# Patient Record
Sex: Female | Born: 1980
Health system: Southern US, Community
[De-identification: ages and names within clinical notes are randomized; demographics above are authoritative.]

## PROBLEM LIST (undated history)

## (undated) DIAGNOSIS — I10 Essential (primary) hypertension: Secondary | ICD-10-CM

## (undated) DIAGNOSIS — R7303 Prediabetes: Secondary | ICD-10-CM

## (undated) DIAGNOSIS — E559 Vitamin D deficiency, unspecified: Secondary | ICD-10-CM

## (undated) DIAGNOSIS — G43909 Migraine, unspecified, not intractable, without status migrainosus: Secondary | ICD-10-CM

## (undated) HISTORY — DX: Vitamin D deficiency, unspecified: E55.9

## (undated) HISTORY — DX: Prediabetes: R73.03

## (undated) HISTORY — DX: Migraine, unspecified, not intractable, without status migrainosus: G43.909

---

## 2002-05-21 ENCOUNTER — Inpatient Hospital Stay (HOSPITAL_COMMUNITY): Admission: AD | Admit: 2002-05-21 | Discharge: 2002-05-21 | Payer: Self-pay | Admitting: Obstetrics and Gynecology

## 2002-10-06 ENCOUNTER — Encounter: Payer: Self-pay | Admitting: Obstetrics and Gynecology

## 2002-10-06 ENCOUNTER — Ambulatory Visit (HOSPITAL_COMMUNITY): Admission: RE | Admit: 2002-10-06 | Discharge: 2002-10-06 | Payer: Self-pay | Admitting: Obstetrics and Gynecology

## 2002-11-21 ENCOUNTER — Inpatient Hospital Stay (HOSPITAL_COMMUNITY): Admission: AD | Admit: 2002-11-21 | Discharge: 2002-11-24 | Payer: Self-pay | Admitting: Obstetrics and Gynecology

## 2002-11-21 ENCOUNTER — Encounter (INDEPENDENT_AMBULATORY_CARE_PROVIDER_SITE_OTHER): Payer: Self-pay

## 2003-02-21 ENCOUNTER — Other Ambulatory Visit: Admission: RE | Admit: 2003-02-21 | Discharge: 2003-02-21 | Payer: Self-pay | Admitting: Obstetrics and Gynecology

## 2004-03-16 ENCOUNTER — Emergency Department (HOSPITAL_COMMUNITY): Admission: EM | Admit: 2004-03-16 | Discharge: 2004-03-16 | Payer: Self-pay | Admitting: Emergency Medicine

## 2004-06-12 ENCOUNTER — Other Ambulatory Visit: Admission: RE | Admit: 2004-06-12 | Discharge: 2004-06-12 | Payer: Self-pay | Admitting: Obstetrics and Gynecology

## 2004-10-30 ENCOUNTER — Other Ambulatory Visit: Admission: RE | Admit: 2004-10-30 | Discharge: 2004-10-30 | Payer: Self-pay | Admitting: Family Medicine

## 2005-07-02 ENCOUNTER — Other Ambulatory Visit: Admission: RE | Admit: 2005-07-02 | Discharge: 2005-07-02 | Payer: Self-pay | Admitting: Obstetrics and Gynecology

## 2007-04-20 ENCOUNTER — Other Ambulatory Visit: Admission: RE | Admit: 2007-04-20 | Discharge: 2007-04-20 | Payer: Self-pay | Admitting: Family Medicine

## 2008-08-31 ENCOUNTER — Emergency Department (HOSPITAL_COMMUNITY): Admission: EM | Admit: 2008-08-31 | Discharge: 2008-08-31 | Payer: Self-pay | Admitting: Emergency Medicine

## 2009-05-09 ENCOUNTER — Other Ambulatory Visit: Admission: RE | Admit: 2009-05-09 | Discharge: 2009-05-09 | Payer: Self-pay | Admitting: Family Medicine

## 2010-02-02 ENCOUNTER — Encounter: Payer: Self-pay | Admitting: Otolaryngology

## 2010-03-31 ENCOUNTER — Other Ambulatory Visit (HOSPITAL_COMMUNITY)
Admission: RE | Admit: 2010-03-31 | Discharge: 2010-03-31 | Disposition: A | Discharge status: Home / Self Care | Payer: Managed Care, Other (non HMO) | Source: Ambulatory Visit | Attending: Internal Medicine | Admitting: Internal Medicine

## 2010-03-31 ENCOUNTER — Other Ambulatory Visit: Payer: Self-pay | Admitting: Physician Assistant

## 2010-03-31 DIAGNOSIS — Z01419 Encounter for gynecological examination (general) (routine) without abnormal findings: Secondary | ICD-10-CM | POA: Insufficient documentation

## 2010-04-19 LAB — URINE MICROSCOPIC-ADD ON

## 2010-04-19 LAB — URINALYSIS, ROUTINE W REFLEX MICROSCOPIC
Bilirubin Urine: NEGATIVE
Glucose, UA: NEGATIVE mg/dL
Leukocytes, UA: NEGATIVE
Protein, ur: NEGATIVE mg/dL
pH: 7.5 (ref 5.0–8.0)

## 2010-04-19 LAB — POCT PREGNANCY, URINE: Preg Test, Ur: NEGATIVE

## 2010-05-30 NOTE — Op Note (Signed)
Christine Fields, Christine Fields                         ACCOUNT NO.:  [id number]   MEDICAL RECORD NO.:  [id number]                   PATIENT TYPE:  INP   LOCATION:  9106                                 [facility]   PHYSICIAN:  Osborn Coho, M.D.                DATE OF BIRTH:  1980/10/25   DATE OF PROCEDURE:  11/21/2002  DATE OF DISCHARGE:                                 OPERATIVE REPORT   PREOPERATIVE DIAGNOSES:  1. Term intrauterine pregnancy.  2. Borderline gestational diabetes.  3. Unexplained fetal tachycardia with decreased variability.   POSTOPERATIVE DIAGNOSES:  1. Term intrauterine pregnancy.  2. Borderline gestational diabetes.  3. Unexplained fetal tachycardia with decreased variability.   1. Presentation left occiput transverse.   PROCEDURE:  Primary low transverse cesarean section via Pfannenstiel skin  incision.   ANESTHESIA:  Epidural.   SURGEON:  Osborn Coho, M.D.   ASSISTANT:  Renaldo Reel. Emilee Hero, C.N.M.   FLUIDS:  2000 mL.   URINE OUTPUT:  300 mL.   ESTIMATED BLOOD LOSS:  800 mL.   COMPLICATIONS:  None.   FINDINGS:  Live female infant with Apgars of 8 at one minute and 9 at five  minutes, [place].  Cord gases 7.28 venous and 7.23 arterial.  Placenta to pathology.   DESCRIPTION OF PROCEDURE:  The patient was taken to the operating room after  risks, benefits, and alternatives were discussed with the patient.  The  patient verbalized understanding and consent signed and witnessed.  The  patient was given a surgical level via the epidural.  The patient was  prepped and draped in the normal sterile fashion.  A Pfannenstiel skin  incision was made and carried down to the underlying layer of fascia with  the Bovie.  The fascia was excised bilaterally in the midline with the Bovie  and the fascial incision extended bilaterally with the Mayo scissors.  Straight Kocher clamps were placed on the superior aspect of the fascial  incision and the rectus  muscle excised from the fascia.  Same was done on  the inferior aspect of the fascial incision.  The muscle was separated in  the midline and the peritoneum entered bluntly.  A bladder flap was created  with the Metzenbaum scissors and bladder retractor placed.  Uterine incision  was made with the scalpel and extended bilaterally with bandage scissors.  The infant was found to be in the left occiput transverse position.  There  was no nuchal cord.  There was still no fluid returning.  The infant's head  was delivered and the oropharynx and nasopharynx were  bulb suctioned.  The  remainder of the infant was then delivered.  The cord was clamped and cut  and the infant was handed to the awaiting pediatricians.  Cord gases were  sent.  Cord bloods were collected.  The placenta was removed via fundal  massage and cefoxitin administered after delivery  of the infant.  The uterus  was exteriorized and cleared of all clots and debris.  The uterine incision  was repaired with 0 Vicryl in a running locked fashion.  A second  imbricating layer was performed.  The uterus was returned to the intra-  abdominal cavity after noting normal bilateral ovaries and fallopian tubes.  The intra-abdominal cavity was irrigated and gutters cleared of any clots  and debris.  The peritoneum was closed with 3-0 chromic after the uterine  incision was noted to be hemostatic.  The fascia was repaired with 0 Vicryl  in a running fashion.  The subcutaneous tissue was irrigated and made  hemostatic with the Bovie.  The skin was closed with staples.  Sponge, lap  and needle counts were correct.  The patient tolerated the procedure well  and was returned to the recovery room in stable condition.                                               Osborn Coho, M.D.    AR/MEDQ  D:  11/21/2002  T:  11/21/2002  Job:  [id number]

## 2010-05-30 NOTE — Discharge Summary (Signed)
NAMEELERI, RUBEN                         ACCOUNT NO.:  [id number]   MEDICAL RECORD NO.:  [id number]                   PATIENT TYPE:  INP   LOCATION:  9106                                 [facility]   PHYSICIAN:  Naima A. Dillard, M.D.              DATE OF BIRTH:  [birth year]   DATE OF ADMISSION:  11/21/2002  DATE OF DISCHARGE:  11/24/2002                                 DISCHARGE SUMMARY   ADMITTING DIAGNOSES:  1. Intrauterine pregnancy at 40-5/7 weeks.  2. Glucose intolerance.  3. Favorable cervix.   DISCHARGE DIAGNOSES:  1. Term intrauterine pregnancy.  2. Borderline gestational diabetes.  3. Unexplained fetal tachycardia with decreased variability.  4. Left occiput transverse.   PROCEDURE:  Primary low transverse cesarean section.   HOSPITAL COURSE:  Ms. Wilfrid Lund is a 30 year old gravida 2, para 0-0-1-0 at 54-  5/7 weeks who presented for induction secondary to borderline gestational  diabetes and favorable cervix on November 21, 2002.  She had an abnormal  value on 1-hour GGT and an abnormal value on a 3-hour GGT with intermittent  elevations of 2-hour postprandial.  She had not monitored diet or CBGs as  instructed during her pregnancy.  Pregnancy was remarkable for (1) glucose  intolerance, (2) questionable last menstrual period.  The patient was  admitted on the morning of November 21, 2002 per Dr. Su Hilt.  Pitocin was  begun per low dose protocol.  Her CBG that morning was 141 after a breakfast  of Frosted Flakes and orange juice.  Artificial rupture of membranes was  accomplished at 11:40 a.m. with no fluid noted.  Cervix was 4, 90%, vertex,  at a -2.  CBG was 104.  Fetal heart rate was showing a baseline of 150s-160s  with decreased long-term variability, some mild to moderate decels after the  epidural which resolved.  Pitocin was held at that time secondary to decel.  Once the decel resolved and observation occurred then the Pitocin was  restarted.  She did  have an epidural.  She did have continued episodes of  decreased beat-to-beat variability with slight fetal tachycardia.  By 1 p.m.  the infant was having some episodes of persistent fetal tachycardia and then  some deceleration, no cervical change was noted, and the plan was made to  proceed with C-section.  Patient was taken to the operating room where a  primary low transverse cesarean section was performed by Dr. Su Hilt under  epidural anesthesia.  Findings were a viable female by the name of Cristal Generous.  Infant weighed 8 pounds 5 ounces.  Placenta was sent to pathology.  Cord pH venous was 7.28, arterial was 7.23.  Apgars were 8 and 9.  CBG prior  to C-section was 118.  By postop day #1 patient was doing well, her  hemoglobin was 10.4 down from 12.4, her incision was clean, dry, and intact,  and she was doing  well.  She was breast-feeding.  She was considering an IUD  for contraception.  The rest of her hospital course was uncomplicated.  By  postop day #3, November 24, 2002, she was deemed to have received full  benefit of her hospital stay and was discharged home.   DISCHARGE INSTRUCTIONS:  Per [facility] handout.   DISCHARGE MEDICATIONS:  1. Motrin 600 mg p.o. q.6h. p.r.n. pain.  2. Tylox one to two p.o. q.3-4h. p.r.n. pain.   DISCHARGE FOLLOWUP:  Discharge followup will occur in 6 weeks [facility].  Patient be interested in IUD at that point.     Renaldo Reel Emilee Hero, C.N.M.                   Naima A. Normand Sloop, M.D.    Leeanne Mannan  D:  11/24/2002  T:  11/24/2002  Job:  [id number]

## 2010-05-30 NOTE — H&P (Signed)
NAMEVERDEAN, MURIN NO.:  [id number]   MEDICAL RECORD NO.:  [id number]                   PATIENT TYPE:  INP   LOCATION:  9168                                 [facility]   PHYSICIAN:  Osborn Coho, M.D.                DATE OF BIRTH:  25-Feb-1980   DATE OF ADMISSION:  11/21/2002  DATE OF DISCHARGE:                                HISTORY & PHYSICAL   Ms. Welge is a 30 year old gravida 2, para 0-0-1-0, at 40-5/7 weeks, who  presents for induction secondary to borderline gestational diabetes and  favorable cervix.  She had an abnormal value on her one-hour GTT and an  abnormal value on her three-hour GTT with intermittent elevations of two-  hour postprandials.  She has not monitored her diet or CBGs as instructed.  Her pregnancy has been remarkable for:   1. Glucose intolerance.  2. Questionable LMP.    PRENATAL LABORATORY DATA:  Blood type is O positive, Rh antibody negative.  VDRL nonreactive.  Rubella titer positive.  Hepatitis B surface antigen  negative.  HIV nonreactive.  Sickle cell test was negative.  GC and  Chlamydia cultures were negative.  Pap was normal.  Glucose challenge was  elevated.  Three-hour GTT had one abnormal value.  AFP was normal.  Hemoglobin upon entering the practice was 12.2.  It was within normal limits  at 27 weeks.  EDC of November 16, 2002, was established by ultrasound at nine  weeks secondary to questionable LMP.  Group B strep culture was negative at  36 weeks as well as cultures were negative.   HISTORY OF PRESENT PREGNANCY:  The patient entered care at approximately 10  weeks.  She was treated for BV at that visit.  She had nausea and vomiting  in early pregnancy, for which she was given Phenergan.  She was placed on  Reglan when Phenergan became ineffective.  She had some spotting at 14  weeks.  She had an ultrasound that showed cervical length adequate.  She had  an ultrasound at 18 weeks that showed  lower uterine segment with an amniotic  band, anatomy normal except an isolated echogenic intracardiac focus.  The  amniotic band was shown not to be interfering with the baby's growth, and no  follow-up was necessary.  She had an elevated one-hour GTT and then had a  three-hour GTT that had an abnormal value.  She did not monitor her sugars  or monitor her diet.  She had one elevated two-hour postprandial and then  had normal postprandials when checked in the office.  Her cervix on exam  yesterday in the office is 3, 90%, vertex at a -2.  The decision was made to  admit her for induction.   PAST OBSTETRICAL HISTORY:  In 1998 she had a therapeutic termination of  pregnancy without problems.   PAST MEDICAL HISTORY:  She was on  Ortho Tri-Cyclen until November 2003.  She  had one UTI when she was in high school.  She has no surgical history.   She has no known medication allergy.   FAMILY HISTORY:  Her paternal grandmother had a heart attack.  She does have  hypertension on both sides of her family and diabetes on both sides of her  family.  Maternal grandfather had oral cancer as a smoker.  Her father had a  stroke.   GENETIC HISTORY:  Unremarkable.  Mother and sister are also smokers.   SOCIAL HISTORY:  The patient is engaged to the father of the baby.  His name  is [employer].  The patient is Hispanic.  Her partner is in [place] in  [employer].  She has two years of college.  She is employed as a  Training and development officer.  Her partner is in [employer].  She denies any alcohol, drug,  or tobacco use during this pregnancy.   PHYSICAL EXAMINATION:  VITAL SIGNS:  Stable.  Initial blood pressure was  130/90, follow-up blood pressure was within normal limits.  The patient is  afebrile.  HEENT:  Within normal limits.  CHEST:  Bilateral breath sounds are clear.  CARDIAC:  Regular rate and rhythm without murmur.  BREASTS:  Soft and nontender.  ABDOMEN:  Fundal height is approximately 38  cm.  Estimated fetal weight is 7-  8 pounds.  Uterine contractions are every six to eight minutes, mild  quality.  Fetal heart rate shows the baseline in the 155-160 with overall  decreased variability.  It is nonreactive, but there are no decelerations.  PELVIC:  Cervix per Dr. Su Hilt on November 20, 2002, was 3, 90%, vertex at a  -2 station.  EXTREMITIES:  Deep tendon reflexes are 2+ without clonus.  There is trace  edema noted.   IMPRESSION:  1. Intrauterine pregnancy at 40-2/7 weeks.  2. Glucose intolerance.  3. Favorable cervix.  4. Currently nonreactive fetal heart rate tracing.   PLAN:  1. Admit to birthing suite per consult with Dr. Su Hilt as attending     physician.  2. Routine physician orders.  3. Plan CBG evaluation with monitoring as needed based upon that.  4. Pitocin per low-dose protocol.  5. M.D. will follow.     Renaldo Reel Emilee Hero, C.N.M.                   Osborn Coho, M.D.    VLL/MEDQ  D:  11/21/2002  T:  11/21/2002  Job:  [id number]

## 2011-05-16 ENCOUNTER — Emergency Department (HOSPITAL_COMMUNITY)
Admission: EM | Admit: 2011-05-16 | Discharge: 2011-05-17 | Disposition: A | Discharge status: Home / Self Care | Payer: PRIVATE HEALTH INSURANCE | Attending: Emergency Medicine | Admitting: Emergency Medicine

## 2011-05-16 ENCOUNTER — Emergency Department (HOSPITAL_COMMUNITY): Payer: PRIVATE HEALTH INSURANCE

## 2011-05-16 ENCOUNTER — Encounter (HOSPITAL_COMMUNITY): Payer: Self-pay | Admitting: Family Medicine

## 2011-05-16 ENCOUNTER — Other Ambulatory Visit: Payer: Self-pay

## 2011-05-16 DIAGNOSIS — R0602 Shortness of breath: Secondary | ICD-10-CM | POA: Insufficient documentation

## 2011-05-16 DIAGNOSIS — I10 Essential (primary) hypertension: Secondary | ICD-10-CM | POA: Insufficient documentation

## 2011-05-16 DIAGNOSIS — R079 Chest pain, unspecified: Secondary | ICD-10-CM | POA: Insufficient documentation

## 2011-05-16 HISTORY — DX: Essential (primary) hypertension: I10

## 2011-05-16 LAB — CBC
HCT: 40.4 % (ref 36.0–46.0)
Hemoglobin: 13.8 g/dL (ref 12.0–15.0)
MCHC: 34.2 g/dL (ref 30.0–36.0)
MCV: 85.1 fL (ref 78.0–100.0)
RBC: 4.75 MIL/uL (ref 3.87–5.11)

## 2011-05-16 LAB — POCT I-STAT TROPONIN I: Troponin i, poc: 0 ng/mL (ref 0.00–0.08)

## 2011-05-16 LAB — D-DIMER, QUANTITATIVE: D-Dimer, Quant: 0.24 ug/mL-FEU (ref 0.00–0.48)

## 2011-05-16 LAB — BASIC METABOLIC PANEL
CO2: 25 mEq/L (ref 19–32)
GFR calc Af Amer: >90 mL/min (ref >90)
GFR calc non Af Amer: >90 mL/min (ref >90)
Glucose, Bld: 131 mg/dL — ABNORMAL HIGH (ref 70–99)

## 2011-05-16 MED ORDER — ASPIRIN 325 MG PO TABS
324.0000 mg | ORAL_TABLET | ORAL | Status: AC
  Administered 2011-05-16: 324 mg via ORAL
  Filled 2011-05-16: qty 1

## 2011-05-16 MED ORDER — ALBUTEROL SULFATE (5 MG/ML) 0.5% IN NEBU
5.0000 mg | INHALATION_SOLUTION | Freq: Once | RESPIRATORY_TRACT | Status: AC
  Administered 2011-05-16: 5 mg via RESPIRATORY_TRACT
  Filled 2011-05-16: qty 1

## 2011-05-16 MED ORDER — KETOROLAC TROMETHAMINE 30 MG/ML IJ SOLN
30.0000 mg | Freq: Once | INTRAMUSCULAR | Status: DC
  Filled 2011-05-16: qty 1

## 2011-05-16 NOTE — ED Notes (Signed)
Patient states she started having chest pain since last night. States pain started while studying. Describes pain as intermittent.

## 2011-05-16 NOTE — ED Notes (Signed)
Pt presented to the Er with c/o CP that started last night, pain in the "center of my chest", feels like a tightness, intermittent pain, 8/10 at this time

## 2011-05-16 NOTE — ED Notes (Signed)
Pt. Able toambulate to the bathroom without assistance and reported no SOB, or distress upon ambulation.

## 2011-05-16 NOTE — ED Provider Notes (Signed)
History     CSN: [id number]  Arrival date & time 05/16/11  2025   First MD Initiated Contact with Patient 05/16/11 2138      10:31 PM HPI Patient reports substernal chest pain that began yesterday at [facility] eating. Describes pain as a squeezing, tightness. States pain is waxing and waning but constant. Reports been under significant amount of stress. Denies shortness of breath, diaphoresis, nausea, vomiting, abdominal pain, back pain, fever, cough. Patient reports significant history of hypertension.  Patient is a 31 y.o. female presenting with chest pain. The history is provided by the patient.  Chest Pain The chest pain began yesterday. Chest pain occurs constantly. The chest pain is unchanged. The severity of the pain is mild. The quality of the pain is described as pressure-like. The pain does not radiate. Chest pain is worsened by certain positions (laying flat). Primary symptoms include shortness of breath. Pertinent negatives for primary symptoms include no fever, no fatigue, no syncope, no cough, no wheezing, no palpitations, no abdominal pain, no nausea, no vomiting, no dizziness and no altered mental status.  Pertinent negatives for associated symptoms include no diaphoresis, no numbness and no weakness. She tried nothing for the symptoms.  Her past medical history is significant for hypertension.  Pertinent negatives for past medical history include no CAD, no CHF, no diabetes, no DVT, no hyperlipidemia, no MI, no PE and no TIA.     Past Medical History  Diagnosis Date  . Hypertension     History reviewed. No pertinent past surgical history.  No family history on file.  History  Substance Use Topics  . Smoking status: Never Smoker   . Smokeless tobacco: Not on file  . Alcohol Use: No    OB History    Grav Para Term Preterm Abortions TAB SAB Ect Mult Living                  Review of Systems  Constitutional: Negative for fever, diaphoresis and fatigue.  HENT:  Negative for neck pain.   Respiratory: Positive for shortness of breath. Negative for cough and wheezing.   Cardiovascular: Positive for chest pain. Negative for palpitations, leg swelling and syncope.  Gastrointestinal: Negative for nausea, vomiting and abdominal pain.  Neurological: Negative for dizziness, weakness, numbness and headaches.  Psychiatric/Behavioral: Negative for altered mental status.  All other systems reviewed and are negative.    Allergies  Review of patient's allergies indicates no known allergies.  Home Medications   Current Outpatient Rx  Name Route Sig Dispense Refill  . ASPIRIN EFFERVESCENT 325 MG PO TBEF Oral Take 325 mg by mouth every 6 (six) hours as needed.      BP 157/99  Pulse 87  Temp(Src) 98.1 F (36.7 C) (Oral)  Resp 18  SpO2 100%  Physical Exam  Vitals reviewed. Constitutional: She is oriented to person, place, and time. Vital signs are normal. She appears well-developed and well-nourished. No distress.  HENT:  Head: Normocephalic and atraumatic.  Eyes: Conjunctivae are normal. Pupils are equal, round, and reactive to light.  Neck: Normal range of motion. Neck supple.  Cardiovascular: Normal rate, regular rhythm and normal heart sounds.  Exam reveals no gallop and no friction rub.   No murmur heard. Pulmonary/Chest: Effort normal and breath sounds normal. She has no wheezes. She has no rhonchi. She has no rales. She exhibits no tenderness.  Abdominal: Soft. Bowel sounds are normal. She exhibits no distension and no mass. There is no tenderness. There is  no rebound and no guarding.  Musculoskeletal: Normal range of motion.  Neurological: She is alert and oriented to person, place, and time.  Skin: Skin is warm and dry. No rash noted. No erythema. No pallor.  Psychiatric: She has a normal mood and affect. Her behavior is normal.    ED Course  Procedures  Results for orders placed during the hospital encounter of 05/16/11  CBC       Component Value Range   WBC 13.6 (*) 4.0 - 10.5 (K/uL)   RBC 4.75  3.87 - 5.11 (MIL/uL)   Hemoglobin 13.8  12.0 - 15.0 (g/dL)   HCT 16.1  09.6 - 04.5 (%)   MCV 85.1  78.0 - 100.0 (fL)   MCH 29.1  26.0 - 34.0 (pg)   MCHC 34.2  30.0 - 36.0 (g/dL)   RDW 40.9  81.1 - 91.4 (%)   Platelets 241  150 - 400 (K/uL)  BASIC METABOLIC PANEL      Component Value Range   Sodium 136  135 - 145 (mEq/L)   Potassium 3.6  3.5 - 5.1 (mEq/L)   Chloride 101  96 - 112 (mEq/L)   CO2 25  19 - 32 (mEq/L)   Glucose, Bld 131 (*) 70 - 99 (mg/dL)   BUN 9  6 - 23 (mg/dL)   Creatinine, Ser 7.82  0.50 - 1.10 (mg/dL)   Calcium 9.3  8.4 - 95.6 (mg/dL)   GFR calc non Af Amer >90  >90 (mL/min)   GFR calc Af Amer >90  >90 (mL/min)  TROPONIN I      Component Value Range   Troponin I <0.30  <0.30 (ng/mL)  POCT I-STAT TROPONIN I      Component Value Range   Troponin i, poc 0.00  0.00 - 0.08 (ng/mL)   Comment 3           D-DIMER, QUANTITATIVE      Component Value Range   D-Dimer, Quant 0.24  0.00 - 0.48 (ug/mL-FEU)  POCT I-STAT TROPONIN I      Component Value Range   Troponin i, poc 0.00  0.00 - 0.08 (ng/mL)   Comment 3            Chest Portable 1 View  05/16/2011  *RADIOLOGY REPORT*  Clinical Data: Chest pain.  PORTABLE CHEST - 1 VIEW  Comparison: Chest radiograph performed 03/16/2004  Findings: The lungs are well-aerated and clear.  There is no evidence of focal opacification, pleural effusion or pneumothorax. Pulmonary vascularity is at the upper limits of normal.  The cardiomediastinal silhouette is borderline normal in size.  No acute osseous abnormalities are seen.  IMPRESSION: No acute cardiopulmonary process seen.  Original Report Authenticated By: Tonia Ghent, M.D.     MDM   Patient had 2 negative cardiac markers. Will discharge with diagnosis of chest pain. Low suspicion for ACS. D-dimer is negative for PE. Otherwise patient followup with primary care physician if pain continues. Patient and mother. Plan  and are ready for discharge.       Thomasene Lot, PA-C 05/17/11 0214

## 2011-05-17 ENCOUNTER — Other Ambulatory Visit: Payer: Self-pay

## 2011-05-17 LAB — POCT I-STAT TROPONIN I: Troponin i, poc: 0 ng/mL (ref 0.00–0.08)

## 2011-05-17 NOTE — Discharge Instructions (Signed)
Chest Pain (Nonspecific) It is often hard to give a specific diagnosis for the cause of chest pain. There is always a chance that your pain could be related to something serious, such as a heart attack or a blood clot in the lungs. You need to follow up with your caregiver for further evaluation. CAUSES   Heartburn.   Pneumonia or bronchitis.   Anxiety or stress.   Inflammation around your heart (pericarditis) or lung (pleuritis or pleurisy).   A blood clot in the lung.   A collapsed lung (pneumothorax). It can develop suddenly on its own (spontaneous pneumothorax) or from injury (trauma) to the chest.   Shingles infection (herpes zoster virus).  The chest wall is composed of bones, muscles, and cartilage. Any of these can be the source of the pain.  The bones can be bruised by injury.   The muscles or cartilage can be strained by coughing or overwork.   The cartilage can be affected by inflammation and become sore (costochondritis).  DIAGNOSIS  Lab tests or other studies, such as X-rays, electrocardiography, stress testing, or cardiac imaging, may be needed to find the cause of your pain.  TREATMENT   Treatment depends on what may be causing your chest pain. Treatment may include:   Acid blockers for heartburn.   Anti-inflammatory medicine.   Pain medicine for inflammatory conditions.   Antibiotics if an infection is present.   You may be advised to change lifestyle habits. This includes stopping smoking and avoiding alcohol, caffeine, and chocolate.   You may be advised to keep your head raised (elevated) when sleeping. This reduces the chance of acid going backward from your stomach into your esophagus.   Most of the time, nonspecific chest pain will improve within 2 to 3 days with rest and mild pain medicine.  HOME CARE INSTRUCTIONS   If antibiotics were prescribed, take your antibiotics as directed. Finish them even if you start to feel better.   For the next few  days, avoid physical activities that bring on chest pain. Continue physical activities as directed.   Do not smoke.   Avoid drinking alcohol.   Only take over-the-counter or prescription medicine for pain, discomfort, or fever as directed by your caregiver.   Follow your caregiver's suggestions for further testing if your chest pain does not go away.   Keep any follow-up appointments you made. If you do not go to an appointment, you could develop lasting (chronic) problems with pain. If there is any problem keeping an appointment, you must call to reschedule.  SEEK MEDICAL CARE IF:   You think you are having problems from the medicine you are taking. Read your medicine instructions carefully.   Your chest pain does not go away, even after treatment.   You develop a rash with blisters on your chest.  SEEK IMMEDIATE MEDICAL CARE IF:   You have increased chest pain or pain that spreads to your arm, neck, jaw, back, or abdomen.   You develop shortness of breath, an increasing cough, or you are coughing up blood.   You have severe back or abdominal pain, feel nauseous, or vomit.   You develop severe weakness, fainting, or chills.   You have a fever.  THIS IS AN EMERGENCY. Do not wait to see if the pain will go away. Get medical help at once. Call your local emergency services (911 in [place] Do not drive yourself to the hospital. MAKE SURE YOU:   Understand these instructions.     Will watch your condition.   Will get help right away if you are not doing well or get worse.  Document Released: 10/08/2004 Document Revised: 12/18/2010 Document Reviewed: 08/04/2007 [employer] Patient Information 2012 [employer]. 

## 2011-05-17 NOTE — ED Provider Notes (Signed)
Medical screening examination/treatment/procedure(s) were performed by non-physician practitioner and as supervising physician I was immediately available for consultation/collaboration.  Toy Baker, MD 05/17/11 1048

## 2012-11-03 ENCOUNTER — Other Ambulatory Visit (HOSPITAL_COMMUNITY)
Admission: RE | Admit: 2012-11-03 | Discharge: 2012-11-03 | Disposition: A | Discharge status: Home / Self Care | Payer: Medicaid Other | Source: Ambulatory Visit | Attending: Nurse Practitioner | Admitting: Nurse Practitioner

## 2012-11-03 ENCOUNTER — Other Ambulatory Visit: Payer: Self-pay | Admitting: Nurse Practitioner

## 2012-11-03 DIAGNOSIS — Z01419 Encounter for gynecological examination (general) (routine) without abnormal findings: Secondary | ICD-10-CM | POA: Insufficient documentation

## 2012-11-03 DIAGNOSIS — Z1151 Encounter for screening for human papillomavirus (HPV): Secondary | ICD-10-CM | POA: Insufficient documentation

## 2014-02-12 ENCOUNTER — Encounter: Payer: Self-pay | Admitting: Skilled Nursing Facility1

## 2014-02-12 ENCOUNTER — Encounter: Payer: Medicaid Other | Attending: Family Medicine | Admitting: Skilled Nursing Facility1

## 2014-02-12 VITALS — Ht 67.0 in | Wt 253.0 lb

## 2014-02-12 DIAGNOSIS — R7309 Other abnormal glucose: Secondary | ICD-10-CM | POA: Insufficient documentation

## 2014-02-12 DIAGNOSIS — Z713 Dietary counseling and surveillance: Secondary | ICD-10-CM | POA: Diagnosis not present

## 2014-02-12 DIAGNOSIS — R7303 Prediabetes: Secondary | ICD-10-CM

## 2014-02-12 NOTE — Progress Notes (Signed)
  Medical Nutrition Therapy:  Appt start time: 9:15 end time:  10:15.   Assessment:  Primary concerns today: Referred for Pre-diabetes. Patient's A1C is at 6.1. Patient believes she has lost weight since November/december when she was 257 pounds. Patient states she was told she should see a dietitian due to her blood sugar being in the prediabetic range and her having a  family hx of diabetes. 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 Patient states when she eats badly it is a lot but she trys to pick good food most of the time. Patient states she wants help to eat well, read labels, and lose wight. Her motivation is her high BP and migraines and she believes these symptoms are from her excess weight. Patient states she has tried weight watchers 4 years ago but it did not work because she did not want to count points and did not understand the point system; she fouind it to be complicated. Patient states her current weight is usual for her adult age. Patient states she has a 36 year old which also makes it difficult to find time to workout and eat right. Patient states she Graduates this may. Pateint states she  Feels rushed through meals and usually eats on the go in the car. She claims Tuesdays and Thursdays are her busiest days. Sleep: varies, about 5 hours of sleep, not restful. She is inquisitive and seems motivated.    Preferred Learning Style:   No preference indicated   Learning Readiness:   Ready   MEDICATIONS: See List   DIETARY INTAKE:  Usual eating pattern includes 3 meals and 2-3 snacks per day.  Everyday foods include oranges.  Avoided foods include cheese.    24-hr recall:  B ( AM): cold cereal------panera bread----sometimes bacon Snk ( AM): chips, snickers, M and M's---fruit L ( PM): chic fil a salad-----turkey wrap from [employer]-------leftovers-----pizza   Snk ( PM): Maxy B's cakes (most days a week)-----yogurt D ( PM):  meatloaf----fried chicken------fried fish, pasta salad, string beans with bacon Snk ( PM): something sweet Beverages: fruit juice, water, cool aid with sugar  Usual physical activity: ADL's  Estimated energy needs: 1800 calories 200 g carbohydrates 135 g protein 50 g fat  Progress Towards Goal(s):  In progress.   Nutritional Diagnosis:  [place]-3.3 Overweight/obesity As related to overconsumption of cake/sweets.  As evidenced by pateint report and a BMI of 39.63.    Intervention:  Nutrition Counseling for pre-diabetes. Dietitian educated the patient on carbohydrate containing foods, proper portion sizes, and the importance of physical activity as well as sleep.  Goals:  -Try to put the TV on sleep every night and keep your dog in their own bed -Be aware of what your eating and why you are eating -Replace the nightly sweet with a piece of fruit -Beans, fruit and milk have carbohydrates -Try to cut back on frying your foods -Try your broccoli with olive oil -Do not buy cake make a cake from scratch -Try to fit in movement every day  Teaching Method Utilized:  Visual Auditory  Handouts given during visit include:  Low sodium seasoning options  Barriers to learning/adherence to lifestyle change: school.  Demonstrated degree of understanding via:  Teach Back   Monitoring/Evaluation:  Dietary intake, A1C, exercise, and body weight prn.

## 2014-02-12 NOTE — Patient Instructions (Addendum)
-  Try to put the TV on sleep every night and keep your dog in their own bed -Be aware of what your eating and why you are eating -Replace the nightly sweet with a piece of fruit -Beans, fruit and milk have carbohydrates -Try to cut back on frying your foods -Try your broccoli with olive oil -Do not buy cake make a cake from scratch -Try to fit in movement every day

## 2014-05-11 ENCOUNTER — Ambulatory Visit: Payer: Managed Care, Other (non HMO) | Admitting: Skilled Nursing Facility1

## 2016-08-17 ENCOUNTER — Ambulatory Visit (HOSPITAL_COMMUNITY)
Admission: EM | Admit: 2016-08-17 | Discharge: 2016-08-17 | Disposition: A | Discharge status: Home / Self Care | Payer: BLUE CROSS/BLUE SHIELD | Attending: Internal Medicine | Admitting: Internal Medicine

## 2016-08-17 ENCOUNTER — Encounter (HOSPITAL_COMMUNITY): Payer: Self-pay | Admitting: Emergency Medicine

## 2016-08-17 DIAGNOSIS — M62830 Muscle spasm of back: Secondary | ICD-10-CM

## 2016-08-17 DIAGNOSIS — T148XXA Other injury of unspecified body region, initial encounter: Secondary | ICD-10-CM | POA: Diagnosis not present

## 2016-08-17 MED ORDER — NAPROXEN 500 MG PO TABS: 500.0000 mg | ORAL_TABLET | Freq: Two times a day (BID) | ORAL | 0 refills | Status: AC

## 2016-08-17 MED ORDER — CYCLOBENZAPRINE HCL 10 MG PO TABS
5.0000 mg | ORAL_TABLET | Freq: Two times a day (BID) | ORAL | 0 refills | Status: DC | PRN
Start: 2016-08-17 — End: 2017-03-02

## 2016-08-17 NOTE — ED Provider Notes (Signed)
CSN: [id number]     Arrival date & time 08/17/16  [id number] History   None    Chief Complaint  Patient presents with  . Back Pain   (Consider location/radiation/quality/duration/timing/severity/associated sxs/prior Treatment) 36 year old female with past medical history of hypertension comes in for a day history of back pain. No injury that she is now off. Denies numbness, tingling, loss of bladder or bowel control. She has lost a lot of weight the past year from going to the gym, and now works out daily. States pain is mostly on the left, around the mid back. She experiences muscle spasms that is worse than the pain. Pain is not constant, and only occurs with movement. She's been taking ibuprofen 800 mg on and off for the pain, which allows her to exercise as needed, but pain returns afterwards. Denies history of back pain, urinary symptoms such as frequency, dysuria, hematuria. Normal bowel movements, denies history of constipation, diarrhea.      Past Medical History:  Diagnosis Date  . Hypertension    Past Surgical History:  Procedure Laterality Date  . CESAREAN SECTION     Family History  Problem Relation Age of Onset  . Diabetes Other   . Hypertension Other   . Stroke Other    Social History  Substance Use Topics  . Smoking status: Never Smoker  . Smokeless tobacco: Not on file  . Alcohol use No   OB History    No data available     Review of Systems  Reason unable to perform ROS: See HPI as above.    Allergies  Patient has no known allergies.  Home Medications   Prior to Admission medications   Medication Sig Start Date End Date Taking? Authorizing Provider  ibuprofen (ADVIL,MOTRIN) 200 MG tablet Take 200 mg by mouth every 6 (six) hours as needed.   Yes [provider]  aspirin-sod bicarb-citric acid (ALKA-SELTZER) 325 MG TBEF Take 325 mg by mouth every 6 (six) hours as needed.    [provider]  cyclobenzaprine (FLEXERIL) 10 MG tablet Take 0.5-1  tablets (5-10 mg total) by mouth 2 (two) times daily as needed for muscle spasms. 08/17/16   Cathie HoopsYu, Amy V, PA-C  hydrochlorothiazide (HYDRODIURIL) 25 MG tablet Take 25 mg by mouth daily.    [provider]  naproxen (NAPROSYN) 500 MG tablet Take 1 tablet (500 mg total) by mouth 2 (two) times daily. 08/17/16 08/27/16  Cathie HoopsYu, Amy V, PA-C  rizatriptan (MAXALT) 10 MG tablet Take 10 mg by mouth as needed for migraine. May repeat in 2 hours if needed    [provider]   Meds Ordered and Administered this Visit  Medications - No data to display  Pulse 89   Temp 98.5 F (36.9 C) (Oral)   Resp 18   SpO2 97%  No data found.   Physical Exam  Constitutional: She is oriented to person, place, and time. She appears well-developed and well-nourished. No distress.  HENT:  Head: Normocephalic and atraumatic.  Eyes: Pupils are equal, round, and reactive to light. Conjunctivae are normal.  Cardiovascular: Normal rate and regular rhythm.  Exam reveals no gallop and no friction rub.   No murmur heard. Pulmonary/Chest: Effort normal and breath sounds normal. She has no wheezes. She has no rales.  Musculoskeletal:  No tenderness on palpation of the midline with bilateral packs. Full range of motion of back, but pain and spasm was elicited during these movement. No tenderness to palpation of the hips.  Full range of motion. She is normal and equal bilaterally. Sensation intact and equal bilaterally.  Neurological: She is alert and oriented to person, place, and time.  Skin: Skin is warm and dry.    Urgent Care Course     Procedures (including critical care time)  Labs Review Labs Reviewed - No data to display  Imaging Review No results found.      MDM   1. Muscle spasm of back   2. Muscle strain    Discussed with patient history and exam most consistent with muscle strain with spasms. Start NSAID as directed for pain and inflammation. Muscle relaxant as needed. Ice/heat compresses.  Discussed with patient strain can take up to 2-3 weeks to resolve, but should be getting better each day/week. Given patient is reactive at the gym, discussed lowering load of activity, and stretches after workout to prevent further injury of the muscles. Patient to monitor for worsening of symptoms, numbness/tingling, loss of bladder or bowel control, to go to the ED for further evaluation.    Belinda Fisher, PA-C 08/17/16 1045

## 2016-08-17 NOTE — Discharge Instructions (Signed)
Start Naproxen as directed for pain and inflammation. Muscle relaxant as needed. Ice/heat compresses. This can take up to 2-3 weeks to resolve, but should be getting better each day/week. Activity as tolerated, stretches after the gym. Monitor for worsening or changes in symptoms, numbness/tingling, loss of bladder or bowel control, to go to the ED for further evaluation.

## 2016-08-17 NOTE — ED Triage Notes (Signed)
Back pain for 8 days.  No known injury.  Reports she does work out often.  Left mid-back pain to the midline, then spasms with movement.  Patient is taking ibuprofen

## 2016-08-18 ENCOUNTER — Other Ambulatory Visit (HOSPITAL_COMMUNITY)
Admission: RE | Admit: 2016-08-18 | Discharge: 2016-08-18 | Disposition: A | Discharge status: Home / Self Care | Payer: BLUE CROSS/BLUE SHIELD | Source: Ambulatory Visit | Attending: Obstetrics and Gynecology | Admitting: Obstetrics and Gynecology

## 2016-08-18 ENCOUNTER — Other Ambulatory Visit: Payer: Self-pay | Admitting: Obstetrics and Gynecology

## 2016-08-18 DIAGNOSIS — Z124 Encounter for screening for malignant neoplasm of cervix: Secondary | ICD-10-CM | POA: Diagnosis not present

## 2016-08-20 LAB — HM PAP SMEAR: HM Pap smear: NORMAL

## 2016-08-20 LAB — CYTOLOGY - PAP
DIAGNOSIS: NEGATIVE
HPV (WINDOPATH): NOT DETECTED

## 2017-03-02 ENCOUNTER — Encounter: Payer: Self-pay | Admitting: Medical

## 2017-03-02 ENCOUNTER — Ambulatory Visit: Payer: BLUE CROSS/BLUE SHIELD | Admitting: Medical

## 2017-03-02 VITALS — BP 150/86 | HR 102 | Ht 66.0 in | Wt 234.0 lb

## 2017-03-02 DIAGNOSIS — I10 Essential (primary) hypertension: Secondary | ICD-10-CM | POA: Diagnosis not present

## 2017-03-02 DIAGNOSIS — G43909 Migraine, unspecified, not intractable, without status migrainosus: Secondary | ICD-10-CM | POA: Diagnosis not present

## 2017-03-02 DIAGNOSIS — I1A0 Resistant hypertension: Secondary | ICD-10-CM

## 2017-03-02 HISTORY — DX: Resistant hypertension: I1A.0

## 2017-03-02 MED ORDER — HYDROCHLOROTHIAZIDE 12.5 MG PO TABS: 12.5000 mg | ORAL_TABLET | Freq: Every day | ORAL | 2 refills | Status: DC

## 2017-03-02 MED ORDER — AMLODIPINE BESYLATE 10 MG PO TABS: 10.0000 mg | ORAL_TABLET | Freq: Every day | ORAL | 2 refills | Status: DC

## 2017-03-02 MED FILL — AMLODIPINE BESYLATE 10 MG T: 10 | 30 days supply | Qty: 30 | Fill #0

## 2017-03-02 MED FILL — HYDROCHLOROTHIAZIDE 12.5 MG: 12.5 | 30 days supply | Qty: 30 | Fill #0

## 2017-03-02 NOTE — Patient Instructions (Signed)
Recommendations  Continue Amlodipine 5mg  tablet daily in the morning (1/2 tablet of the 10mg )  Begin Hydrochlorothiazide 12.5mg  tablet daily in the morning  morning your blood pressures 1-2 times weekly  The goal is 120/70.  High blood pressure is >140/90  Continue exercise, healthy diet  Follow up in 1 month    Hypertension Hypertension is another name for high blood pressure. High blood pressure forces your heart to work harder to pump blood. This can cause problems over time. There are two numbers in a blood pressure reading. There is a top number (systolic) over a bottom number (diastolic). It is best to have a blood pressure below 120/80. Healthy choices can help lower your blood pressure. You may need medicine to help lower your blood pressure if:  Your blood pressure cannot be lowered with healthy choices.  Your blood pressure is higher than 130/80.  Follow these instructions at home: Eating and drinking  If directed, follow the DASH eating plan. This diet includes: ? Filling half of your plate at each meal with fruits and vegetables. ? Filling one quarter of your plate at each meal with whole grains. Whole grains include whole wheat pasta, brown rice, and whole grain bread. ? Eating or drinking low-fat dairy products, such as skim milk or low-fat yogurt. ? Filling one quarter of your plate at each meal with low-fat (lean) proteins. Low-fat proteins include fish, skinless chicken, eggs, beans, and tofu. ? Avoiding fatty meat, cured and processed meat, or chicken with skin. ? Avoiding premade or processed food.  Eat less than 1,500 mg of salt (sodium) a day.  Limit alcohol use to no more than 1 drink a day for nonpregnant women and 2 drinks a day for men. One drink equals 12 oz of beer, 5 oz of wine, or 1 oz of hard liquor. Lifestyle  Work with your doctor to stay at a healthy weight or to lose weight. Ask your doctor what the best weight is for you.  Get at least 30  minutes of exercise that causes your heart to beat faster (aerobic exercise) most days of the week. This may include walking, swimming, or biking.  Get at least 30 minutes of exercise that strengthens your muscles (resistance exercise) at least 3 days a week. This may include lifting weights or pilates.  Do not use any products that contain nicotine or tobacco. This includes cigarettes and e-cigarettes. If you need help quitting, ask your doctor.  Check your blood pressure at home as told by your doctor.  Keep all follow-up visits as told by your doctor. This is important. Medicines  Take over-the-counter and prescription medicines only as told by your doctor. Follow directions carefully.  Do not skip doses of blood pressure medicine. The medicine does not work as well if you skip doses. Skipping doses also puts you at risk for problems.  Ask your doctor about side effects or reactions to medicines that you should watch for. Contact a doctor if:  You think you are having a reaction to the medicine you are taking.  You have headaches that keep coming back (recurring).  You feel dizzy.  You have swelling in your ankles.  You have trouble with your vision. Get help right away if:  You get a very bad headache.  You start to feel confused.  You feel weak or numb.  You feel faint.  You get very bad pain in your: ? Chest. ? Belly (abdomen).  You throw up (vomit) more  than once.  You have trouble breathing. Summary  Hypertension is another name for high blood pressure.  Making healthy choices can help lower blood pressure. If your blood pressure cannot be controlled with healthy choices, you may need to take medicine. This information is not intended to replace advice given to you by your health care provider. Make sure you discuss any questions you have with your health care provider. Document Released: 06/17/2007 Document Revised: 11/27/2015 Document Reviewed:  11/27/2015 Elsevier Interactive Patient Education  [employer]2018 [employer] Inc.

## 2017-03-02 NOTE — Progress Notes (Signed)
Subjective: Chief Complaint  Patient presents with  . New Patient (Initial Visit)    new pt, get est, starting new job , b/p running high    Here for new patient to establish care and for high BP.   Starting new job at [employer].  Failed BP check twice.  Was advised to have the blood pressure evaluated.  She notes hx/o elevated BP readings for 2 years.   Has been on medication for about a year for BP.  She was initially prescribed Amlodipine and HCTZ, but since the fluid pill made her urinate all the time, she stopped it.   Currently just taking Amlodipine.    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    No chest pain, no DOE, no SOB, no edema.   exercise - lost 57lb from 11/2015 - 06/2016.   Exercises regularly.  Trying to eat healthy.  Does circuit training, swimming, HIT class.    Past Medical History:  Diagnosis Date  . Hypertension    Current Outpatient Medications on File Prior to Visit  Medication Sig Dispense Refill  . amLODipine (NORVASC) 5 MG tablet Take 5 mg by mouth daily.    Marland Kitchen. levonorgestrel (MIRENA, 52 MG,) 20 MCG/24HR IUD Mirena 20 mcg/24 hr (5 years) intrauterine device  Take 1 insert by intrauterine route as directed.    . rizatriptan (MAXALT) 10 MG tablet Take 10 mg by mouth as needed for migraine. May repeat in 2 hours if needed     No current facility-administered medications on file prior to visit.    Family History  Problem Relation Age of Onset  . Diabetes Father   . Hypertension Father   . Stroke Father   . Hypertension Maternal Grandmother   . Diabetes Maternal Grandfather     ROS as in subjective   Objective: BP (!) 150/86   Pulse (!) 102   Ht 5\' 6"  (1.676 m)   Wt 234 lb (106.1 kg)   LMP 02/11/2017   SpO2 99%   BMI 37.77 kg/m   Wt Readings from Last 3 Encounters:  03/02/17 234 lb (106.1 kg)  02/12/14 253 lb (114.8 kg)   BP Readings from Last 3 Encounters:  03/02/17 (!) 150/86  08/17/16 (!) 152/90  05/17/11 145/90   General appearance: alert, no  distress, WD/WN, AA female Neck: supple, no lymphadenopathy, no thyromegaly, no masses, no bruits Heart: RRR, normal S1, S2, no murmurs Lungs: CTA bilaterally, no wheezes, rhonchi, or rales Ext: no edema Pulses: 2+ symmetric, upper and lower extremities, normal cap refill Neuro: cn2-12 intact, nonfocal exam     Adult ECG Report  Indication: HTN  Rate: 76 bpm  Rhythm: normal sinus rhythm  QRS Axis: 61 degrees  PR Interval: 160ms  QRS Duration: 88ms  QTc: 429ms  Conduction Disturbances: none  Other Abnormalities: possible atrial enlargement  Patient's cardiac risk factors are: hypertension.  EKG comparison: 2013  Narrative Interpretation: possible atrial enlargement   Assessment: Encounter Diagnoses  Name Primary?  . Essential hypertension, benign   . Migraine without status migrainosus, not intractable, unspecified migraine type Yes    Plan: Hypertension: Evidence of target organ damage: none Medication: c/t Amlodipine 5mg  (1/2 tablet of the 10mg  tablet daily) and begin HCTZ.  Discussed risks/benefits of medications . F/u 63mo.  Letter give for work. Continue dietary measures. Dietary sodium restriction. Regular aerobic exercise. Check blood pressures 2 times weekly and record. F/u 63mo  Migraines - intermittent, not frequent.    Can c/t Maxalt prn,  avoid triggers.  Jordon was seen today for new patient (initial visit).  Diagnoses and all orders for this visit:  Migraine without status migrainosus, not intractable, unspecified migraine type  Essential hypertension, benign

## 2017-03-03 ENCOUNTER — Ambulatory Visit: Payer: Self-pay | Admitting: Medical

## 2017-04-12 ENCOUNTER — Encounter: Payer: Self-pay | Admitting: Medical

## 2017-04-12 ENCOUNTER — Ambulatory Visit (INDEPENDENT_AMBULATORY_CARE_PROVIDER_SITE_OTHER): Payer: 59 | Admitting: Medical

## 2017-04-12 VITALS — BP 138/88 | HR 87 | Temp 97.9°F | Ht 66.0 in | Wt 242.8 lb

## 2017-04-12 DIAGNOSIS — G43909 Migraine, unspecified, not intractable, without status migrainosus: Secondary | ICD-10-CM

## 2017-04-12 DIAGNOSIS — I10 Essential (primary) hypertension: Secondary | ICD-10-CM | POA: Diagnosis not present

## 2017-04-12 MED ORDER — HYDROCHLOROTHIAZIDE 12.5 MG PO TABS: 12.5000 mg | ORAL_TABLET | Freq: Every day | ORAL | 3 refills | Status: DC

## 2017-04-12 MED ORDER — AMLODIPINE BESYLATE 5 MG PO TABS: 5.0000 mg | ORAL_TABLET | Freq: Every day | ORAL | 3 refills | Status: DC

## 2017-04-12 MED ORDER — RIZATRIPTAN BENZOATE 10 MG PO TABS
10.0000 mg | ORAL_TABLET | ORAL | 2 refills | Status: DC | PRN
Start: 2017-04-12 — End: 2017-10-12

## 2017-04-12 MED FILL — AMLODIPINE BESYLATE 5 MG TA: 5 | 90 days supply | Qty: 90 | Fill #0

## 2017-04-12 MED FILL — HYDROCHLOROTHIAZIDE 12.5 MG: 12.5 | 90 days supply | Qty: 90 | Fill #0

## 2017-04-12 MED FILL — RIZATRIPTAN BENZOATE 10 MG: 10 | 30 days supply | Qty: 10 | Fill #0

## 2017-04-12 NOTE — Progress Notes (Signed)
Subjective: Chief Complaint  Patient presents with  . Follow-up    blood pressure    Here for BP f/u.  I saw her as a new patient 03/02/17.  Last visit we continued Amlodipine 5mg  daily and added HCTZ at 12.5mg  daily for better efficacy.   She has been checking BPs.   Getting 120-130 SBP, 80s DBP.  No chest pain, no DOE, no SOB, no edema.   exercise - lost 57lb from 11/2015 - 06/2016.   Exercises regularly.  Trying to eat healthy.  Does circuit training, swimming, HIT class.    Past Medical History:  Diagnosis Date  . Hypertension    Current Outpatient Medications on File Prior to Visit  Medication Sig Dispense Refill  . amLODipine (NORVASC) 10 MG tablet Take 1 tablet (10 mg total) by mouth daily. 30 tablet 2  . levonorgestrel (MIRENA, 52 MG,) 20 MCG/24HR IUD Mirena 20 mcg/24 hr (5 years) intrauterine device  Take 1 insert by intrauterine route as directed.     No current facility-administered medications on file prior to visit.    Family History  Problem Relation Age of Onset  . Diabetes Father   . Hypertension Father   . Stroke Father   . Hypertension Maternal Grandmother   . Diabetes Maternal Grandfather     ROS as in subjective   Objective: BP 138/88 (BP Location: Right Arm, Patient Position: Sitting, Cuff Size: Large)   Pulse 87   Temp 97.9 F (36.6 C) (Oral)   Ht 5\' 6"  (1.676 m)   Wt 242 lb 12.8 oz (110.1 kg)   SpO2 98%   BMI 39.19 kg/m   Wt Readings from Last 3 Encounters:  04/12/17 242 lb 12.8 oz (110.1 kg)  03/02/17 234 lb (106.1 kg)  02/12/14 253 lb (114.8 kg)   BP Readings from Last 3 Encounters:  04/12/17 138/88  03/02/17 (!) 150/86  08/17/16 (!) 152/90   General appearance: alert, no distress, WD/WN, AA female Heart: RRR, normal S1, S2, no murmurs Lungs: CTA bilaterally, no wheezes, rhonchi, or rales Ext: no edema Pulses: 2+ symmetric, upper and lower extremities, normal cap refill     Assessment: Encounter Diagnoses  Name Primary?  .  Essential hypertension, benign Yes  . Migraine without status migrainosus, not intractable, unspecified migraine type       Plan: BP improved.   C/t same medications below, c/t regular exercise, healthy diet.    Plan physical fasting visit in the next 3-4 months.  Sue Lushndrea was seen today for follow-up.  Diagnoses and all orders for this visit:  Essential hypertension, benign  Migraine without status migrainosus, not intractable, unspecified migraine type  Other orders -     hydrochlorothiazide (HYDRODIURIL) 12.5 MG tablet; Take 1 tablet (12.5 mg total) by mouth daily. -     rizatriptan (MAXALT) 10 MG tablet; Take 1 tablet (10 mg total) by mouth as needed for migraine. May repeat in 2 hours if needed -     amLODipine (NORVASC) 5 MG tablet; Take 1 tablet (5 mg total) by mouth daily.

## 2017-05-24 ENCOUNTER — Telehealth: Payer: Self-pay | Admitting: Medical

## 2017-05-24 ENCOUNTER — Ambulatory Visit (INDEPENDENT_AMBULATORY_CARE_PROVIDER_SITE_OTHER): Payer: 59 | Admitting: Medical

## 2017-05-24 ENCOUNTER — Encounter: Payer: Self-pay | Admitting: Medical

## 2017-05-24 VITALS — BP 136/98 | HR 89 | Temp 98.0°F | Ht 67.0 in | Wt 248.0 lb

## 2017-05-24 DIAGNOSIS — Z975 Presence of (intrauterine) contraceptive device: Secondary | ICD-10-CM | POA: Diagnosis not present

## 2017-05-24 DIAGNOSIS — I10 Essential (primary) hypertension: Secondary | ICD-10-CM | POA: Diagnosis not present

## 2017-05-24 DIAGNOSIS — Z0000 Encounter for general adult medical examination without abnormal findings: Secondary | ICD-10-CM | POA: Diagnosis not present

## 2017-05-24 DIAGNOSIS — E669 Obesity, unspecified: Secondary | ICD-10-CM | POA: Diagnosis not present

## 2017-05-24 LAB — POCT URINALYSIS DIP (PROADVANTAGE DEVICE)
BILIRUBIN UA: NEGATIVE
BILIRUBIN UA: NEGATIVE mg/dL
GLUCOSE UA: NEGATIVE mg/dL
Leukocytes, UA: NEGATIVE
Nitrite, UA: NEGATIVE
PH UA: 6 (ref 5.0–8.0)
Protein Ur, POC: NEGATIVE mg/dL

## 2017-05-24 NOTE — Telephone Encounter (Signed)
Received requested immunization records from [facility]. Sending back for review.

## 2017-05-24 NOTE — Progress Notes (Signed)
Subjective:   HPI  Christine Fields is a 37 y.o. female who presents for Chief Complaint  Patient presents with  . Annual Exam   Medical care team includes: Tysinger, Kermit Balo, PA-C here for primary care Dentist Eye doctor Dr. Delfino Lovett, gynecology  Concerns: HTN - 120/76 recently at [facility], 130/80 the other day at the [facility].  Has gained a few pounds recently.  Diet is "trash."  Is exercising though, just started training for 5k.  Tetanus updated recently through employment screening with [employer], thinks it was 03/03/17.   Reviewed their medical, surgical, family, social, medication, and allergy history and updated chart as appropriate.  Past Medical History:  Diagnosis Date  . Hypertension     Past Surgical History:  Procedure Laterality Date  . CESAREAN SECTION      Social History   Socioeconomic History  . Marital status: Divorced    Spouse name: Not on file  . Number of children: Not on file  . Years of education: Not on file  . Highest education level: Not on file  Occupational History  . Not on file  Social Needs  . Financial resource strain: Not on file  . Food insecurity:    Worry: Not on file    Inability: Not on file  . Transportation needs:    Medical: Not on file    Non-medical: Not on file  Tobacco Use  . Smoking status: Never Smoker  . Smokeless tobacco: Never Used  Substance and Sexual Activity  . Alcohol use: Yes    Comment: occasionally  . Drug use: No  . Sexual activity: Not Currently  Lifestyle  . Physical activity:    Days per week: Not on file    Minutes per session: Not on file  . Stress: Not on file  Relationships  . Social connections:    Talks on phone: Not on file    Gets together: Not on file    Attends religious service: Not on file    Active member of club or organization: Not on file    Attends meetings of clubs or organizations: Not on file    Relationship status: Not on file  . Intimate partner violence:     Fear of current or ex partner: Not on file    Emotionally abused: Not on file    Physically abused: Not on file    Forced sexual activity: Not on file  Other Topics Concern  . 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  05/2017.    Family History  Problem Relation Age of Onset  . Diabetes Father   . Hypertension Father   . Stroke Father   . Hypertension Maternal Grandmother   . Diabetes Maternal Grandfather   . Cancer Maternal Grandfather      Current Outpatient Medications:  .  amLODipine (NORVASC) 5 MG tablet, Take 1 tablet (5 mg total) by mouth daily., Disp: 90 tablet, Rfl: 3 .  hydrochlorothiazide (HYDRODIURIL) 12.5 MG tablet, Take 1 tablet (12.5 mg total) by mouth daily., Disp: 90 tablet, Rfl: 3 .  levonorgestrel (MIRENA, 52 MG,) 20 MCG/24HR IUD, Mirena 20 mcg/24 hr (5 years) intrauterine device  Take 1 insert by intrauterine route as directed., Disp: , Rfl:  .  rizatriptan (MAXALT) 10 MG tablet, Take 1 tablet (10 mg total) by mouth as needed for migraine. May  repeat in 2 hours if needed, Disp: 10 tablet, Rfl: 2  Allergies  Allergen Reactions  . Kiwi Extract     Tongue and mouth burning/irritation     Review of Systems Constitutional: -fever, -chills, -sweats, -unexpected weight change, -decreased appetite, -fatigue Allergy: -sneezing, -itching, -congestion Dermatology: -changing moles, --rash, -lumps ENT: -runny nose, -ear pain, -sore throat, -hoarseness, -sinus pain, -teeth pain, - ringing in ears, -hearing loss, -nosebleeds Cardiology: -chest pain, -palpitations, -swelling, -difficulty breathing when lying flat, -waking up short of breath Respiratory: -cough, -shortness of breath, -difficulty breathing with exercise or exertion, -wheezing, -coughing up blood Gastroenterology: -abdominal pain, -nausea, -vomiting, -diarrhea, -constipation, -blood in stool,  -changes in bowel movement, -difficulty swallowing or eating Hematology: -bleeding, -bruising  Musculoskeletal: -joint aches, -muscle aches, -joint swelling, -back pain, -neck pain, -cramping, -changes in gait Ophthalmology: denies vision changes, eye redness, itching, discharge Urology: -burning with urination, -difficulty urinating, -blood in urine, -urinary frequency, -urgency, -incontinence Neurology: -headache, -weakness, -tingling, -numbness, -memory loss, -falls, -dizziness Psychology: -depressed mood, -agitation, -sleep problems Breast/gyn: -breast tenderness, -discharge, -lumps, -vaginal discharge,- irregular periods, -heavy periods     Objective:  BP (!) 136/98   Pulse 89   Temp 98 F (36.7 C) (Oral)   Ht  (1.702 m)   Wt 248 lb (112.5 kg)   SpO2 97%   BMI 38.84 kg/m   General appearance: alert, no distress, WD/WN, African American female Skin: no worrisome lesions HEENT: normocephalic, conjunctiva/corneas normal, sclerae anicteric, PERRLA, EOMi, nares patent, no discharge or erythema, pharynx normal Oral cavity: MMM, tongue normal, teeth normal Neck: supple, no lymphadenopathy, no thyromegaly, no masses, normal ROM, no bruits Chest: non tender, normal shape and expansion Heart: RRR, normal S1, S2, no murmurs Lungs: CTA bilaterally, no wheezes, rhonchi, or rales Abdomen: +bs, soft, non tender, non distended, no masses, no hepatomegaly, no splenomegaly, no bruits Back: non tender, normal ROM, no scoliosis Musculoskeletal: upper extremities non tender, no obvious deformity, normal ROM throughout, lower extremities non tender, no obvious deformity, normal ROM throughout Extremities: no edema, no cyanosis, no clubbing Pulses: 2+ symmetric, upper and lower extremities, normal cap refill Neurological: alert, oriented x 3, CN2-12 intact, strength normal upper extremities and lower extremities, sensation normal throughout, DTRs 2+ throughout, no cerebellar signs, gait  normal Psychiatric: normal affect, behavior normal, pleasant  Breast/gyn/rectal - deferred to gynecology     Assessment and Plan :   Encounter Diagnoses  Name Primary?  . Encounter for health maintenance examination in adult Yes  . Essential hypertension, benign   . Obesity with serious comorbidity, unspecified classification, unspecified obesity type   . IUD (intrauterine device) in place     Physical exam - discussed and counseled on healthy lifestyle, diet, exercise, preventative care, vaccinations, sick and well care, proper use of emergency dept and after hours care, and addressed their concerns.    Health screening: See your eye doctor yearly for routine vision care. See your dentist yearly for routine dental care including hygiene visits twice yearly.  Cancer screening Discussed and advised monthly self breast exams Discussed mammogram, advised mammogram age 51yo Discussed pap smear recommendations.   Pap smear per gyn.  Reviewed 2018 pap  Colonoscopy:  Age 1yo.  Vaccinations: Advised yearly influenza vaccine  Will get copy of other recent vaccines including Td through employee health 02/2017.  Acute issues discussed: none  Separate significant chronic issues discussed: Obesity and HTN - counseled on lifestyle changes, diet and exercise.  C/t current medication, BP monitoring .  Christine Fields  was seen today for annual exam.  Diagnoses and all orders for this visit:  Encounter for health maintenance examination in adult -     POCT Urinalysis DIP (Proadvantage Device) -     Comprehensive metabolic panel -     CBC with Differential/Platelet -     Lipid panel -     TSH -     Hemoglobin A1c -     VITAMIN D 25 Hydroxy (Vit-D Deficiency, Fractures)  Essential hypertension, benign  Obesity with serious comorbidity, unspecified classification, unspecified obesity type  IUD (intrauterine device) in place   Follow-up pending labs, yearly for physical

## 2017-05-24 NOTE — Patient Instructions (Signed)
Thanks for trusting [place] with your health care and for coming in for a physical today.  Below are some general recommendations I have for you:  Yearly screenings See your eye doctor yearly for routine vision care. See your dentist yearly for routine dental care including hygiene visits twice yearly. See me here yearly for a routine physical and preventative care visit   Cancer screening Colon cancer screening:   We start doing colonoscopy between age 39 and 57yo  Breast cancer screening -  Have a baseline mammogram at 40yo  Cervical Cancer screening - continue routine follow up with your gynecologist  Exercise at least 150 minutes per week, eat a healthy low fat diet such as Mediterranean diet and work on losing weight   Mediterranean Diet A Mediterranean diet refers to food and lifestyle choices that are based on the traditions of countries located on the [employer]. This way of eating has been shown to help prevent certain conditions and improve outcomes for people who have chronic diseases, like kidney disease and heart disease. What are tips for following this plan? Lifestyle  Cook and eat meals together with your family, when possible.  Drink enough fluid to keep your urine clear or pale yellow.  Be physically active every day. This includes: ? Aerobic exercise like running or swimming. ? Leisure activities like gardening, walking, or housework.  Get 7-8 hours of sleep each night.  If recommended by your health care provider, drink red wine in moderation. This means 1 glass a day for nonpregnant women and 2 glasses a day for men. A glass of wine equals 5 oz (150 mL). Reading food labels  Check the serving size of packaged foods. For foods such as rice and pasta, the serving size refers to the amount of cooked product, not dry.  Check the total fat in packaged foods. Avoid foods that have saturated fat or trans fats.  Check the ingredients list for added sugars,  such as corn syrup. Shopping  At the grocery store, buy most of your food from the areas near the walls of the store. This includes: ? Fresh fruits and vegetables (produce). ? Grains, beans, nuts, and seeds. Some of these may be available in unpackaged forms or large amounts (in bulk). ? Fresh seafood. ? Poultry and eggs. ? Low-fat dairy products.  Buy whole ingredients instead of prepackaged foods.  Buy fresh fruits and vegetables in-season from local farmers markets.  Buy frozen fruits and vegetables in resealable bags.  If you do not have access to quality fresh seafood, buy precooked frozen shrimp or canned fish, such as tuna, salmon, or sardines.  Buy small amounts of raw or cooked vegetables, salads, or olives from the deli or salad bar at your store.  Stock your pantry so you always have certain foods on hand, such as olive oil, canned tuna, canned tomatoes, rice, pasta, and beans. Cooking  Cook foods with extra-virgin olive oil instead of using butter or other vegetable oils.  Have meat as a side dish, and have vegetables or grains as your main dish. This means having meat in small portions or adding small amounts of meat to foods like pasta or stew.  Use beans or vegetables instead of meat in common dishes like chili or lasagna.  Experiment with different cooking methods. Try roasting or broiling vegetables instead of steaming or sauteing them.  Add frozen vegetables to soups, stews, pasta, or rice.  Add nuts or seeds for added healthy fat at  each meal. You can add these to yogurt, salads, or vegetable dishes.  Marinate fish or vegetables using olive oil, lemon juice, garlic, and fresh herbs. Meal planning  Plan to eat 1 vegetarian meal one day each week. Try to work up to 2 vegetarian meals, if possible.  Eat seafood 2 or more times a week.  Have healthy snacks readily available, such as: ? Vegetable sticks with hummus. ? [place] yogurt. ? Fruit and nut trail  mix.  Eat balanced meals throughout the week. This includes: ? Fruit: 2-3 servings a day ? Vegetables: 4-5 servings a day ? Low-fat dairy: 2 servings a day ? Fish, poultry, or lean meat: 1 serving a day ? Beans and legumes: 2 or more servings a week ? Nuts and seeds: 1-2 servings a day ? Whole grains: 6-8 servings a day ? Extra-virgin olive oil: 3-4 servings a day  Limit red meat and sweets to only a few servings a month What are my food choices?  Mediterranean diet ? Recommended ? Grains: Whole-grain pasta. Brown rice. Bulgar wheat. Polenta. Couscous. Whole-wheat bread. Orpah Cobb. ? Vegetables: Artichokes. Beets. Broccoli. Cabbage. Carrots. Eggplant. Green beans. Chard. Kale. Spinach. Onions. Leeks. Peas. Squash. Tomatoes. Peppers. Radishes. ? Fruits: Apples. Apricots. Avocado. Berries. Bananas. Cherries. Dates. Figs. Grapes. Lemons. Melon. Oranges. Peaches. Plums. Pomegranate. ? Meats and other protein foods: Beans. Almonds. Sunflower seeds. Pine nuts. Peanuts. Cod. Salmon. Scallops. Shrimp. Tuna. Tilapia. Clams. Oysters. Eggs. ? Dairy: Low-fat milk. Cheese. Greek yogurt. ? Beverages: Water. Red wine. Herbal tea. ? Fats and oils: Extra virgin olive oil. Avocado oil. Grape seed oil. ? Sweets and desserts: [place] yogurt with honey. Baked apples. Poached pears. Trail mix. ? Seasoning and other foods: Basil. Cilantro. Coriander. Cumin. Mint. Parsley. Sage. Rosemary. Tarragon. Garlic. Oregano. Thyme. Pepper. Balsalmic vinegar. Tahini. Hummus. Tomato sauce. Olives. Mushrooms. ? Limit these ? Grains: Prepackaged pasta or rice dishes. Prepackaged cereal with added sugar. ? Vegetables: Deep fried potatoes (french fries). ? Fruits: Fruit canned in syrup. ? Meats and other protein foods: Beef. Pork. Lamb. Poultry with skin. Hot dogs. Tomasa Blase. ? Dairy: Ice cream. Sour cream. Whole milk. ? Beverages: Juice. Sugar-sweetened soft drinks. Beer. Liquor and spirits. ? Fats and oils: Butter.  [place]ola oil. Vegetable oil. Beef fat (tallow). Lard. ? Sweets and desserts: Cookies. Cakes. Pies. Candy. ? Seasoning and other foods: Mayonnaise. Premade sauces and marinades. ? The items listed may not be a complete list. Talk with your dietitian about what dietary choices are right for you. Summary  The Mediterranean diet includes both food and lifestyle choices.  Eat a variety of fresh fruits and vegetables, beans, nuts, seeds, and whole grains.  Limit the amount of red meat and sweets that you eat.  Talk with your health care provider about whether it is safe for you to drink red wine in moderation. This means 1 glass a day for nonpregnant women and 2 glasses a day for men. A glass of wine equals 5 oz (150 mL). This information is not intended to replace advice given to you by your health care provider. Make sure you discuss any questions you have with your health care provider. Document Released: 08/22/2015 Document Revised: 09/24/2015 Document Reviewed: 08/22/2015 Elsevier Interactive Patient Education  [employer].   Please follow up yearly for a physical.    I have included other useful information below for your review.  Preventative Care for Adults - Female      MAINTAIN REGULAR HEALTH EXAMS:  A routine  yearly physical is a good way to check in with your primary care provider about your health and preventive screening. It is also an opportunity to share updates about your health and any concerns you have, and receive a thorough all-over exam.   Most health insurance companies pay for at least some preventative services.  Check with your health plan for specific coverages.  WHAT PREVENTATIVE SERVICES DO WOMEN NEED?  Adult women should have their weight and blood pressure checked regularly.   Women age 60 and older should have their cholesterol levels checked regularly.  Women should be screened for cervical cancer with a Pap smear and pelvic exam beginning at  either age 61, or 3 years after they become sexually activity.    Breast cancer screening generally begins at age 66 with a mammogram and breast exam by your primary care provider.    Beginning at age 71 and continuing to age 46, women should be screened for colorectal cancer.  Certain people may need continued testing until age 79.  Updating vaccinations is part of preventative care.  Vaccinations help protect against diseases such as the flu.  Osteoporosis is a disease in which the bones lose minerals and strength as we age. Women ages 62 and over should discuss this with their caregivers, as should women after menopause who have other risk factors.  Lab tests are generally done as part of preventative care to screen for anemia and blood disorders, to screen for problems with the kidneys and liver, to screen for bladder problems, to check blood sugar, and to check your cholesterol level.  Preventative services generally include counseling about diet, exercise, avoiding tobacco, drugs, excessive alcohol consumption, and sexually transmitted infections.    GENERAL RECOMMENDATIONS FOR GOOD HEALTH:  Healthy diet:  Eat a variety of foods, including fruit, vegetables, animal or vegetable protein, such as meat, fish, chicken, and eggs, or beans, lentils, tofu, and grains, such as rice.  Drink plenty of water daily.  Decrease saturated fat in the diet, avoid lots of red meat, processed foods, sweets, fast foods, and fried foods.  Exercise:  Aerobic exercise helps maintain good heart health. At least 30-40 minutes of moderate-intensity exercise is recommended. For example, a brisk walk that increases your heart rate and breathing. This should be done on most days of the week.   Find a type of exercise or a variety of exercises that you enjoy so that it becomes a part of your daily life.  Examples are running, walking, swimming, water aerobics, and biking.  For motivation and support, explore  group exercise such as aerobic class, spin class, Zumba, Yoga,or  martial arts, etc.    Set exercise goals for yourself, such as a certain weight goal, walk or run in a race such as a 5k walk/run.  Speak to your primary care provider about exercise goals.  Disease prevention:  If you smoke or chew tobacco, find out from your caregiver how to quit. It can literally save your life, no matter how long you have been a tobacco user. If you do not use tobacco, never begin.   Maintain a healthy diet and normal weight. Increased weight leads to problems with blood pressure and diabetes.   The Body Mass Index or BMI is a way of measuring how much of your body is fat. Having a BMI above 27 increases the risk of heart disease, diabetes, hypertension, stroke and other problems related to obesity. Your caregiver can help determine your BMI and  based on it develop an exercise and dietary program to help you achieve or maintain this important measurement at a healthful level.  High blood pressure causes heart and blood vessel problems.  Persistent high blood pressure should be treated with medicine if weight loss and exercise do not work.   Fat and cholesterol leaves deposits in your arteries that can block them. This causes heart disease and vessel disease elsewhere in your body.  If your cholesterol is found to be high, or if you have heart disease or certain other medical conditions, then you may need to have your cholesterol monitored frequently and be treated with medication.   Ask if you should have a cardiac stress test if your history suggests this. A stress test is a test done on a treadmill that looks for heart disease. This test can find disease prior to there being a problem.  Menopause can be associated with physical symptoms and risks. Hormone replacement therapy is available to decrease these. You should talk to your caregiver about whether starting or continuing to take hormones is right for you.     Osteoporosis is a disease in which the bones lose minerals and strength as we age. This can result in serious bone fractures. Risk of osteoporosis can be identified using a bone density scan. Women ages 24 and over should discuss this with their caregivers, as should women after menopause who have other risk factors. Ask your caregiver whether you should be taking a calcium supplement and Vitamin D, to reduce the rate of osteoporosis.   Avoid drinking alcohol in excess (more than two drinks per day).  Avoid use of street drugs. Do not share needles with anyone. Ask for professional help if you need assistance or instructions on stopping the use of alcohol, cigarettes, and/or drugs.  Brush your teeth twice a day with fluoride toothpaste, and floss once a day. Good oral hygiene prevents tooth decay and gum disease. The problems can be painful, unattractive, and can cause other health problems. Visit your dentist for a routine oral and dental check up and preventive care every 6-12 months.   Look at your skin regularly.  Use a mirror to look at your back. Notify your caregivers of changes in moles, especially if there are changes in shapes, colors, a size larger than a pencil eraser, an irregular border, or development of new moles.  Safety:  Use seatbelts 100% of the time, whether driving or as a passenger.  Use safety devices such as hearing protection if you work in environments with loud noise or significant background noise.  Use safety glasses when doing any work that could send debris in to the eyes.  Use a helmet if you ride a bike or motorcycle.  Use appropriate safety gear for contact sports.  Talk to your caregiver about gun safety.  Use sunscreen with a SPF (or skin protection factor) of 15 or greater.  Lighter skinned people are at a greater risk of skin cancer. Don't forget to also wear sunglasses in order to protect your eyes from too much damaging sunlight. Damaging sunlight can  accelerate cataract formation.   Practice safe sex. Use condoms. Condoms are used for birth control and to help reduce the spread of sexually transmitted infections (or STIs).  Some of the STIs are gonorrhea (the clap), chlamydia, syphilis, trichomonas, herpes, HPV (human papilloma virus) and HIV (human immunodeficiency virus) which causes AIDS. The herpes, HIV and HPV are viral illnesses that have no cure. These  can result in disability, cancer and death.   Keep carbon monoxide and smoke detectors in your home functioning at all times. Change the batteries every 6 months or use a model that plugs into the wall.   Vaccinations:  Stay up to date with your tetanus shots and other required immunizations. You should have a booster for tetanus every 10 years. Be sure to get your flu shot every year, since 5%-20% of the [place] population comes down with the flu. The flu vaccine changes each year, so being vaccinated once is not enough. Get your shot in the fall, before the flu season peaks.   Other vaccines to consider:  Human Papilloma Virus or HPV causes cancer of the cervix, and other infections that can be transmitted from person to person. There is a vaccine for HPV, and females should get immunized between the ages of 60 and 82. It requires a series of 3 shots.   Pneumococcal vaccine to protect against certain types of pneumonia.  This is normally recommended for adults age 84 or older.  However, adults younger than 39 years old with certain underlying conditions such as diabetes, heart or lung disease should also receive the vaccine.  Shingles vaccine to protect against Varicella Zoster if you are older than age 2, or younger than 39 years old with certain underlying illness.  If you have not had the Shingrix vaccine, please call your insurer to inquire about coverage for the Shingrix vaccine given in 2 doses.   Some insurers cover this vaccine after age 73, some cover this after age 43.  If your  insurer covers this, then call to schedule appointment to have this vaccine here  Hepatitis A vaccine to protect against a form of infection of the liver by a virus acquired from food.  Hepatitis B vaccine to protect against a form of infection of the liver by a virus acquired from blood or body fluids, particularly if you work in health care.  If you plan to travel internationally, check with your local health department for specific vaccination recommendations.  Cancer Screening:  Breast cancer screening is essential to preventive care for women. All women age 53 and older should perform a breast self-exam every month. At age 55 and older, women should have their caregiver complete a breast exam each year. Women at ages 62 and older should have a mammogram (x-ray film) of the breasts. Your caregiver can discuss how often you need mammograms.    Cervical cancer screening includes taking a Pap smear (sample of cells examined under a microscope) from the cervix (end of the uterus). It also includes testing for HPV (Human Papilloma Virus, which can cause cervical cancer). Screening and a pelvic exam should begin at age 63, or 3 years after a woman becomes sexually active. Screening should occur every year, with a Pap smear but no HPV testing, up to age 56. After age 78, you should have a Pap smear every 3 years with HPV testing, if no HPV was found previously.   Most routine colon cancer screening begins at the age of 31. On a yearly basis, doctors may provide special easy to use take-home tests to check for hidden blood in the stool. Sigmoidoscopy or colonoscopy can detect the earliest forms of colon cancer and is life saving. These tests use a small camera at the end of a tube to directly examine the colon. Speak to your caregiver about this at age 92, when routine screening begins (and is  repeated every 5 years unless early forms of pre-cancerous polyps or small growths are found).

## 2017-05-25 ENCOUNTER — Other Ambulatory Visit: Payer: Self-pay | Admitting: Medical

## 2017-05-25 LAB — CBC WITH DIFFERENTIAL/PLATELET
Basophils Absolute: 0 10*3/uL (ref 0.0–0.2)
Basos: 0 %
EOS (ABSOLUTE): 0.1 10*3/uL (ref 0.0–0.4)
Eos: 1 %
Hematocrit: 40.2 % (ref 34.0–46.6)
Hemoglobin: 13.4 g/dL (ref 11.1–15.9)
IMMATURE GRANULOCYTES: 0 %
Immature Grans (Abs): 0 10*3/uL (ref 0.0–0.1)
Lymphocytes Absolute: 2.4 10*3/uL (ref 0.7–3.1)
Lymphs: 32 %
MCH: 28.7 pg (ref 26.6–33.0)
MCHC: 33.3 g/dL (ref 31.5–35.7)
MCV: 86 fL (ref 79–97)
MONOS ABS: 0.4 10*3/uL (ref 0.1–0.9)
Monocytes: 6 %
NEUTROS PCT: 61 %
Neutrophils Absolute: 4.6 10*3/uL (ref 1.4–7.0)
PLATELETS: 218 10*3/uL (ref 150–379)
RBC: 4.67 x10E6/uL (ref 3.77–5.28)
RDW: 13.8 % (ref 12.3–15.4)
WBC: 7.6 10*3/uL (ref 3.4–10.8)

## 2017-05-25 LAB — COMPREHENSIVE METABOLIC PANEL
ALBUMIN: 4.5 g/dL (ref 3.5–5.5)
ALK PHOS: 69 IU/L (ref 39–117)
ALT: 11 IU/L (ref 0–32)
AST: 13 IU/L (ref 0–40)
Albumin/Globulin Ratio: 1.7 (ref 1.2–2.2)
BUN/Creatinine Ratio: 18 (ref 9–23)
BUN: 12 mg/dL (ref 6–20)
Bilirubin Total: 0.7 mg/dL (ref 0.0–1.2)
CALCIUM: 9.2 mg/dL (ref 8.7–10.2)
CHLORIDE: 104 mmol/L (ref 96–106)
CO2: 21 mmol/L (ref 20–29)
CREATININE: 0.67 mg/dL (ref 0.57–1.00)
GFR calc Af Amer: 128 mL/min/{1.73_m2} (ref >59)
GFR calc non Af Amer: 111 mL/min/{1.73_m2} (ref >59)
GLOBULIN, TOTAL: 2.7 g/dL (ref 1.5–4.5)
Glucose: 103 mg/dL — ABNORMAL HIGH (ref 65–99)
POTASSIUM: 4.2 mmol/L (ref 3.5–5.2)
SODIUM: 139 mmol/L (ref 134–144)
Total Protein: 7.2 g/dL (ref 6.0–8.5)

## 2017-05-25 LAB — LIPID PANEL
Chol/HDL Ratio: 4.1 ratio (ref 0.0–4.4)
Cholesterol, Total: 164 mg/dL (ref 100–199)
HDL: 40 mg/dL (ref >39)
LDL Calculated: 116 mg/dL — ABNORMAL HIGH (ref 0–99)
TRIGLYCERIDES: 41 mg/dL (ref 0–149)
VLDL Cholesterol Cal: 8 mg/dL (ref 5–40)

## 2017-05-25 LAB — HEMOGLOBIN A1C
Est. average glucose Bld gHb Est-mCnc: 117 mg/dL
HEMOGLOBIN A1C: 5.7 % — AB (ref 4.8–5.6)

## 2017-05-25 LAB — VITAMIN D 25 HYDROXY (VIT D DEFICIENCY, FRACTURES): Vit D, 25-Hydroxy: 15.1 ng/mL — ABNORMAL LOW (ref 30.0–100.0)

## 2017-05-25 LAB — TSH: TSH: 2.24 u[IU]/mL (ref 0.450–4.500)

## 2017-05-25 MED ORDER — VITAMIN D 1000 UNITS PO TABS: 1000.0000 [IU] | ORAL_TABLET | Freq: Every day | ORAL | 3 refills | Status: DC

## 2017-09-16 MED FILL — RIZATRIPTAN BENZOATE 10 MG: 10 | 30 days supply | Qty: 10 | Fill #1

## 2017-10-01 DIAGNOSIS — H52223 Regular astigmatism, bilateral: Secondary | ICD-10-CM | POA: Diagnosis not present

## 2017-10-01 DIAGNOSIS — H5213 Myopia, bilateral: Secondary | ICD-10-CM | POA: Diagnosis not present

## 2017-10-12 ENCOUNTER — Ambulatory Visit (INDEPENDENT_AMBULATORY_CARE_PROVIDER_SITE_OTHER): Payer: 59 | Admitting: Medical

## 2017-10-12 ENCOUNTER — Encounter: Payer: Self-pay | Admitting: Medical

## 2017-10-12 VITALS — BP 150/90 | HR 79 | Temp 98.3°F | Resp 16 | Ht 66.0 in | Wt 249.8 lb

## 2017-10-12 DIAGNOSIS — R002 Palpitations: Secondary | ICD-10-CM | POA: Diagnosis not present

## 2017-10-12 DIAGNOSIS — F419 Anxiety disorder, unspecified: Secondary | ICD-10-CM | POA: Diagnosis not present

## 2017-10-12 DIAGNOSIS — I10 Essential (primary) hypertension: Secondary | ICD-10-CM

## 2017-10-12 DIAGNOSIS — F410 Panic disorder [episodic paroxysmal anxiety] without agoraphobia: Secondary | ICD-10-CM | POA: Insufficient documentation

## 2017-10-12 DIAGNOSIS — F418 Other specified anxiety disorders: Secondary | ICD-10-CM | POA: Diagnosis not present

## 2017-10-12 MED ORDER — RIZATRIPTAN BENZOATE 10 MG PO TABS
10.0000 mg | ORAL_TABLET | ORAL | 2 refills | Status: DC | PRN
Start: 2017-10-12 — End: 2018-07-06

## 2017-10-12 MED ORDER — METOPROLOL TARTRATE 25 MG PO TABS: 25.0000 mg | ORAL_TABLET | Freq: Two times a day (BID) | ORAL | 2 refills | Status: DC

## 2017-10-12 MED FILL — RIZATRIPTAN BENZOATE 10 MG: 10 | 30 days supply | Qty: 10 | Fill #0

## 2017-10-12 MED FILL — METOPROLOL TARTRATE 25 MG T: 25 | 30 days supply | Qty: 60 | Fill #0

## 2017-10-12 NOTE — Progress Notes (Signed)
Subjective: Chief Complaint  Patient presents with  . panic attacks    rapid heart beat, shakey, hearing cats outside, crying X Saturday  stress and worring   Here for panic attacks.    When she gets these episodes, she will feel heart pounding, palpitations, can hear heart beating, crying spells, hands get shaky, legs are tapping.  This past Saturday driving back from [place] to [place], pulled over at rest stop with these symptoms, was breathing fast, had to work to calm down her breathing.   This seemed to last 5 hours Saturday with her symptoms.  At one point she felt she heard cats outside crying.  Denies hearing voices.   Denies hallucinations.   Feels somewhat down and depressed at times.     She notes feeling these symptoms since 07/05/17.  She notes having a board exam that day and it was 2 days before a friends birthday.   Since then has had periodic times where she has felt these symptoms.  This past Saturday was bad.    Feels like she is under a lot of stress.     She knows her BP has been elevated as well.    Lives with daughter, Alma Friendly.   Working full time.   Stressors include board exam she can't seem to pass, relationship with her mother.    Has to take board exam again in December.    Saw her counselor yesterday, has seen her periodically.  Was advised to come in and see her PCP.  Has tried mindfulness breathing, has app on phone for this, has worked to decrease stress.  Denies chest pain, denies swelling in her legs, no syncope, no paresthesias.  Past Medical History:  Diagnosis Date  . Hypertension    Current Outpatient Medications on File Prior to Visit  Medication Sig Dispense Refill  . amLODipine (NORVASC) 5 MG tablet Take 1 tablet (5 mg total) by mouth daily. 90 tablet 3  . hydrochlorothiazide (HYDRODIURIL) 12.5 MG tablet Take 1 tablet (12.5 mg total) by mouth daily. 90 tablet 3  . levonorgestrel (MIRENA, 52 MG,) 20 MCG/24HR IUD     . cholecalciferol (VITAMIN  D) 1000 units tablet Take 1 tablet (1,000 Units total) by mouth daily. (Patient not taking: Reported on 10/12/2017) 90 tablet 3   No current facility-administered medications on file prior to visit.    ROS as in subjective    Objective: BP (!) 150/90   Pulse 79   Temp 98.3 F (36.8 C) (Oral)   Resp 16   Ht 5\' 6"  (1.676 m)   Wt 249 lb 12.8 oz (113.3 kg)   SpO2 97%   BMI 40.32 kg/m   Wt Readings from Last 3 Encounters:  10/12/17 249 lb 12.8 oz (113.3 kg)  05/24/17 248 lb (112.5 kg)  04/12/17 242 lb 12.8 oz (110.1 kg)   BP Readings from Last 3 Encounters:  10/12/17 (!) 150/90  05/24/17 (!) 136/98  04/12/17 138/88     General appearance: alert, no distress, WD/WN, AA female Neck: supple, no lymphadenopathy, no thyromegaly, no masses Heart: RRR, normal S1, S2, no murmurs Lungs: CTA bilaterally, no wheezes, rhonchi, or rales Extremities: no edema, no cyanosis, no clubbing Pulses: 2+ symmetric, upper and lower extremities, normal cap refill Neurological: alert, oriented x 3, CN2-12 intact, strength normal upper extremities and lower extremities, sensation normal throughout, DTRs 2+ throughout, no cerebellar signs, gait normal Psychiatric: Somewhat negative affect today, answers questions appropriately but seems irritated  Assessment: Encounter Diagnoses  Name Primary?  . Panic attack Yes  . Anxiety   . Essential hypertension, benign   . Palpitation   . Test anxiety       Plan: I reviewed her labs from 05/2017 physical visit.   We discussed ways to deal with stress and anxiety.  I recommend regular exercise such as 30 minutes or more most days of the week such as walking running and bicycling  I recommend taking some time to meditate or pray daily to help slow racing thoughts.  I recommend working on relaxation techniques such as deep breathing exercises in a comfortable position relaxing your body.  There are free Apps on the smart phone for this for  example  Consider getting a massage  Journal or use diary to express your ideas on paper to cope with anxiety and stress  Work on time management, use a calendar or plan out things to avoid stressing about things. Find ways to utilize your time to include exercise and personal "me" time.  Some people use aromatherapy such as lavender to relax  Some people use herbal teas to help calm their mood  Spend some time with animals or your pet if you have one Continue seeing counselor to help deal with anxiety and work on specific techniques  Other Recommendations  Begin metoprolol 25 mg twice daily for both blood pressure and anxiety  This medication can help calm the palpitations and anxious feelings  Stop the hydrochlorothiazide temporarily  Continue amlodipine 5 mg daily  Monitor your blood pressure.  Goal should be 120/70  Work with your counselor on strategies to help with panic attack and test taking anxiety  Let us plan to see you back in 2 to 3 weeks    Wilson was seen today for panic attacks.  Diagnoses and all orders for this visit:  Panic attack  Anxiety  Essential hypertension, benign  Palpitation  Test anxiety  Other orders -     rizatriptan (MAXALT) 10 MG tablet; Take 1 tablet (10 mg total) by mouth as needed for migraine. May repeat in 2 hours if needed -     metoprolol tartrate (LOPRESSOR) 25 MG tablet; Take 1 tablet (25 mg total) by mouth 2 (two) times daily. 1 tablet po BID for blood pressure

## 2017-10-12 NOTE — Patient Instructions (Signed)
Recommendations  Begin metoprolol 25 mg twice daily for both blood pressure and anxiety  This medication can help calm the palpitations and anxious feelings  Stop the hydrochlorothiazide temporarily  Continue amlodipine 5 mg daily  Monitor your blood pressure.  Goal should be 120/70  Work with your counselor on strategies to help with panic attack and test taking anxiety  Let us plan to see you back in 2 to 3 weeks    Panic Attacks Panic attacks are sudden, short feelings of great fear or discomfort. You may have them for no reason when you are relaxed, when you are uneasy (anxious), or when you are sleeping. Follow these instructions at home:  Take all your medicines as told.  Check with your doctor before starting new medicines.  Keep all doctor visits. Contact a doctor if:  You are not able to take your medicines as told.  Your symptoms do not get better.  Your symptoms get worse. Get help right away if:  Your attacks seem different than your normal attacks.  You have thoughts about hurting yourself or others.  You take panic attack medicine and you have a side effect. This information is not intended to replace advice given to you by your health care provider. Make sure you discuss any questions you have with your health care provider. Document Released: 01/31/2010 Document Revised: 06/06/2015 Document Reviewed: 08/12/2012 Elsevier Interactive Patient Education  2017 [employer].

## 2017-11-12 ENCOUNTER — Ambulatory Visit: Payer: 59 | Admitting: Medical

## 2017-11-17 MED FILL — HYDROCHLOROTHIAZIDE 12.5 MG: 12.5 | 90 days supply | Qty: 90 | Fill #1

## 2017-11-17 MED FILL — AMLODIPINE BESYLATE 5 MG TA: 5 | 90 days supply | Qty: 90 | Fill #1

## 2017-12-03 ENCOUNTER — Institutional Professional Consult (permissible substitution): Payer: BLUE CROSS/BLUE SHIELD | Admitting: Physician Assistant

## 2017-12-31 MED FILL — RIZATRIPTAN BENZOATE 10 MG: 10 | 30 days supply | Qty: 10 | Fill #1

## 2018-01-28 ENCOUNTER — Institutional Professional Consult (permissible substitution): Payer: Managed Care, Other (non HMO) | Admitting: Physician Assistant

## 2018-03-01 MED FILL — HYDROCHLOROTHIAZIDE 12.5 MG: 12.5 | 90 days supply | Qty: 90 | Fill #2

## 2018-03-01 MED FILL — AMLODIPINE BESYLATE 5 MG TA: 5 | 90 days supply | Qty: 90 | Fill #2

## 2018-04-04 MED FILL — RIZATRIPTAN BENZOATE 10 MG: 10 | 30 days supply | Qty: 10 | Fill #0

## 2018-05-26 ENCOUNTER — Encounter: Payer: 59 | Admitting: Medical

## 2018-07-06 ENCOUNTER — Encounter: Payer: Self-pay | Admitting: Medical

## 2018-07-06 ENCOUNTER — Ambulatory Visit (INDEPENDENT_AMBULATORY_CARE_PROVIDER_SITE_OTHER): Payer: 59 | Admitting: Medical

## 2018-07-06 ENCOUNTER — Other Ambulatory Visit: Payer: Self-pay

## 2018-07-06 VITALS — BP 140/96 | HR 74 | Temp 98.3°F | Resp 16 | Ht 66.5 in | Wt 256.8 lb

## 2018-07-06 DIAGNOSIS — Z975 Presence of (intrauterine) contraceptive device: Secondary | ICD-10-CM | POA: Diagnosis not present

## 2018-07-06 DIAGNOSIS — G43909 Migraine, unspecified, not intractable, without status migrainosus: Secondary | ICD-10-CM

## 2018-07-06 DIAGNOSIS — Z0000 Encounter for general adult medical examination without abnormal findings: Secondary | ICD-10-CM

## 2018-07-06 DIAGNOSIS — R7301 Impaired fasting glucose: Secondary | ICD-10-CM | POA: Insufficient documentation

## 2018-07-06 DIAGNOSIS — I10 Essential (primary) hypertension: Secondary | ICD-10-CM

## 2018-07-06 DIAGNOSIS — E559 Vitamin D deficiency, unspecified: Secondary | ICD-10-CM | POA: Insufficient documentation

## 2018-07-06 DIAGNOSIS — E669 Obesity, unspecified: Secondary | ICD-10-CM

## 2018-07-06 LAB — CBC

## 2018-07-06 LAB — LIPID PANEL

## 2018-07-06 MED ORDER — METOPROLOL TARTRATE 25 MG PO TABS: 25.0000 mg | ORAL_TABLET | Freq: Two times a day (BID) | ORAL | 3 refills | Status: DC

## 2018-07-06 MED ORDER — RIZATRIPTAN BENZOATE 10 MG PO TABS: 10.0000 mg | ORAL_TABLET | ORAL | 2 refills | Status: DC | PRN

## 2018-07-06 MED ORDER — HYDROCHLOROTHIAZIDE 12.5 MG PO TABS: 12.5000 mg | ORAL_TABLET | Freq: Every day | ORAL | 3 refills | Status: DC

## 2018-07-06 MED FILL — RIZATRIPTAN BENZOATE 10 MG: 10 | 30 days supply | Qty: 10 | Fill #0

## 2018-07-06 NOTE — Progress Notes (Signed)
Subjective:   HPI  MARJORIA MANCILLAS is a 38 y.o. female who presents for Chief Complaint  Patient presents with  . CPE    fasting CPE eye exam in July, has gyn  amodipine gives headache   Medical care team includes: Kristee Angus, Camelia Eng, PA-C here for primary care Dentist, Dr. Alain Marion, [facility] doctor Dr. Nash Shearer, gynecology, Sadie Haber  Concerns: No major concerns, doing well, knows diet to be better, exercising.  Still working for [employer] as a Education officer, museum.   Reviewed their medical, surgical, family, social, medication, and allergy history and updated chart as appropriate.  Past Medical History:  Diagnosis Date  . Hypertension   . Migraine     Past Surgical History:  Procedure Laterality Date  . CESAREAN SECTION      Social History   Socioeconomic History  . Marital status: Divorced    Spouse name: Not on file  . Number of children: Not on file  . Years of education: Not on file  . Highest education level: Not on file  Occupational History  . Not on file  Social Needs  . Financial resource strain: Not on file  . Food insecurity    Worry: Not on file    Inability: Not on file  . Transportation needs    Medical: Not on file    Non-medical: Not on file  Tobacco Use  . Smoking status: Never Smoker  . Smokeless tobacco: Never Used  Substance and Sexual Activity  . Alcohol use: Yes    Alcohol/week: 1.0 standard drinks    Types: 1 Glasses of wine per week    Comment: occasionally  . Drug use: No  . Sexual activity: Not Currently  Lifestyle  . Physical activity    Days per week: Not on file    Minutes per session: Not on file  . Stress: Not on file  Relationships  . Social Herbalist on phone: Not on file    Gets together: Not on file    Attends religious service: Not on file    Active member of club or organization: Not on file    Attends meetings of clubs or organizations: Not on file    Relationship status: Not on file   . Intimate partner violence    Fear of current or ex partner: Not on file    Emotionally abused: Not on file    Physically abused: Not on file    Forced sexual activity: Not on file  Other Topics Concern  . Not on file  Social History Narrative   Lives with daughter, works as Education officer, museum for [employer], exercise - running 2-3 days per week, training for [employer].  06/2018    Family History  Problem Relation Age of Onset  . Diabetes Father   . Hypertension Father   . Stroke Father   . Hypertension Maternal Grandmother   . Diabetes Maternal Grandfather   . Cancer Maternal Grandfather      Current Outpatient Medications:  .  hydrochlorothiazide (HYDRODIURIL) 12.5 MG tablet, Take 1 tablet (12.5 mg total) by mouth daily., Disp: 90 tablet, Rfl: 3 .  levonorgestrel (MIRENA, 52 MG,) 20 MCG/24HR IUD, , Disp: , Rfl:  .  rizatriptan (MAXALT) 10 MG tablet, Take 1 tablet (10 mg total) by mouth as needed for migraine. May repeat in 2 hours if needed, Disp: 10 tablet, Rfl: 2 .  amLODipine (NORVASC) 5 MG tablet, Take 1 tablet (5 mg  total) by mouth daily. (Patient not taking: Reported on 07/06/2018), Disp: 90 tablet, Rfl: 3 .  cholecalciferol (VITAMIN D) 1000 units tablet, Take 1 tablet (1,000 Units total) by mouth daily. (Patient not taking: Reported on 10/12/2017), Disp: 90 tablet, Rfl: 3 .  metoprolol tartrate (LOPRESSOR) 25 MG tablet, Take 1 tablet (25 mg total) by mouth 2 (two) times daily. 1 tablet po BID for blood pressure, Disp: 180 tablet, Rfl: 3  Allergies  Allergen Reactions  . Amlodipine     Headache   . Kiwi Extract     Tongue and mouth burning/irritation     Review of Systems Constitutional: -fever, -chills, -sweats, -unexpected weight change, -decreased appetite, -fatigue Allergy: -sneezing, -itching, -congestion Dermatology: -changing moles, --rash, -lumps ENT: -runny nose, -ear pain, -sore throat, -hoarseness, -sinus pain, -teeth pain, - ringing in ears, -hearing loss,  -nosebleeds Cardiology: -chest pain, -palpitations, -swelling, -difficulty breathing when lying flat, -waking up short of breath Respiratory: -cough, -shortness of breath, -difficulty breathing with exercise or exertion, -wheezing, -coughing up blood Gastroenterology: -abdominal pain, -nausea, -vomiting, -diarrhea, -constipation, -blood in stool, -changes in bowel movement, -difficulty swallowing or eating Hematology: -bleeding, -bruising  Musculoskeletal: -joint aches, -muscle aches, -joint swelling, -back pain, -neck pain, -cramping, -changes in gait Ophthalmology: denies vision changes, eye redness, itching, discharge Urology: -burning with urination, -difficulty urinating, -blood in urine, -urinary frequency, -urgency, -incontinence Neurology: -headache, -weakness, -tingling, -numbness, -memory loss, -falls, -dizziness Psychology: -depressed mood, -agitation, -sleep problems Breast/gyn: -breast tenderness, -discharge, -lumps, -vaginal discharge,- irregular periods, -heavy periods     Objective:  BP (!) 140/96   Pulse 74   Temp 98.3 F (36.8 C) (Oral)   Resp 16   Ht 5' 6.5" (1.689 m)   Wt 256 lb 12.8 oz (116.5 kg)   SpO2 98%   BMI 40.83 kg/m   General appearance: alert, no distress, WD/WN, African American female Skin: no worrisome lesions HEENT: normocephalic, conjunctiva/corneas normal, sclerae anicteric, PERRLA, EOMi, nares patent, no discharge or erythema, pharynx normal Oral cavity: MMM, tongue normal, teeth normal Neck: supple, no lymphadenopathy, no thyromegaly, no masses, normal ROM, no bruits Chest: non tender, normal shape and expansion Heart: RRR, normal S1, S2, no murmurs Lungs: CTA bilaterally, no wheezes, rhonchi, or rales Abdomen: +bs, soft, non tender, non distended, no masses, no hepatomegaly, no splenomegaly, no bruits Back: non tender, normal ROM, no scoliosis Musculoskeletal: upper extremities non tender, no obvious deformity, normal ROM throughout, lower  extremities non tender, no obvious deformity, normal ROM throughout Extremities: no edema, no cyanosis, no clubbing Pulses: 2+ symmetric, upper and lower extremities, normal cap refill Neurological: alert, oriented x 3, CN2-12 intact, strength normal upper extremities and lower extremities, sensation normal throughout, DTRs 2+ throughout, no cerebellar signs, gait normal Psychiatric: normal affect, behavior normal, pleasant  Breast/gyn/rectal - deferred to gynecology     Assessment and Plan :   Encounter Diagnoses  Name Primary?  . Encounter for health maintenance examination in adult Yes  . Migraine without status migrainosus, not intractable, unspecified migraine type   . Essential hypertension, benign   . Obesity with serious comorbidity, unspecified classification, unspecified obesity type   . IUD (intrauterine device) in place   . Impaired fasting blood sugar   . Vitamin D deficiency     Physical exam - discussed and counseled on healthy lifestyle, diet, exercise, preventative care, vaccinations, sick and well care, proper use of emergency dept and after hours care, and addressed their concerns.    Health screening: See your eye doctor  yearly for routine vision care. See your dentist yearly for routine dental care including hygiene visits twice yearly.  Cancer screening Discussed and advised monthly self breast exams Discussed mammogram, advised mammogram age 89yo Discussed pap smear recommendations.   Pap smear per gyn.   Colonoscopy:  Age 38yo.  Vaccinations: Advised yearly influenza vaccine  Acute issues discussed: none  Separate significant chronic issues discussed: Obesity, HTN, impaired glucose -I put a little pressure on her today to increase exercise frequency as 60 to 70% of maximal heart rate for fat burning, to work on more of a low-carb diet such as [place] diet, to track her progress, to use Fitbit to track steps.  Labs today.  Last year her labs show  impaired fasting glucose.  She did not tolerate amlodipine given headaches.  We will continue HCTZ., continue Metoprolol as she was on prior.  She apparently stopped Metoprolol at some point on her own.  Montior BPs.   F/u in 6 weeks.   Vit D deficiency - lab today, likely will need to restart supplement, counseled on sun exposure, fish intake  Migraines - lately not an issue. C/t Maxalt prn    Sue Lushndrea was seen today for cpe.  Diagnoses and all orders for this visit:  Encounter for health maintenance examination in adult -     VITAMIN D 25 Hydroxy (Vit-D Deficiency, Fractures) -     Hemoglobin A1c -     Comprehensive metabolic panel -     CBC -     Lipid panel  Migraine without status migrainosus, not intractable, unspecified migraine type  Essential hypertension, benign  Obesity with serious comorbidity, unspecified classification, unspecified obesity type  IUD (intrauterine device) in place  Impaired fasting blood sugar -     Hemoglobin A1c  Vitamin D deficiency -     VITAMIN D 25 Hydroxy (Vit-D Deficiency, Fractures)  Other orders -     metoprolol tartrate (LOPRESSOR) 25 MG tablet; Take 1 tablet (25 mg total) by mouth 2 (two) times daily. 1 tablet po BID for blood pressure -     rizatriptan (MAXALT) 10 MG tablet; Take 1 tablet (10 mg total) by mouth as needed for migraine. May repeat in 2 hours if needed -     hydrochlorothiazide (HYDRODIURIL) 12.5 MG tablet; Take 1 tablet (12.5 mg total) by mouth daily.   Follow-up pending labs, 6wk.

## 2018-07-07 ENCOUNTER — Other Ambulatory Visit: Payer: Self-pay | Admitting: Medical

## 2018-07-07 LAB — COMPREHENSIVE METABOLIC PANEL
ALT: 9 IU/L (ref 0–32)
AST: 10 IU/L (ref 0–40)
Albumin/Globulin Ratio: 1.8 (ref 1.2–2.2)
Albumin: 4.6 g/dL (ref 3.8–4.8)
Alkaline Phosphatase: 71 IU/L (ref 39–117)
BUN/Creatinine Ratio: 14 (ref 9–23)
BUN: 12 mg/dL (ref 6–24)
Bilirubin Total: 1 mg/dL (ref 0.0–1.2)
CO2: 23 mmol/L (ref 20–29)
Calcium: 9.8 mg/dL (ref 8.7–10.2)
Chloride: 102 mmol/L (ref 96–106)
Creatinine, Ser: 0.85 mg/dL (ref 0.57–1.00)
GFR calc Af Amer: 99 mL/min/{1.73_m2} (ref >59)
GFR calc non Af Amer: 86 mL/min/{1.73_m2} (ref >59)
Globulin, Total: 2.5 g/dL (ref 1.5–4.5)
Glucose: 107 mg/dL — ABNORMAL HIGH (ref 65–99)
Potassium: 3.8 mmol/L (ref 3.5–5.2)
Sodium: 140 mmol/L (ref 134–144)
Total Protein: 7.1 g/dL (ref 6.0–8.5)

## 2018-07-07 LAB — LIPID PANEL
Chol/HDL Ratio: 4.4 ratio (ref 0.0–4.4)
Cholesterol, Total: 167 mg/dL (ref 100–199)
HDL: 38 mg/dL — ABNORMAL LOW (ref >39)
LDL Calculated: 117 mg/dL — ABNORMAL HIGH (ref 0–99)
Triglycerides: 58 mg/dL (ref 0–149)
VLDL Cholesterol Cal: 12 mg/dL (ref 5–40)

## 2018-07-07 LAB — CBC
Hematocrit: 40.4 % (ref 34.0–46.6)
Hemoglobin: 13.3 g/dL (ref 11.1–15.9)
MCH: 27.7 pg (ref 26.6–33.0)
MCHC: 32.9 g/dL (ref 31.5–35.7)
MCV: 84 fL (ref 79–97)
Platelets: 227 10*3/uL (ref 150–450)
RBC: 4.81 x10E6/uL (ref 3.77–5.28)
RDW: 13.6 % (ref 11.7–15.4)
WBC: 7.9 10*3/uL (ref 3.4–10.8)

## 2018-07-07 LAB — HEMOGLOBIN A1C
Est. average glucose Bld gHb Est-mCnc: 126 mg/dL
Hgb A1c MFr Bld: 6 % — ABNORMAL HIGH (ref 4.8–5.6)

## 2018-07-07 LAB — VITAMIN D 25 HYDROXY (VIT D DEFICIENCY, FRACTURES): Vit D, 25-Hydroxy: 20.6 ng/mL — ABNORMAL LOW (ref 30.0–100.0)

## 2018-07-07 MED ORDER — VITAMIN D 25 MCG (1000 UNIT) PO TABS: 1000.0000 [IU] | ORAL_TABLET | Freq: Every day | ORAL | 3 refills | Status: DC

## 2018-10-29 MED FILL — FLUARIX QUADRIVALENT 0.5 ML: 0.5 | 1 days supply | Qty: 1 | Fill #0

## 2018-11-24 MED FILL — RIZATRIPTAN BENZOATE 10 MG: 10 | 30 days supply | Qty: 10 | Fill #1

## 2019-01-12 MED FILL — HYDROCHLOROTHIAZIDE 12.5 MG: 12.5 | 90 days supply | Qty: 90 | Fill #0

## 2019-01-12 MED FILL — METOPROLOL TARTRATE 25 MG T: 25 | 90 days supply | Qty: 180 | Fill #0

## 2019-01-13 DIAGNOSIS — R748 Abnormal levels of other serum enzymes: Secondary | ICD-10-CM

## 2019-01-13 HISTORY — DX: Abnormal levels of other serum enzymes: R74.8

## 2019-01-31 MED FILL — RIZATRIPTAN BENZOATE 10 MG: 10 | 30 days supply | Qty: 10 | Fill #2

## 2019-02-03 DIAGNOSIS — H52223 Regular astigmatism, bilateral: Secondary | ICD-10-CM | POA: Diagnosis not present

## 2019-02-03 DIAGNOSIS — H40013 Open angle with borderline findings, low risk, bilateral: Secondary | ICD-10-CM | POA: Diagnosis not present

## 2019-02-03 DIAGNOSIS — H1045 Other chronic allergic conjunctivitis: Secondary | ICD-10-CM | POA: Diagnosis not present

## 2019-02-03 DIAGNOSIS — H0288A Meibomian gland dysfunction right eye, upper and lower eyelids: Secondary | ICD-10-CM | POA: Diagnosis not present

## 2019-02-03 DIAGNOSIS — H5213 Myopia, bilateral: Secondary | ICD-10-CM | POA: Diagnosis not present

## 2019-02-03 DIAGNOSIS — H0288B Meibomian gland dysfunction left eye, upper and lower eyelids: Secondary | ICD-10-CM | POA: Diagnosis not present

## 2019-02-18 DIAGNOSIS — Z20828 Contact with and (suspected) exposure to other viral communicable diseases: Secondary | ICD-10-CM | POA: Diagnosis not present

## 2019-02-22 DIAGNOSIS — Z20828 Contact with and (suspected) exposure to other viral communicable diseases: Secondary | ICD-10-CM | POA: Diagnosis not present

## 2019-03-28 MED ORDER — RIZATRIPTAN BENZOATE 10 MG PO TABS: 10.0000 mg | ORAL_TABLET | ORAL | 2 refills | Status: DC | PRN

## 2019-03-28 MED FILL — RIZATRIPTAN BENZOATE 10 MG: 10 | 30 days supply | Qty: 10 | Fill #0

## 2019-03-31 ENCOUNTER — Ambulatory Visit: Payer: Self-pay

## 2019-03-31 ENCOUNTER — Other Ambulatory Visit: Payer: Self-pay

## 2019-03-31 ENCOUNTER — Ambulatory Visit
Admission: RE | Admit: 2019-03-31 | Discharge: 2019-03-31 | Disposition: A | Discharge status: Home / Self Care | Payer: 59 | Source: Ambulatory Visit | Attending: Family Medicine | Admitting: Family Medicine

## 2019-03-31 ENCOUNTER — Ambulatory Visit (INDEPENDENT_AMBULATORY_CARE_PROVIDER_SITE_OTHER): Payer: 59 | Admitting: Family Medicine

## 2019-03-31 ENCOUNTER — Other Ambulatory Visit: Payer: Self-pay | Admitting: Family Medicine

## 2019-03-31 VITALS — BP 162/100 | Ht 67.0 in | Wt 254.0 lb

## 2019-03-31 DIAGNOSIS — M25561 Pain in right knee: Secondary | ICD-10-CM

## 2019-03-31 DIAGNOSIS — M1711 Unilateral primary osteoarthritis, right knee: Secondary | ICD-10-CM | POA: Diagnosis not present

## 2019-03-31 DIAGNOSIS — M25569 Pain in unspecified knee: Secondary | ICD-10-CM | POA: Insufficient documentation

## 2019-03-31 NOTE — Progress Notes (Signed)
    SUBJECTIVE:   CHIEF COMPLAINT: knee pain  HPI:   Patient reports swelling of her right knee since September.  States she notes she cannot bend her leg all the way anymore.  She enjoys running.  Ran 5 miles in September and noted swelling after this.  She also goes to the gym and she cannot squat like she used to anymore.  Went to the gym on Sunday and Monday felt discomfort around her knee.  She has been wearing a knee brace which helps.  Notes crunching in her knee while walking up steps.  She has not tried any medications for this.  She denies any known trauma or injury.  Has never had any imaging of her knees before.  Denies recent immobility.  PERTINENT  PMH / PSH: HTN, migraine, obesity, anxiety  OBJECTIVE:   BP (!) 162/100   Ht 5\' 7"  (1.702 m)   Wt 254 lb (115.2 kg)   BMI 39.78 kg/m   GEN: Obese, in NAD MSK: Knee: Inspection: Slight fullness noted to right suprapatellar area as compared to the left.  No overlying skin changes, redness. Palpation:nonTTP along bilateral joint lines, with patellar compression ROM: Full AROM Strength: 5/5 strength with flexion, extension.  4/5 with hip abduction. Stability: No joint laxity Special Tests: -Anterior/posterior drawer negative -Lachman's negative -Thessaly's negative -FADIR/FABER negative Neurovascular: intact  Limited ultrasound of right knee Marked effusion of suprapatellar pouch with intact quad tendon.  No bony abnormalities appreciated with patella, femur, tibia.  Bulging of bilateral menisci, lateral> medial with surrounding effusion and bulging of collateral ligaments.  No appreciable Baker's cyst.   ASSESSMENT/PLAN:   Knee pain History consistent with likely OA however also with bulging of bilateral menisci and extensive joint effusion on ultrasound.  Will obtain standing knee x-rays and MRI for further evaluation.  Can continue knee brace for support and comfort.  Use NSAIDs as needed.  Will call with results and  indicated follow-up.   , DO [facility]

## 2019-03-31 NOTE — Assessment & Plan Note (Signed)
History consistent with likely OA however also with bulging of bilateral menisci and extensive joint effusion on ultrasound.  Will obtain standing knee x-rays and MRI for further evaluation.  Can continue knee brace for support and comfort.  Use NSAIDs as needed.  Will call with results and indicated follow-up.

## 2019-03-31 NOTE — Patient Instructions (Addendum)
It was great to see you!  Our plans for today:  - We are ordering knee xrays. You can get these done today at [facility]. We will call you with these results.  - Call [facility] to set up your MRI appointment.  - You can take ibuprofen as needed for pain.  Take care and seek immediate care sooner if you develop any concerns.

## 2019-03-31 NOTE — Progress Notes (Signed)
[facility]: Attending Note: I have reviewed the chart, discussed wit the Sports Medicine Fellow. I agree with assessment and treatment plan as detailed in the Fellow's note. Given her symptoms started in September and have not really improved despite conservative therapy, I suspect this is either patellofemoral joint arthritis or meniscal issue. She does have some fluid today noted on the ultrasound. Her lateral meniscus also is not well seen. Medial meniscus looks normal. She does have some osteophytes at the joint line. We will get some x-rays. Unless there is something on those that I do not expect, we will proceed with MRI and follow-up afterward. She is on board with this plan. I answered all her questions.

## 2019-04-03 ENCOUNTER — Telehealth: Payer: Self-pay | Admitting: Medical

## 2019-04-03 NOTE — Telephone Encounter (Signed)
Got pt scheduled for thursday

## 2019-04-03 NOTE — Telephone Encounter (Signed)
Get in for follow up high BP per ortho notes.

## 2019-04-04 ENCOUNTER — Encounter: Payer: Self-pay | Admitting: Family Medicine

## 2019-04-04 NOTE — Progress Notes (Signed)
I suspect the issue is the osteoarthritis in the patello-femoral joint as we discussed. I will show you your films at your follow-up appointment. I think this IS the reason you are having some of your knee pain.

## 2019-04-06 ENCOUNTER — Ambulatory Visit: Payer: 59 | Admitting: Medical

## 2019-04-07 ENCOUNTER — Ambulatory Visit: Payer: 59 | Admitting: Medical

## 2019-04-07 ENCOUNTER — Other Ambulatory Visit: Payer: Self-pay

## 2019-04-07 ENCOUNTER — Telehealth: Payer: Self-pay | Admitting: Medical

## 2019-04-07 ENCOUNTER — Encounter: Payer: Self-pay | Admitting: Medical

## 2019-04-07 VITALS — BP 154/100 | HR 50 | Temp 97.7°F | Wt 261.7 lb

## 2019-04-07 DIAGNOSIS — R7301 Impaired fasting glucose: Secondary | ICD-10-CM

## 2019-04-07 DIAGNOSIS — M7989 Other specified soft tissue disorders: Secondary | ICD-10-CM | POA: Diagnosis not present

## 2019-04-07 DIAGNOSIS — I10 Essential (primary) hypertension: Secondary | ICD-10-CM | POA: Diagnosis not present

## 2019-04-07 DIAGNOSIS — E559 Vitamin D deficiency, unspecified: Secondary | ICD-10-CM | POA: Diagnosis not present

## 2019-04-07 MED ORDER — BISOPROLOL-HYDROCHLOROTHIAZIDE 10-6.25 MG PO TABS: 1.0000 | ORAL_TABLET | Freq: Every day | ORAL | 2 refills | Status: DC

## 2019-04-07 MED ORDER — MUPIROCIN 2 % EX OINT: 1.0000 "application " | TOPICAL_OINTMENT | Freq: Three times a day (TID) | CUTANEOUS | 0 refills | Status: DC

## 2019-04-07 MED ORDER — AMOXICILLIN-POT CLAVULANATE 875-125 MG PO TABS: 1.0000 | ORAL_TABLET | Freq: Two times a day (BID) | ORAL | 0 refills | Status: DC

## 2019-04-07 MED FILL — AMOX-CLAV 875-125 MG TABLET: 875-125 | 10 days supply | Qty: 20 | Fill #0

## 2019-04-07 MED FILL — BISOPROLOL-HCTZ 10-6.25 MG: 10-6.25 | 30 days supply | Qty: 30 | Fill #0

## 2019-04-07 MED FILL — MUPIROCIN 2% OINTMENT: 2 | 7 days supply | Qty: 22 | Fill #0

## 2019-04-07 NOTE — Progress Notes (Signed)
Subjective:  Christine Fields is a 39 y.o. female who presents for Chief Complaint  Patient presents with  . Hypertension     Here today at my request for elevated blood pressure.  She was seen by orthopedics in the past week and had elevated pressures at their office.  She notes home readings typically in the 140/90 range.  She notes that she is trying really hard to eat healthy, does not eat red meat, has done better at walking, water intake.  She is frustrated that her weight is not improved.  She denies chest pain, dyspnea, edema, paresthesias.  She does forget to take her evening dose of metoprolol sometimes.  She reports being compliant with hydrochlorothiazide.  No other aggravating or relieving factors.    No other c/o.  Past Medical History:  Diagnosis Date  . Alkaline phosphatase elevation 2021  . Hypertension   . Migraine   . Vitamin D deficiency    Current Outpatient Medications on File Prior to Visit  Medication Sig Dispense Refill  . hydrochlorothiazide (HYDRODIURIL) 12.5 MG tablet Take 1 tablet (12.5 mg total) by mouth daily. 90 tablet 3  . levonorgestrel (MIRENA, 52 MG,) 20 MCG/24HR IUD     . metoprolol tartrate (LOPRESSOR) 25 MG tablet Take 1 tablet (25 mg total) by mouth 2 (two) times daily. 1 tablet po BID for blood pressure 180 tablet 3  . rizatriptan (MAXALT) 10 MG tablet Take 1 tablet (10 mg total) by mouth as needed for migraine. May repeat in 2 hours if needed 10 tablet 2  . cholecalciferol (VITAMIN D3) 25 MCG (1000 UT) tablet Take 1 tablet (1,000 Units total) by mouth daily. (Patient not taking: Reported on 03/31/2019) 90 tablet 3   No current facility-administered medications on file prior to visit.    The following portions of the patient's history were reviewed and updated as appropriate: allergies, current medications, past family history, past medical history, past social history, past surgical history and problem list.  ROS Otherwise as in subjective  above  Objective: BP (!) 154/100   Pulse (!) 50   Temp 97.7 F (36.5 C)   Wt 261 lb 11.2 oz (118.7 kg)   SpO2 96%   BMI 40.99 kg/m   General appearance: alert, no distress, well developed, well nourished Neck: supple, no lymphadenopathy, no thyromegaly, no masses Heart: RRR, normal S1, S2, no murmurs Lungs: CTA bilaterally, no wheezes, rhonchi, or rales Pulses: 2+ radial pulses, 2+ pedal pulses, normal cap refill Ext: no edema Right middle finger with general swelling but worse in the distal phalanx which is tense, slight erythema at the base of the nail dorsally, no deformity, normal range of motion, not particularly tender, wrist and hand unremarkable   Assessment: Encounter Diagnoses  Name Primary?  . Essential hypertension, benign Yes  . Impaired fasting blood sugar   . Vitamin D deficiency   . Finger swelling      Plan: Hypertension-stop plain metoprolol and hydrochlorothiazide and change to bisoprolol hydrochlorothiazide as below.  Monitor blood pressures at home.  Counseled on Mediterranean diet, vegan diet, exercise, limiting salt, regular exercise, and continuing efforts to lose weight  Impaired glucose-plan to recheck lab at her next visit which should be her physical  Vitamin D deficiency-she is not currently taking vitamin D.  Advise she begin supplement  Finger swelling-continue ibuprofen the next 2 days, continue ice water therapy.  Begin ambulation topically around the nailbed as there is mild erythema.  If worse in the  next 48 hours begin Augmentin antibiotic or recheck.  Christine Fields was seen today for hypertension.  Diagnoses and all orders for this visit:  Essential hypertension, benign  Impaired fasting blood sugar  Vitamin D deficiency  Finger swelling  Other orders -     bisoprolol-hydrochlorothiazide (ZIAC) 10-6.25 MG tablet; Take 1 tablet by mouth daily. -     amoxicillin-clavulanate (AUGMENTIN) 875-125 MG tablet; Take 1 tablet by mouth 2 (two)  times daily. -     mupirocin ointment (BACTROBAN) 2 %; Apply 1 application topically 3 (three) times daily.    Follow up: BP call report 32mo

## 2019-04-07 NOTE — Telephone Encounter (Signed)
Refer to weight management clinic

## 2019-04-11 ENCOUNTER — Other Ambulatory Visit: Payer: Self-pay

## 2019-04-11 DIAGNOSIS — I10 Essential (primary) hypertension: Secondary | ICD-10-CM

## 2019-04-11 DIAGNOSIS — R7301 Impaired fasting glucose: Secondary | ICD-10-CM

## 2019-04-11 DIAGNOSIS — E669 Obesity, unspecified: Secondary | ICD-10-CM

## 2019-04-11 NOTE — Progress Notes (Signed)
Na

## 2019-04-11 NOTE — Telephone Encounter (Signed)
Referral has been placed. 

## 2019-04-16 ENCOUNTER — Ambulatory Visit (INDEPENDENT_AMBULATORY_CARE_PROVIDER_SITE_OTHER)
Admission: RE | Admit: 2019-04-16 | Discharge: 2019-04-16 | Disposition: A | Discharge status: Home / Self Care | Payer: 59 | Source: Ambulatory Visit

## 2019-04-16 DIAGNOSIS — Z975 Presence of (intrauterine) contraceptive device: Secondary | ICD-10-CM

## 2019-04-16 DIAGNOSIS — R350 Frequency of micturition: Secondary | ICD-10-CM | POA: Diagnosis not present

## 2019-04-16 DIAGNOSIS — R399 Unspecified symptoms and signs involving the genitourinary system: Secondary | ICD-10-CM | POA: Diagnosis not present

## 2019-04-16 DIAGNOSIS — R3915 Urgency of urination: Secondary | ICD-10-CM

## 2019-04-16 MED ORDER — CEPHALEXIN 500 MG PO CAPS: 500.0000 mg | ORAL_CAPSULE | Freq: Two times a day (BID) | ORAL | 0 refills | Status: DC

## 2019-04-16 MED ORDER — FLUCONAZOLE 150 MG PO TABS: 150.0000 mg | ORAL_TABLET | Freq: Every day | ORAL | 0 refills | Status: DC

## 2019-04-16 NOTE — Discharge Instructions (Signed)
As discussed, treating you clinically for urinary tract infection based off of your symptoms.  Start Keflex as directed. Keep hydrated, urine should be clear to pale yellow in color.  If symptoms not improving, or returns after you finish your antibiotics, will need to be evaluated in person. Monitor for any worsening of symptoms, fever, worsening abdominal pain, nausea/vomiting, flank pain, follow up for reevaluation.   [facility] [place] ([place] [address], [place], [place] [address] [phone]  [facility] [place] [address], [place], [place] [address] [phone]  [facility] [place] [address] [place], [place] [address] [phone]  [facility] [place] [address], [place], [place] [address] [phone]  [facility] [place] [address], [place], [place] [address] [phone]  [facility] [place] [address], [place], [place] [address] [phone]  

## 2019-04-16 NOTE — ED Provider Notes (Signed)
Virtual Visit via Video Note:  Christine Fields  initiated request for Telemedicine visit with [facility]gent [facility] team. I connected with Christine Fields  on 04/16/2019 at 3:28 PM  for a synchronized telemedicine visit using a video enabled HIPPA compliant telemedicine application. I verified that I am speaking with Christine Fields  using two identifiers. Christine Oblinger Doree Fudge, Christine Fields  was physically located in a [facility] Urgent care site and Christine Fields was located at a different location.   The limitations of evaluation and management by telemedicine as well as the availability of in-person appointments were discussed. Patient was informed that she  may incur a bill ( including co-pay) for this virtual visit encounter. Christine Fields  expressed understanding and gave verbal consent to proceed with virtual visit.     History of Present Illness:Christine Fields  is a 39 y.o. female presents with 1 week history of urinary changes. Has had strong urine odor, urinary frequency/urgency. Denies dysuria, hematuria. Denies abdominal pain, nausea, vomiting, diarrhea. Denies fever, chills, back/flank pain. Denies vaginal discharge, itching, spotting. IUD.   Past Medical History:  Diagnosis Date  . Alkaline phosphatase elevation 2021  . Hypertension   . Migraine   . Vitamin D deficiency     Allergies  Allergen Reactions  . Amlodipine     Headache   . Kiwi Extract     Tongue and mouth burning/irritation        Observations/Objective: General: Well appearing, nontoxic, no acute distress. Sitting comfortably. Head: Normocephalic, atraumatic Eye: No conjunctival injection, eyelid swelling. EOMI ENT: Mucus membranes moist, no lip cracking. No obvious nasal drainage. Pulm: Speaking in full sentences without difficulty. Normal effort. No respiratory distress, accessory muscle use. Neuro: Normal mental status. Alert and oriented x 3.  Assessment and Plan: We will treat clinically for  cystitis based off symptoms.  Start Keflex as directed.  Push fluids.  Discussed with patient, if symptoms does not improve, or returns after course of antibiotics, will need to be seen in person.  Otherwise return precautions given.  Patient expresses understanding and agrees to plan.  Follow Up Instructions:    I discussed the assessment and treatment plan with the patient. The patient was provided an opportunity to ask questions and all were answered. The patient agreed with the plan and demonstrated an understanding of the instructions.   The patient was advised to call back or seek an in-person evaluation if the symptoms worsen or if the condition fails to improve as anticipated.  I provided 10 minutes of non-face-to-face time during this encounter.    Belinda Fisher, Christine Fields  04/16/2019 3:28 PM         Belinda Fisher, Christine Fields 04/16/19 1538

## 2019-04-17 ENCOUNTER — Other Ambulatory Visit: Payer: Self-pay

## 2019-04-17 ENCOUNTER — Telehealth: Payer: Self-pay | Admitting: *Deleted

## 2019-04-17 ENCOUNTER — Ambulatory Visit
Admission: RE | Admit: 2019-04-17 | Discharge: 2019-04-17 | Disposition: A | Discharge status: Home / Self Care | Payer: 59 | Source: Ambulatory Visit | Attending: Family Medicine | Admitting: Family Medicine

## 2019-04-17 DIAGNOSIS — M23321 Other meniscus derangements, posterior horn of medial meniscus, right knee: Secondary | ICD-10-CM | POA: Diagnosis not present

## 2019-04-17 DIAGNOSIS — M25561 Pain in right knee: Secondary | ICD-10-CM

## 2019-04-18 NOTE — Telephone Encounter (Signed)
Gave patient the results of her MRI. She will think about the options given the findings on the MRI and contact me/us back with an action plan.

## 2019-04-18 NOTE — Telephone Encounter (Signed)
[place] / Aundra Millet She has tricmpartmental osteoarthritis which is moderate to severe.  She has a BIG meniscal tear. She also has a prtial tear of her PCL and her popliteus tendon. She needs surgery, probably a total knee altho given her age they might try an arthroscope. Please find out which irthopedist she wants tosee as thee is nothing else we can do for her. Let meknow if she has questions and I can call her tomorrow PM if she does. THANKS! Denny Levy

## 2019-04-19 NOTE — Telephone Encounter (Signed)
[facility] Yisroel Ramming [address] [place]  Friday 04/21/19 @ 915am Arrival time is 845am [phone] Faxed notes and MRI results to [phone]  Patient aware

## 2019-04-20 ENCOUNTER — Telehealth: Payer: Self-pay | Admitting: Medical

## 2019-04-20 MED FILL — OMRON 3 SERIES BP MONITOR D: 30 days supply | Qty: 1 | Fill #0

## 2019-04-20 NOTE — Telephone Encounter (Signed)
Pt called and wants to know if you can send a rx for a blood pressure monitoring so she can file that with her insurance pt can be reached at [phone]

## 2019-04-21 DIAGNOSIS — Z6835 Body mass index (BMI) 35.0-35.9, adult: Secondary | ICD-10-CM | POA: Diagnosis not present

## 2019-04-21 DIAGNOSIS — M25561 Pain in right knee: Secondary | ICD-10-CM | POA: Diagnosis not present

## 2019-04-21 NOTE — Telephone Encounter (Signed)
Called and informed pt that rx was ready

## 2019-04-21 NOTE — Telephone Encounter (Signed)
Rx handwritten

## 2019-05-01 ENCOUNTER — Encounter: Payer: Self-pay | Admitting: Orthopedic Surgery

## 2019-05-01 ENCOUNTER — Telehealth: Payer: Self-pay | Admitting: Orthopedic Surgery

## 2019-05-01 ENCOUNTER — Ambulatory Visit (INDEPENDENT_AMBULATORY_CARE_PROVIDER_SITE_OTHER): Payer: 59 | Admitting: Orthopedic Surgery

## 2019-05-01 ENCOUNTER — Other Ambulatory Visit: Payer: Self-pay

## 2019-05-01 VITALS — Ht 67.25 in | Wt 262.0 lb

## 2019-05-01 DIAGNOSIS — S83241A Other tear of medial meniscus, current injury, right knee, initial encounter: Secondary | ICD-10-CM

## 2019-05-01 DIAGNOSIS — S83249A Other tear of medial meniscus, current injury, unspecified knee, initial encounter: Secondary | ICD-10-CM

## 2019-05-01 NOTE — Progress Notes (Signed)
Office Visit Note   Patient: Christine Fields           Date of Birth: [birth year]           MRN: [id number] Visit Date: 05/01/2019 Requested by: Carlena Hurl, PA-C [address] [place],  [place] [address] PCP: Carlena Hurl, PA-C  Subjective: Chief Complaint  Patient presents with  . Right Knee - Pain    HPI: Christine Fields is a patient here for second opinion regarding her right knee.  She has had pain since August 2020 but denies any discrete injury.  She has been told that she has a PCL tear.  MRI scan is reviewed.  She did have an injection on 04/21/2019 which gave her 5 days of relief.  Not taking much in the way of medication for the pain.  She does like to try to run for exercise.  She also likes to walk for exercise.  She reports primarily pain in the knee but no mechanical symptoms.  MRI scan is reviewed.  She does have intact ACL and PCL.  Effusion is present.  Lateral compartment appears intact.  Collaterals are intact.  She does have medial compartment arthritis which is moderate as well as medial meniscal tear with no displaced fragment.  Posterior meniscal root appears intact on the MRI scan.  Patient states that she wants her right knee to feel like her asymptomatic left knee.              ROS: All systems reviewed are negative as they relate to the chief complaint within the history of present illness.  Patient denies  fevers or chills.   Assessment & Plan: Visit Diagnoses:  1. Acute medial meniscal tear, initial encounter     Plan: Impression is degenerative meniscal tear and medial compartment arthritis in a patient who has increased body mass index and is doing what sounds like a fair amount of loadbearing exercise.  She is having anterior medial pain as well as morning stiffness.  This is a difficult situation for Christine Fields.  In general I told her that impact exercises such as running and to some degree even walking for exercise is not advisable.  Swimming and stationary  bike would be a better way for her to burn calories.  Elliptical is a distant third and I do not recommend treadmill or running at this time.  I think weight loss would also be helpful for her.  Quad strengthening also would be helpful.  Regarding operative intervention I think it is more likely than not that arthroscopy and meniscal debridement would help her.  Again we are in the conundrum of how much of her pain is coming from the arthritis how much is coming from the medial meniscal tear.  Again with no mechanical symptoms and only an effusion I think is possible that most of her symptoms may be coming from the arthritis in her knee.  Nonetheless, if injections are not working and she is at the point where she needs further intervention I would definitely consider arthroscopy before any type of major reconstructive replacement surgery.  She is going to consider her options.  I think knee aspiration and gel injection is another option to try to buy some time while activity modification can potentially take effect.  I also think taking an anti-inflammatory would be helpful with the effusion.  Follow-Up Instructions: Return if symptoms worsen or fail to improve.   Orders:  No orders of the defined types  were placed in this encounter.  No orders of the defined types were placed in this encounter.     Procedures: No procedures performed   Clinical Data: No additional findings.  Objective: Vital Signs: Ht 5' 7.25" (1.708 m)   Wt 262 lb (118.8 kg)   BMI 40.73 kg/m   Physical Exam:   Constitutional: Patient appears well-developed HEENT:  Head: Normocephalic Eyes:EOM are normal Neck: Normal range of motion Cardiovascular: Normal rate Pulmonary/chest: Effort normal Neurologic: Patient is alert Skin: Skin is warm Psychiatric: Patient has normal mood and affect    Ortho Exam: Ortho exam demonstrates normal gait alignment.  She has mild effusion right knee no effusion left knee.   Symmetric patellofemoral crepitus.  Anteromedial joint line tenderness on the right.  Stable collateral and cruciate ligaments.  Extensor mechanism is intact.  No push on rotatory instability is noted on the right knee.  Patient does have anteromedial and to lesser degree posterior medial joint line tenderness.  Range of motion is full.  Alignment intact.  Specialty Comments:  No specialty comments available.  Imaging: No results found.   PMFS History: Patient Active Problem List   Diagnosis Date Noted  . Finger swelling 04/07/2019  . Knee pain 03/31/2019  . Impaired fasting blood sugar 07/06/2018  . Vitamin D deficiency 07/06/2018  . Panic attack 10/12/2017  . Test anxiety 10/12/2017  . Palpitation 10/12/2017  . Encounter for health maintenance examination in adult 05/24/2017  . IUD (intrauterine device) in place 05/24/2017  . Obesity with serious comorbidity 05/24/2017  . Essential hypertension, benign 03/02/2017  . Migraine without status migrainosus, not intractable 03/02/2017   Past Medical History:  Diagnosis Date  . Alkaline phosphatase elevation 2021  . Hypertension   . Migraine   . Vitamin D deficiency     Family History  Problem Relation Age of Onset  . Diabetes Father   . Hypertension Father   . Stroke Father   . Hypertension Maternal Grandmother   . Diabetes Maternal Grandfather   . Cancer Maternal Grandfather     Past Surgical History:  Procedure Laterality Date  . CESAREAN SECTION     Social History   Occupational History  . Not on file  Tobacco Use  . Smoking status: Never Smoker  . Smokeless tobacco: Never Used  Substance and Sexual Activity  . Alcohol use: Yes    Alcohol/week: 1.0 standard drinks    Types: 1 Glasses of wine per week    Comment: occasionally  . Drug use: No  . Sexual activity: Not Currently

## 2019-05-01 NOTE — Telephone Encounter (Signed)
Made in error

## 2019-05-03 ENCOUNTER — Ambulatory Visit (INDEPENDENT_AMBULATORY_CARE_PROVIDER_SITE_OTHER): Payer: 59 | Admitting: Bariatrics

## 2019-05-03 ENCOUNTER — Other Ambulatory Visit: Payer: Self-pay

## 2019-05-03 ENCOUNTER — Encounter (INDEPENDENT_AMBULATORY_CARE_PROVIDER_SITE_OTHER): Payer: Self-pay | Admitting: Bariatrics

## 2019-05-03 VITALS — BP 155/98 | HR 75 | Temp 98.8°F | Ht 67.0 in | Wt 257.0 lb

## 2019-05-03 DIAGNOSIS — R7303 Prediabetes: Secondary | ICD-10-CM | POA: Diagnosis not present

## 2019-05-03 DIAGNOSIS — F3289 Other specified depressive episodes: Secondary | ICD-10-CM | POA: Diagnosis not present

## 2019-05-03 DIAGNOSIS — R0602 Shortness of breath: Secondary | ICD-10-CM | POA: Diagnosis not present

## 2019-05-03 DIAGNOSIS — Z9189 Other specified personal risk factors, not elsewhere classified: Secondary | ICD-10-CM | POA: Diagnosis not present

## 2019-05-03 DIAGNOSIS — E559 Vitamin D deficiency, unspecified: Secondary | ICD-10-CM

## 2019-05-03 DIAGNOSIS — Z6841 Body Mass Index (BMI) 40.0 and over, adult: Secondary | ICD-10-CM | POA: Diagnosis not present

## 2019-05-03 DIAGNOSIS — I10 Essential (primary) hypertension: Secondary | ICD-10-CM | POA: Diagnosis not present

## 2019-05-03 DIAGNOSIS — R5383 Other fatigue: Secondary | ICD-10-CM | POA: Diagnosis not present

## 2019-05-03 DIAGNOSIS — Z0289 Encounter for other administrative examinations: Secondary | ICD-10-CM

## 2019-05-03 NOTE — Progress Notes (Signed)
Dear Christine Bode, PA-C,   Thank you for referring Christine Fields to our clinic. The following note includes my evaluation and treatment recommendations.  Chief Complaint:   OBESITY Christine Fields (MR# [id number]) is a 39 y.o. female who presents for evaluation and treatment of obesity and related comorbidities. Current BMI is Body mass index is 40.25 kg/m.Marland Kitchen Christine Fields has been struggling with her weight for many years and has been unsuccessful in either losing weight, maintaining weight loss, or reaching her healthy weight goal.  Christine Fields is currently in the action stage of change and ready to dedicate time achieving and maintaining a healthier weight. Christine Fields is interested in becoming our patient and working on intensive lifestyle modifications including (but not limited to) diet and exercise for weight loss.  Christine Fields craves sweets. She likes to cook, but notes obstacles as time. She skips breakfast. 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 She says she enjoys cake when she is feeling bad. She drinks sweet tea a lot and snacks a lot when working. She loves chips and salsa.  Christine Fields's habits were reviewed today and are as follows: Her family eats meals together, she thinks her family will eat healthier with her, her desired weight loss is 77 lbs, she started gaining weight after her divorce, her heaviest weight ever was 272 pounds, she craves cake, she skips breakfast all the time, she is frequently drinking liquids with calories, she frequently makes poor food choices and she sometimes eats larger portions than normal.  Depression Screen Christine Fields's Food and Mood (modified PHQ-9) score was 10.  Depression screen Christine Fields 2/9 05/03/2019  Decreased Interest 2  Down, Depressed, Hopeless 1  PHQ - 2 Score 3  Altered sleeping 1  Tired, decreased energy 1  Change in appetite 2  Feeling bad or failure about yourself  3  Trouble concentrating 0  Moving slowly or fidgety/restless 0  Suicidal  thoughts 0  PHQ-9 Score 10  Difficult doing work/chores Not difficult at all   Subjective:   Other fatigue. Christine Fields denies daytime somnolence and admits to waking up still tired. Christine Fields generally gets 5-6 hours of sleep per night, and states that she does not sleep well most nights. Apneic episodes are not present. [name] is 9.  Shortness of breath on exertion. Christine Fields notes increasing shortness of breath with certain activities and seems to be worsening over time with weight gain. She notes getting out of breath sooner with activity than she used to. This has not gotten worse recently. Christine Fields denies shortness of breath at rest or orthopnea.  Essential hypertension. Christine Fields is taking bisoprolol-HCTZ for her blood pressure, which is not well controlled. She states she did not take her medication today.  BP Readings from Last 3 Encounters:  05/03/19 (!) 155/98  04/07/19 (!) 154/100  03/31/19 (!) 162/100   Lab Results  Component Value Date   CREATININE 0.85 07/06/2018   CREATININE 0.67 05/24/2017   CREATININE 0.78 05/16/2011   Christine Fields D deficiency. Christine Fields is taking Christine Fields D.  Prediabetes. Christine Fields has a diagnosis of prediabetes based on her elevated HgA1c and was informed this puts her at greater risk of developing diabetes. She continues to work on diet and exercise to decrease her risk of diabetes. She denies nausea or hypoglycemia.  Lab Results  Component Value Date   HGBA1C 6.0 (H) 07/06/2018   No results found for: INSULIN  Other depression, with emotional eating. Christine Fields is struggling with emotional eating and using  food for comfort to the extent that it is negatively impacting her health. She has been working on behavior modification techniques to help reduce her emotional eating and has been somewhat successful. She shows no sign of suicidal or homicidal ideations. Christine Fields reports stress and emotional eating.  At risk for diabetes mellitus. Christine Fields is at higher than  average risk for developing diabetes due to her prediabetes and obesity.   Assessment/Plan:   Other fatigue. Christine Fields does not feel that her weight is causing her energy to be lower than it should be. Fatigue may be related to obesity, depression or many other causes. Labs will be ordered, and in the meanwhile, Christine Fields will focus on self care including making healthy food choices, increasing physical activity and focusing on stress reduction. EKG 12-Lead, CBC with Differential/Platelet, T3, T4, free, TSH testing ordered today.  Shortness of breath on exertion. Christine Fields does not feel that she gets out of breath more easily that she used to when she exercises. Christine Fields's shortness of breath appears to be obesity related and exercise induced. She has agreed to work on weight loss and gradually increase exercise to treat her exercise induced shortness of breath. Will continue to monitor closely. Lipid Panel With LDL/HDL Ratio labs ordered today.  Essential hypertension. Christine Fields is working on healthy weight loss and exercise to improve blood pressure control. We will watch for signs of hypotension as she continues her lifestyle modifications. She was instructed to resume her medication and take everyday as directed.  Christine Fields D deficiency. Low Christine Fields D level contributes to fatigue and are associated with obesity, breast, and colon cancer. Christine Fields D 25 Hydroxy (Vit-D Deficiency, Fractures) level ordered today.  Prediabetes. Christine Fields will continue to work on weight loss, exercise, and decreasing simple carbohydrates to help decrease the risk of diabetes. Comprehensive metabolic panel, Hemoglobin A1c, Insulin, random labs ordered today.  Other depression, with emotional eating. Behavior modification techniques were discussed today to help Christine Fields deal with her emotional/non-hunger eating behaviors.  Orders and follow up as documented in patient record. Will refer to  Dr. Dewaine Conger, our bariatric psychologist, for  evaluation.  At risk for diabetes mellitus. Christine Fields was given approximately 15 minutes of diabetes education and counseling today. We discussed intensive lifestyle modifications today with an emphasis on weight loss as well as increasing exercise and decreasing simple carbohydrates in her diet. We also reviewed medication options with an emphasis on risk versus benefit of those discussed.   Repetitive spaced learning was employed today to elicit superior memory formation and behavioral change.  Class 3 severe obesity with serious comorbidity and body mass index (BMI) of 40.0 to 44.9 in adult, unspecified obesity type (HCC).  Christine Fields is currently in the action stage of change and her goal is to continue with weight loss efforts. I recommend Christine Fields begin the structured treatment plan as follows:  She has agreed to the Category 2 Plan.  She will work on meal planning and stop all sugary drinks.  We independently reviewed with the patient labs from 07/06/2018 including CMP, lipids, Christine Fields D, CBC, and A1c.  Exercise goals: All adults should avoid inactivity. Some physical activity is better than none, and adults who participate in any amount of physical activity gain some health benefits.   Behavioral modification strategies: increasing lean protein intake, decreasing simple carbohydrates, increasing vegetables, increasing water intake, decreasing eating out, no skipping meals, meal planning and cooking strategies, keeping healthy foods in the home and planning for success.  She was informed of the  importance of frequent follow-up visits to maximize her success with intensive lifestyle modifications for her multiple health conditions. She was informed we would discuss her lab results at her next visit unless there is a critical issue that needs to be addressed sooner. Nahara agreed to keep her next visit at the agreed upon time to discuss these results.  Objective:   Blood pressure (!) 155/98,  pulse 75, temperature 98.8 F (37.1 C), height 5\' 7"  (1.702 m), weight 257 lb (116.6 kg), SpO2 98 %. Body mass index is 40.25 kg/m.  EKG: Sinus  Rhythm with a rate of 68 BPM. Poor R-wave progression - nonspecific - consider old anterior infarct. Borderline.  Indirect Calorimeter completed today shows a VO2 of 210 and a REE of 1459.  Her calculated basal metabolic rate is thus her basal metabolic rate is worse than expected.  General: Cooperative, alert, well developed, in no acute distress. HEENT: Conjunctivae and lids unremarkable. Cardiovascular: Regular rhythm.  Lungs: Normal work of breathing. Neurologic: No focal deficits.   Lab Results  Component Value Date   CREATININE 0.85 07/06/2018   BUN 12 07/06/2018   NA 140 07/06/2018   K 3.8 07/06/2018   CL 102 07/06/2018   CO2 23 07/06/2018   Lab Results  Component Value Date   ALT 9 07/06/2018   AST 10 07/06/2018   ALKPHOS 71 07/06/2018   BILITOT 1.0 07/06/2018   Lab Results  Component Value Date   HGBA1C 6.0 (H) 07/06/2018   HGBA1C 5.7 (H) 05/24/2017   No results found for: INSULIN Lab Results  Component Value Date   TSH 2.240 05/24/2017   Lab Results  Component Value Date   CHOL 167 07/06/2018   HDL 38 (L) 07/06/2018   LDLCALC 117 (H) 07/06/2018   TRIG 58 07/06/2018   CHOLHDL 4.4 07/06/2018   Lab Results  Component Value Date   WBC 7.9 07/06/2018   HGB 13.3 07/06/2018   HCT 40.4 07/06/2018   MCV 84 07/06/2018   PLT 227 07/06/2018   No results found for: IRON, TIBC, FERRITIN  Attestation Statements:   Reviewed by clinician on day of visit: allergies, medications, problem list, medical history, surgical history, family history, social history, and previous encounter notes.  07/08/2018, am acting as Fernanda Drum for Energy manager, DO   I have reviewed the above documentation for accuracy and completeness, and I agree with the above. [employer], DO

## 2019-05-04 ENCOUNTER — Encounter (INDEPENDENT_AMBULATORY_CARE_PROVIDER_SITE_OTHER): Payer: Self-pay | Admitting: Bariatrics

## 2019-05-04 DIAGNOSIS — E786 Lipoprotein deficiency: Secondary | ICD-10-CM | POA: Insufficient documentation

## 2019-05-04 LAB — CBC WITH DIFFERENTIAL/PLATELET
Basophils Absolute: 0 10*3/uL (ref 0.0–0.2)
Basos: 0 %
EOS (ABSOLUTE): 0.1 10*3/uL (ref 0.0–0.4)
Eos: 1 %
Hematocrit: 42.3 % (ref 34.0–46.6)
Hemoglobin: 13.8 g/dL (ref 11.1–15.9)
Immature Grans (Abs): 0 10*3/uL (ref 0.0–0.1)
Immature Granulocytes: 0 %
Lymphocytes Absolute: 2.5 10*3/uL (ref 0.7–3.1)
Lymphs: 27 %
MCH: 28.6 pg (ref 26.6–33.0)
MCHC: 32.6 g/dL (ref 31.5–35.7)
MCV: 88 fL (ref 79–97)
Monocytes Absolute: 0.5 10*3/uL (ref 0.1–0.9)
Monocytes: 5 %
Neutrophils Absolute: 6 10*3/uL (ref 1.4–7.0)
Neutrophils: 67 %
Platelets: 206 10*3/uL (ref 150–450)
RBC: 4.83 x10E6/uL (ref 3.77–5.28)
RDW: 13.6 % (ref 11.7–15.4)
WBC: 9.1 10*3/uL (ref 3.4–10.8)

## 2019-05-04 LAB — COMPREHENSIVE METABOLIC PANEL
ALT: 9 IU/L (ref 0–32)
AST: 8 IU/L (ref 0–40)
Albumin/Globulin Ratio: 1.9 (ref 1.2–2.2)
Albumin: 4.5 g/dL (ref 3.8–4.8)
Alkaline Phosphatase: 64 IU/L (ref 39–117)
BUN/Creatinine Ratio: 18 (ref 9–23)
BUN: 14 mg/dL (ref 6–24)
Bilirubin Total: 0.7 mg/dL (ref 0.0–1.2)
CO2: 21 mmol/L (ref 20–29)
Calcium: 9.5 mg/dL (ref 8.7–10.2)
Chloride: 106 mmol/L (ref 96–106)
Creatinine, Ser: 0.77 mg/dL (ref 0.57–1.00)
GFR calc Af Amer: 111 mL/min/{1.73_m2} (ref >59)
GFR calc non Af Amer: 96 mL/min/{1.73_m2} (ref >59)
Globulin, Total: 2.4 g/dL (ref 1.5–4.5)
Glucose: 108 mg/dL — ABNORMAL HIGH (ref 65–99)
Potassium: 4.4 mmol/L (ref 3.5–5.2)
Sodium: 142 mmol/L (ref 134–144)
Total Protein: 6.9 g/dL (ref 6.0–8.5)

## 2019-05-04 LAB — HEMOGLOBIN A1C
Est. average glucose Bld gHb Est-mCnc: 126 mg/dL
Hgb A1c MFr Bld: 6 % — ABNORMAL HIGH (ref 4.8–5.6)

## 2019-05-04 LAB — LIPID PANEL WITH LDL/HDL RATIO
Cholesterol, Total: 182 mg/dL (ref 100–199)
HDL: 37 mg/dL — ABNORMAL LOW (ref >39)
LDL Chol Calc (NIH): 134 mg/dL — ABNORMAL HIGH (ref 0–99)
LDL/HDL Ratio: 3.6 ratio — ABNORMAL HIGH (ref 0.0–3.2)
Triglycerides: 59 mg/dL (ref 0–149)
VLDL Cholesterol Cal: 11 mg/dL (ref 5–40)

## 2019-05-04 LAB — T3: T3, Total: 132 ng/dL (ref 71–180)

## 2019-05-04 LAB — INSULIN, RANDOM: INSULIN: 18.4 u[IU]/mL (ref 2.6–24.9)

## 2019-05-04 LAB — T4, FREE: Free T4: 1.42 ng/dL (ref 0.82–1.77)

## 2019-05-04 LAB — VITAMIN D 25 HYDROXY (VIT D DEFICIENCY, FRACTURES): Vit D, 25-Hydroxy: 20.8 ng/mL — ABNORMAL LOW (ref 30.0–100.0)

## 2019-05-04 LAB — TSH: TSH: 2.54 u[IU]/mL (ref 0.450–4.500)
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
## 2019-05-15 DIAGNOSIS — S83241A Other tear of medial meniscus, current injury, right knee, initial encounter: Secondary | ICD-10-CM | POA: Diagnosis not present

## 2019-05-17 ENCOUNTER — Encounter (INDEPENDENT_AMBULATORY_CARE_PROVIDER_SITE_OTHER): Payer: Self-pay | Admitting: Bariatrics

## 2019-05-17 ENCOUNTER — Ambulatory Visit (INDEPENDENT_AMBULATORY_CARE_PROVIDER_SITE_OTHER): Payer: 59 | Admitting: Bariatrics

## 2019-05-17 ENCOUNTER — Other Ambulatory Visit: Payer: Self-pay

## 2019-05-17 VITALS — BP 149/95 | HR 66 | Temp 97.8°F | Ht 67.0 in | Wt 257.0 lb

## 2019-05-17 DIAGNOSIS — E559 Vitamin D deficiency, unspecified: Secondary | ICD-10-CM | POA: Diagnosis not present

## 2019-05-17 DIAGNOSIS — Z9189 Other specified personal risk factors, not elsewhere classified: Secondary | ICD-10-CM | POA: Diagnosis not present

## 2019-05-17 DIAGNOSIS — Z6841 Body Mass Index (BMI) 40.0 and over, adult: Secondary | ICD-10-CM | POA: Diagnosis not present

## 2019-05-17 DIAGNOSIS — E786 Lipoprotein deficiency: Secondary | ICD-10-CM | POA: Diagnosis not present

## 2019-05-17 DIAGNOSIS — R7303 Prediabetes: Secondary | ICD-10-CM

## 2019-05-17 MED ORDER — VITAMIN D (ERGOCALCIFEROL) 1.25 MG (50000 UNIT) PO CAPS: 50000.0000 [IU] | ORAL_CAPSULE | ORAL | 0 refills | Status: DC

## 2019-05-17 MED ORDER — METFORMIN HCL 500 MG PO TABS: 500.0000 mg | ORAL_TABLET | Freq: Every day | ORAL | 0 refills | Status: DC

## 2019-05-17 MED FILL — metFORMIN HCL 500 MG TABS: 500 | 30 days supply | Qty: 30 | Fill #0

## 2019-05-17 MED FILL — VIT D2 1.25 MG (50,000 UNIT: 1.25 MG | 28 days supply | Qty: 4 | Fill #0

## 2019-05-18 ENCOUNTER — Telehealth (INDEPENDENT_AMBULATORY_CARE_PROVIDER_SITE_OTHER): Payer: 59 | Admitting: Psychology

## 2019-05-18 DIAGNOSIS — F5089 Other specified eating disorder: Secondary | ICD-10-CM | POA: Diagnosis not present

## 2019-05-19 ENCOUNTER — Encounter: Payer: 59 | Admitting: Registered"

## 2019-05-22 ENCOUNTER — Encounter (INDEPENDENT_AMBULATORY_CARE_PROVIDER_SITE_OTHER): Payer: Self-pay | Admitting: Bariatrics

## 2019-05-22 NOTE — Progress Notes (Unsigned)
Office: [phone]  /  Fax: [phone]    Date: Jun 05, 2019   Appointment Start Time: *** Duration: *** minutes Provider: Lawerance Cruel, Psy.D. Type of Session: Individual Therapy  Location of Patient: {gbptloc:23249} Location of Provider: Provider's Home Type of Contact: Telepsychological Visit via MyChart Video Visit  Session Content: Christine Fields is a 39 y.o. female presenting via MyChart Video Visit for a follow-up appointment to address the previously established treatment goal of increasing coping skills. Today's appointment was a telepsychological visit due to COVID-19. Blakelynn provided verbal consent for today's telepsychological appointment and she is aware she is responsible for securing confidentiality on her end of the session. Prior to proceeding with today's appointment, [name]'s physical location at the time of this appointment was obtained as well a phone number she could be reached at in the event of technical difficulties. Neosha and this provider participated in today's telepsychological service.   This provider conducted a brief check-in and verbally administered the PHQ-9 and GAD-7. *** [name] was receptive to today's appointment as evidenced by openness to sharing, responsiveness to feedback, and {gbreceptiveness:23401}.  Mental Status Examination:  Appearance: {Appearance:22431} Behavior: {Behavior:22445} Mood: {gbmood:21757} Affect: {Affect:22436} Speech: {Speech:22432} Eye Contact: {Eye Contact:22433} Psychomotor Activity: {Motor Activity:22434} Gait: {gbgait:23404} Thought Process: {thought process:22448}  Thought Content/Perception: {disturbances:22451} Orientation: {Orientation:22437} Memory/Concentration: {gbcognition:22449} Insight/Judgment: {Insight:22446}  Structured Assessments Results: The Patient Health Questionnaire-9 (PHQ-9) is a self-report measure that assesses symptoms and severity of depression over the course of the last two weeks. Honest  obtained a score of *** suggesting {GBPHQ9SEVERITY:21752}. [name] finds the endorsed symptoms to be {gbphq9difficulty:21754}. [0= Not at all; 1= Several days; 2= More than half the days; 3= Nearly every day] Little interest or pleasure in doing things ***  Feeling down, depressed, or hopeless ***  Trouble falling or staying asleep, or sleeping too much ***  Feeling tired or having little energy ***  Poor appetite or overeating ***  Feeling bad about yourself --- or that you are a failure or have let yourself or your family down ***  Trouble concentrating on things, such as reading the newspaper or watching television ***  Moving or speaking so slowly that other people could have noticed? Or the opposite --- being so fidgety or restless that you have been moving around a lot more than usual ***  Thoughts that you would be better off dead or hurting yourself in some way ***  PHQ-9 Score ***    The Generalized Anxiety Disorder-7 (GAD-7) is a brief self-report measure that assesses symptoms of anxiety over the course of the last two weeks. Dealva obtained a score of *** suggesting {gbgad7severity:21753}. [name] finds the endorsed symptoms to be {gbphq9difficulty:21754}. [0= Not at all; 1= Several days; 2= Over half the days; 3= Nearly every day] Feeling nervous, anxious, on edge ***  Not being able to stop or control worrying ***  Worrying too much about different things ***  Trouble relaxing ***  Being so restless that it's hard to sit still ***  Becoming easily annoyed or irritable ***  Feeling afraid as if something awful might happen ***  GAD-7 Score ***   Interventions:  {Interventions for Progress Notes:23405}  DSM-5 Diagnosis(es): 307.59 (F50.8) Other Specified Feeding or Eating Disorder, Emotional Eating Behaviors  Treatment Goal & Progress: During the initial appointment with this provider, the following treatment goal was established: increase coping skills. [name] has demonstrated  progress in her goal as evidenced by {gbtxprogress:22839}. [name] also {gbtxprogress2:22951}.  Plan: The next appointment will be  scheduled in {gbweeks:21758}, which will be {gbtxmodality:23402}. The next session will focus on {Plan for Next Appointment:23400}.

## 2019-05-22 NOTE — Progress Notes (Signed)
Chief Complaint:   OBESITY Christine Fields is here to discuss her progress with her obesity treatment plan along with follow-up of her obesity related diagnoses. Christine Fields is on the Category 2 Plan and states she is following her eating plan approximately 95% of the time. Christine Fields states she is walking 60 minutes 3 times per week.  Today's visit was #: 2 Starting weight: 257 lbs Starting date: 05/03/2019 Today's weight: 257 lbs Today's date: 05/17/2019 Total lbs lost to date: 0 Total lbs lost since last in-office visit: 0  Interim History: Christine Fields's weight remains the same.  Subjective:   Vitamin D deficiency. Christine Fields is taking OTC Vitamin D. Last Vitamin D 20.8 on 05/03/2019.  Pre-diabetes. Christine Fields has a diagnosis of prediabetes based on her elevated HgA1c and was informed this puts her at greater risk of developing diabetes. She continues to work on diet and exercise to decrease her risk of diabetes. She denies nausea or hypoglycemia. Christine Fields reports an increased appetite.  Lab Results  Component Value Date   HGBA1C 6.0 (H) 05/03/2019   Lab Results  Component Value Date   INSULIN 18.4 05/03/2019   Low HDL (under 40). HDL was low at 37 on 05/03/2019.  At risk for hypoglycemia. Christine Fields is at increased risk for hypoglycemia due to changes in diet, diagnosis of diabetes, and/or insulin use.   Assessment/Plan:   Vitamin D deficiency. Low Vitamin D level contributes to fatigue and are associated with obesity, breast, and colon cancer. She was given a prescription for Vitamin D, Ergocalciferol, (DRISDOL) 1.25 MG (50000 UNIT) CAPS capsule every week #4 with 0 refills and will follow-up for routine testing of Vitamin D, at least 2-3 times per year to avoid over-replacement.     Pre-diabetes. Christine Fields will continue to work on weight loss, increasing activity,  and decreasing simple carbohydrates to help decrease the risk of diabetes. Handout was given on Insulin Resistance and Prediabetes.  Prescription was given for metFORMIN (GLUCOPHAGE) 500 MG tablet 1 PO with lunch #30 with 0 refills.  Low HDL (under 40). Christine Fields will increase exercise, increase healthy fats and protein, and will decrease carbohydrates.  At risk for hypoglycemia. Christine Fields was given approximately 15 minutes of counseling today regarding prevention of hypoglycemia. She was advised of symptoms of hypoglycemia. Christine Fields was instructed to avoid skipping meals, eat regular protein rich meals and schedule low calorie snacks as needed.   Repetitive spaced learning was employed today to elicit superior memory formation and behavioral change.  Class 3 severe obesity with serious comorbidity and body mass index (BMI) of 40.0 to 44.9 in adult, unspecified obesity type (HCC).  Christine Fields is currently in the action stage of change. As such, her goal is to continue with weight loss efforts. She has agreed to the Category 2 Plan.   She will work on meal planning.  We reviewed with the patient labs from 05/03/2019 including CMP, lipids, CBC, Vitamin D, A1c, insulin, and thyroid panel.  Exercise goals: Christine Fields will continue to walk/elliptical.  Behavioral modification strategies: increasing lean protein intake, decreasing simple carbohydrates, increasing vegetables, increasing water intake, decreasing eating out, no skipping meals, meal planning and cooking strategies, keeping healthy foods in the home and planning for success.  Christine Fields has agreed to follow-up with our clinic in 2 weeks. She was informed of the importance of frequent follow-up visits to maximize her success with intensive lifestyle modifications for her multiple health conditions.   Objective:   Blood pressure (!) 149/95, pulse 66, temperature  97.8 F (36.6 C), height 5\' 7"  (1.702 m), weight 257 lb (116.6 kg), SpO2 99 %. Body mass index is 40.25 kg/m.  General: Cooperative, alert, well developed, in no acute distress. HEENT: Conjunctivae and lids  unremarkable. Cardiovascular: Regular rhythm.  Lungs: Normal work of breathing. Neurologic: No focal deficits.   Lab Results  Component Value Date   CREATININE 0.77 05/03/2019   BUN 14 05/03/2019   NA 142 05/03/2019   K 4.4 05/03/2019   CL 106 05/03/2019   CO2 21 05/03/2019   Lab Results  Component Value Date   ALT 9 05/03/2019   AST 8 05/03/2019   ALKPHOS 64 05/03/2019   BILITOT 0.7 05/03/2019   Lab Results  Component Value Date   HGBA1C 6.0 (H) 05/03/2019   HGBA1C 6.0 (H) 07/06/2018   HGBA1C 5.7 (H) 05/24/2017   Lab Results  Component Value Date   INSULIN 18.4 05/03/2019   Lab Results  Component Value Date   TSH 2.540 05/03/2019   Lab Results  Component Value Date   CHOL 182 05/03/2019   HDL 37 (L) 05/03/2019   LDLCALC 134 (H) 05/03/2019   TRIG 59 05/03/2019   CHOLHDL 4.4 07/06/2018   Lab Results  Component Value Date   WBC 9.1 05/03/2019   HGB 13.8 05/03/2019   HCT 42.3 05/03/2019   MCV 88 05/03/2019   PLT 206 05/03/2019   No results found for: IRON, TIBC, FERRITIN  Attestation Statements:   Reviewed by clinician on day of visit: allergies, medications, problem list, medical history, surgical history, family history, social history, and previous encounter notes.  Migdalia Dk, am acting as Location manager for [employer], DO   I have reviewed the above documentation for accuracy and completeness, and I agree with the above. Jearld Lesch, DO

## 2019-06-05 ENCOUNTER — Telehealth (INDEPENDENT_AMBULATORY_CARE_PROVIDER_SITE_OTHER): Payer: Self-pay | Admitting: Psychology

## 2019-06-07 ENCOUNTER — Ambulatory Visit (INDEPENDENT_AMBULATORY_CARE_PROVIDER_SITE_OTHER): Payer: 59 | Admitting: Bariatrics

## 2019-06-30 ENCOUNTER — Other Ambulatory Visit: Payer: Self-pay | Admitting: Orthopaedic Surgery

## 2019-07-05 MED FILL — HYDROCODON-APAP 5-325: 5-325 | 3 days supply | Qty: 20 | Fill #0

## 2019-07-06 ENCOUNTER — Other Ambulatory Visit: Payer: Self-pay

## 2019-07-06 ENCOUNTER — Encounter (HOSPITAL_BASED_OUTPATIENT_CLINIC_OR_DEPARTMENT_OTHER): Payer: Self-pay | Admitting: Orthopaedic Surgery

## 2019-07-11 ENCOUNTER — Encounter (HOSPITAL_BASED_OUTPATIENT_CLINIC_OR_DEPARTMENT_OTHER)
Admission: RE | Admit: 2019-07-11 | Discharge: 2019-07-11 | Disposition: A | Discharge status: Home / Self Care | Payer: 59 | Source: Ambulatory Visit | Attending: Orthopaedic Surgery | Admitting: Orthopaedic Surgery

## 2019-07-11 ENCOUNTER — Other Ambulatory Visit (HOSPITAL_COMMUNITY)
Admission: RE | Admit: 2019-07-11 | Discharge: 2019-07-11 | Disposition: A | Discharge status: Home / Self Care | Payer: 59 | Source: Ambulatory Visit | Attending: Orthopaedic Surgery | Admitting: Orthopaedic Surgery

## 2019-07-11 DIAGNOSIS — I10 Essential (primary) hypertension: Secondary | ICD-10-CM | POA: Diagnosis not present

## 2019-07-11 DIAGNOSIS — Z01812 Encounter for preprocedural laboratory examination: Secondary | ICD-10-CM | POA: Insufficient documentation

## 2019-07-11 DIAGNOSIS — X58XXXA Exposure to other specified factors, initial encounter: Secondary | ICD-10-CM | POA: Diagnosis not present

## 2019-07-11 DIAGNOSIS — F419 Anxiety disorder, unspecified: Secondary | ICD-10-CM | POA: Diagnosis not present

## 2019-07-11 DIAGNOSIS — Z79899 Other long term (current) drug therapy: Secondary | ICD-10-CM | POA: Diagnosis not present

## 2019-07-11 DIAGNOSIS — M2241 Chondromalacia patellae, right knee: Secondary | ICD-10-CM | POA: Diagnosis not present

## 2019-07-11 DIAGNOSIS — Z20822 Contact with and (suspected) exposure to covid-19: Secondary | ICD-10-CM | POA: Insufficient documentation

## 2019-07-11 DIAGNOSIS — G43909 Migraine, unspecified, not intractable, without status migrainosus: Secondary | ICD-10-CM | POA: Diagnosis not present

## 2019-07-11 DIAGNOSIS — Z6841 Body Mass Index (BMI) 40.0 and over, adult: Secondary | ICD-10-CM | POA: Diagnosis not present

## 2019-07-11 DIAGNOSIS — Z975 Presence of (intrauterine) contraceptive device: Secondary | ICD-10-CM | POA: Diagnosis not present

## 2019-07-11 DIAGNOSIS — S83241A Other tear of medial meniscus, current injury, right knee, initial encounter: Secondary | ICD-10-CM | POA: Diagnosis not present

## 2019-07-11 DIAGNOSIS — Z793 Long term (current) use of hormonal contraceptives: Secondary | ICD-10-CM | POA: Diagnosis not present

## 2019-07-11 LAB — BASIC METABOLIC PANEL
Anion gap: 9 (ref 5–15)
BUN: 10 mg/dL (ref 6–20)
CO2: 23 mmol/L (ref 22–32)
Calcium: 9 mg/dL (ref 8.9–10.3)
Chloride: 107 mmol/L (ref 98–111)
Creatinine, Ser: 0.75 mg/dL (ref 0.44–1.00)
GFR calc Af Amer: >60 mL/min (ref >60)
GFR calc non Af Amer: >60 mL/min (ref >60)
Glucose, Bld: 108 mg/dL — ABNORMAL HIGH (ref 70–99)
Potassium: 4 mmol/L (ref 3.5–5.1)
Sodium: 139 mmol/L (ref 135–145)

## 2019-07-11 LAB — SARS CORONAVIRUS 2 (TAT 6-24 HRS): SARS Coronavirus 2: NEGATIVE

## 2019-07-11 MED FILL — RIZATRIPTAN BENZOATE 10 MG: 10 | 30 days supply | Qty: 10 | Fill #1

## 2019-07-11 NOTE — Progress Notes (Signed)

## 2019-07-13 ENCOUNTER — Other Ambulatory Visit: Payer: Self-pay

## 2019-07-13 ENCOUNTER — Encounter (HOSPITAL_BASED_OUTPATIENT_CLINIC_OR_DEPARTMENT_OTHER)
Admission: RE | Disposition: A | Discharge status: Home / Self Care | Payer: Self-pay | Source: Home / Self Care | Attending: Orthopaedic Surgery

## 2019-07-13 ENCOUNTER — Ambulatory Visit (HOSPITAL_BASED_OUTPATIENT_CLINIC_OR_DEPARTMENT_OTHER): Payer: 59 | Admitting: Anesthesiology

## 2019-07-13 ENCOUNTER — Encounter (HOSPITAL_BASED_OUTPATIENT_CLINIC_OR_DEPARTMENT_OTHER): Payer: Self-pay | Admitting: Orthopaedic Surgery

## 2019-07-13 ENCOUNTER — Ambulatory Visit (HOSPITAL_BASED_OUTPATIENT_CLINIC_OR_DEPARTMENT_OTHER)
Admission: RE | Admit: 2019-07-13 | Discharge: 2019-07-13 | Disposition: A | Discharge status: Home / Self Care | Payer: 59 | Attending: Orthopaedic Surgery | Admitting: Orthopaedic Surgery

## 2019-07-13 DIAGNOSIS — I10 Essential (primary) hypertension: Secondary | ICD-10-CM | POA: Insufficient documentation

## 2019-07-13 DIAGNOSIS — Z793 Long term (current) use of hormonal contraceptives: Secondary | ICD-10-CM | POA: Insufficient documentation

## 2019-07-13 DIAGNOSIS — Z79899 Other long term (current) drug therapy: Secondary | ICD-10-CM | POA: Diagnosis not present

## 2019-07-13 DIAGNOSIS — Z6841 Body Mass Index (BMI) 40.0 and over, adult: Secondary | ICD-10-CM | POA: Insufficient documentation

## 2019-07-13 DIAGNOSIS — M2241 Chondromalacia patellae, right knee: Secondary | ICD-10-CM | POA: Diagnosis not present

## 2019-07-13 DIAGNOSIS — S83241A Other tear of medial meniscus, current injury, right knee, initial encounter: Secondary | ICD-10-CM | POA: Insufficient documentation

## 2019-07-13 DIAGNOSIS — F419 Anxiety disorder, unspecified: Secondary | ICD-10-CM | POA: Insufficient documentation

## 2019-07-13 DIAGNOSIS — M2242 Chondromalacia patellae, left knee: Secondary | ICD-10-CM | POA: Diagnosis not present

## 2019-07-13 DIAGNOSIS — M25561 Pain in right knee: Secondary | ICD-10-CM

## 2019-07-13 DIAGNOSIS — Z975 Presence of (intrauterine) contraceptive device: Secondary | ICD-10-CM | POA: Insufficient documentation

## 2019-07-13 DIAGNOSIS — X58XXXA Exposure to other specified factors, initial encounter: Secondary | ICD-10-CM | POA: Insufficient documentation

## 2019-07-13 DIAGNOSIS — G43909 Migraine, unspecified, not intractable, without status migrainosus: Secondary | ICD-10-CM | POA: Insufficient documentation

## 2019-07-13 DIAGNOSIS — M23321 Other meniscus derangements, posterior horn of medial meniscus, right knee: Secondary | ICD-10-CM | POA: Diagnosis not present

## 2019-07-13 HISTORY — PX: KNEE ARTHROSCOPY: SHX127

## 2019-07-13 LAB — POCT PREGNANCY, URINE: Preg Test, Ur: NEGATIVE

## 2019-07-13 SURGERY — ARTHROSCOPY, KNEE
Anesthesia: General | Site: Knee | Laterality: Right

## 2019-07-13 MED ORDER — MEPERIDINE HCL 25 MG/ML IJ SOLN: 6.2500 mg | INTRAMUSCULAR | Status: DC | PRN

## 2019-07-13 MED ORDER — LACTATED RINGERS IV SOLN: INTRAVENOUS | Status: DC

## 2019-07-13 MED ORDER — HYDROMORPHONE HCL 1 MG/ML IJ SOLN: 0.2500 mg | INTRAMUSCULAR | Status: DC | PRN

## 2019-07-13 MED ORDER — BUPIVACAINE HCL (PF) 0.25 % IJ SOLN
INTRAMUSCULAR | Status: AC
  Filled 2019-07-13: qty 30

## 2019-07-13 MED ORDER — FENTANYL CITRATE (PF) 100 MCG/2ML IJ SOLN
INTRAMUSCULAR | Status: DC | PRN
  Administered 2019-07-13: 100 ug via INTRAVENOUS
  Administered 2019-07-13: 50 ug via INTRAVENOUS

## 2019-07-13 MED ORDER — MIDAZOLAM HCL 5 MG/5ML IJ SOLN
INTRAMUSCULAR | Status: DC | PRN
  Administered 2019-07-13: 2 mg via INTRAVENOUS

## 2019-07-13 MED ORDER — MORPHINE SULFATE (PF) 4 MG/ML IV SOLN
INTRAVENOUS | Status: AC
  Filled 2019-07-13: qty 1

## 2019-07-13 MED ORDER — PROPOFOL 500 MG/50ML IV EMUL
INTRAVENOUS | Status: AC
  Filled 2019-07-13: qty 50

## 2019-07-13 MED ORDER — PROPOFOL 10 MG/ML IV BOLUS
INTRAVENOUS | Status: DC | PRN
  Administered 2019-07-13: 200 mg via INTRAVENOUS

## 2019-07-13 MED ORDER — FENTANYL CITRATE (PF) 100 MCG/2ML IJ SOLN
INTRAMUSCULAR | Status: AC
  Filled 2019-07-13: qty 2

## 2019-07-13 MED ORDER — DEXAMETHASONE SODIUM PHOSPHATE 10 MG/ML IJ SOLN
INTRAMUSCULAR | Status: AC
  Filled 2019-07-13: qty 1

## 2019-07-13 MED ORDER — MORPHINE SULFATE (PF) 4 MG/ML IV SOLN
INTRAVENOUS | Status: DC | PRN
  Administered 2019-07-13: 4 mg via SUBCUTANEOUS

## 2019-07-13 MED ORDER — AMISULPRIDE (ANTIEMETIC) 5 MG/2ML IV SOLN: 10.0000 mg | Freq: Once | INTRAVENOUS | Status: DC | PRN

## 2019-07-13 MED ORDER — BUPIVACAINE HCL (PF) 0.5 % IJ SOLN
INTRAMUSCULAR | Status: AC
  Filled 2019-07-13: qty 30

## 2019-07-13 MED ORDER — CEFAZOLIN SODIUM-DEXTROSE 2-4 GM/100ML-% IV SOLN
2.0000 g | INTRAVENOUS | Status: AC
  Administered 2019-07-13: 2 g via INTRAVENOUS

## 2019-07-13 MED ORDER — LIDOCAINE 2% (20 MG/ML) 5 ML SYRINGE
INTRAMUSCULAR | Status: AC
  Filled 2019-07-13: qty 5

## 2019-07-13 MED ORDER — PROMETHAZINE HCL 25 MG/ML IJ SOLN: 6.2500 mg | INTRAMUSCULAR | Status: DC | PRN

## 2019-07-13 MED ORDER — CEFAZOLIN SODIUM-DEXTROSE 2-4 GM/100ML-% IV SOLN
INTRAVENOUS | Status: AC
  Filled 2019-07-13: qty 100

## 2019-07-13 MED ORDER — DEXAMETHASONE SODIUM PHOSPHATE 4 MG/ML IJ SOLN
INTRAMUSCULAR | Status: DC | PRN
  Administered 2019-07-13: 5 mg via INTRAVENOUS

## 2019-07-13 MED ORDER — ONDANSETRON HCL 4 MG/2ML IJ SOLN
INTRAMUSCULAR | Status: DC | PRN
  Administered 2019-07-13: 4 mg via INTRAVENOUS

## 2019-07-13 MED ORDER — METHYLPREDNISOLONE ACETATE 80 MG/ML IJ SUSP
INTRAMUSCULAR | Status: AC
  Filled 2019-07-13: qty 1

## 2019-07-13 MED ORDER — LIDOCAINE-EPINEPHRINE 1 %-1:100000 IJ SOLN
INTRAMUSCULAR | Status: DC | PRN
  Administered 2019-07-13: 20 mL

## 2019-07-13 MED ORDER — ONDANSETRON HCL 4 MG/2ML IJ SOLN
INTRAMUSCULAR | Status: AC
  Filled 2019-07-13: qty 2

## 2019-07-13 MED ORDER — MIDAZOLAM HCL 2 MG/2ML IJ SOLN
INTRAMUSCULAR | Status: AC
  Filled 2019-07-13: qty 2

## 2019-07-13 MED ORDER — OXYCODONE HCL 5 MG PO TABS: 5.0000 mg | ORAL_TABLET | Freq: Once | ORAL | Status: DC | PRN

## 2019-07-13 MED ORDER — OXYCODONE HCL 5 MG/5ML PO SOLN: 5.0000 mg | Freq: Once | ORAL | Status: DC | PRN

## 2019-07-13 MED ORDER — LIDOCAINE HCL (CARDIAC) PF 100 MG/5ML IV SOSY
PREFILLED_SYRINGE | INTRAVENOUS | Status: DC | PRN
  Administered 2019-07-13: 60 mg via INTRAVENOUS

## 2019-07-13 MED ORDER — METHYLPREDNISOLONE ACETATE 40 MG/ML IJ SUSP
INTRAMUSCULAR | Status: AC
  Filled 2019-07-13: qty 1

## 2019-07-13 SURGICAL SUPPLY — 39 items
BLADE EXCALIBUR 4.0MM X 13CM (MISCELLANEOUS)
BLADE EXCALIBUR 4.0X13 (MISCELLANEOUS) IMPLANT
BNDG COHESIVE 6X5 TAN STRL LF (GAUZE/BANDAGES/DRESSINGS) ×3 IMPLANT
BNDG ELASTIC 6X5.8 VLCR STR LF (GAUZE/BANDAGES/DRESSINGS) ×3 IMPLANT
BNDG GAUZE ELAST 4 BULKY (GAUZE/BANDAGES/DRESSINGS) ×3 IMPLANT
COVER WAND RF STERILE (DRAPES) IMPLANT
DISSECTOR  3.8MM X 13CM (MISCELLANEOUS) ×3
DISSECTOR 3.8MM X 13CM (MISCELLANEOUS) ×1 IMPLANT
DRAPE ARTHROSCOPY W/POUCH 90 (DRAPES) ×3 IMPLANT
DRAPE U-SHAPE 47X51 STRL (DRAPES) ×3 IMPLANT
DRSG EMULSION OIL 3X3 NADH (GAUZE/BANDAGES/DRESSINGS) ×3 IMPLANT
DURAPREP 26ML APPLICATOR (WOUND CARE) ×6 IMPLANT
ELECT MENISCUS 165MM 90D (ELECTRODE) IMPLANT
ELECT REM PT RETURN 9FT ADLT (ELECTROSURGICAL)
ELECTRODE REM PT RTRN 9FT ADLT (ELECTROSURGICAL) IMPLANT
GAUZE SPONGE 4X4 12PLY STRL (GAUZE/BANDAGES/DRESSINGS) ×3 IMPLANT
GLOVE BIO SURGEON STRL SZ 6.5 (GLOVE) ×2 IMPLANT
GLOVE BIO SURGEON STRL SZ8 (GLOVE) ×6 IMPLANT
GLOVE BIO SURGEONS STRL SZ 6.5 (GLOVE) ×1
GLOVE BIOGEL PI IND STRL 6.5 (GLOVE) ×1 IMPLANT
GLOVE BIOGEL PI IND STRL 8 (GLOVE) ×2 IMPLANT
GLOVE BIOGEL PI INDICATOR 6.5 (GLOVE) ×2
GLOVE BIOGEL PI INDICATOR 8 (GLOVE) ×4
GOWN STRL REUS W/ TWL LRG LVL3 (GOWN DISPOSABLE) ×1 IMPLANT
GOWN STRL REUS W/ TWL XL LVL3 (GOWN DISPOSABLE) ×2 IMPLANT
GOWN STRL REUS W/TWL LRG LVL3 (GOWN DISPOSABLE) ×3
GOWN STRL REUS W/TWL XL LVL3 (GOWN DISPOSABLE) ×6
IV NS IRRIG 3000ML ARTHROMATIC (IV SOLUTION) IMPLANT
KNEE WRAP E Z 3 GEL PACK (MISCELLANEOUS) ×3 IMPLANT
MANIFOLD NEPTUNE II (INSTRUMENTS) IMPLANT
PACK BASIN DAY SURGERY FS (CUSTOM PROCEDURE TRAY) ×3 IMPLANT
PACK DSU ARTHROSCOPY (CUSTOM PROCEDURE TRAY) ×3 IMPLANT
PACK ICE MAXI GEL EZY WRAP (MISCELLANEOUS) ×3 IMPLANT
PENCIL SMOKE EVACUATOR (MISCELLANEOUS) IMPLANT
SHEET MEDIUM DRAPE 40X70 STRL (DRAPES) ×3 IMPLANT
SYR 3ML 18GX1 1/2 (SYRINGE) IMPLANT
TOWEL GREEN STERILE FF (TOWEL DISPOSABLE) ×3 IMPLANT
TUBING ARTHROSCOPY IRRIG 16FT (MISCELLANEOUS) ×3 IMPLANT
WATER STERILE IRR 1000ML POUR (IV SOLUTION) ×3 IMPLANT

## 2019-07-13 NOTE — Discharge Instructions (Signed)
Arthroscopic Knee Surgery Post -OP Instructions  You have just had an arthroscopic operation. Your incisions (puncture sites) are small and should heal quickly. The structures inside your knee may take 6-8 weeks to heal and settle down. This healing time is variable and may range from a few days to 6-8 weeks.  Pain Medication: You will be given a prescription for pain medication. Please take the medication as needed. Most patients require pain medication for only a few days.  Swelling: You can expect some swelling in your knee. Applying ice and elevating your leg will help keep the swelling to a minimum. Ice can be applied by placing ice cubes in a plastic bag and putting the bag on your knee with a towel between the ice bag and your knee. Sometimes you will be issued an ice pack wrap and that will work as well. Your entire leg should be elevated, not just your knee. Elevate your leg to or above the level of your heart. The ice should be used periodically during the first 48 hours along with continued elevation of your leg. The swelling should subside over the next several weeks.  Dressing: Fluid leakage is common the first night. Precautions to prevent staining of your clothes, bed sheets, etc., should be taken. This fluid was used to inflate the joint during surgery and is commonly tinged red from a small amount of blood. Remove your dressing tomorrow. Some bleeding or leakage from the puncture sites may occur for a few days and you should cover the puncture sites with Band-Aids until the leakage stops.Alternatively you may reapply the ace wrap with some gauze pads over the puncture sites but don't wrap it too tightly.  You may shower 2 days after surgery.   Activity: You may bend and straighten your knee as soon as it is comfortable to do so. Using your knee will help decrease swelling and help prevent stiffness, as long as you don't overdo it. Moving your foot up and down also helps decrease the  swelling. There is no harm in putting weight on; your leg as long as it is comfortable to do so. (You should not, try to run or jump). Gradually increase activity as you can tolerate. An increase in pain or swelling with certain activities or with increased activities may indicate you are doing too much. Back up and start building up your activities at a slower rate. You may need crutches for a few days.  Office Check-Up: If no major problems arise and you are progressing well, we will need to see you in the office in one (1) week unless otherwise instructed by your doctor. Please call the office to make an appointment.     Post Anesthesia Home Care Instructions  Activity: Get plenty of rest for the remainder of the day. A responsible individual must stay with you for 24 hours following the procedure.  For the next 24 hours, DO NOT: -Drive a car -Advertising copywriter -Drink alcoholic beverages -Take any medication unless instructed by your physician -Make any legal decisions or sign important papers.  Meals: Start with liquid foods such as gelatin or soup. Progress to regular foods as tolerated. Avoid greasy, spicy, heavy foods. If nausea and/or vomiting occur, drink only clear liquids until the nausea and/or vomiting subsides. Call your physician if vomiting continues.  Special Instructions/Symptoms: Your throat may feel dry or sore from the anesthesia or the breathing tube placed in your throat during surgery. If this causes discomfort, gargle with  warm salt water. The discomfort should disappear within 24 hours.  If you had a scopolamine patch placed behind your ear for the management of post- operative nausea and/or vomiting:  1. The medication in the patch is effective for 72 hours, after which it should be removed.  Wrap patch in a tissue and discard in the trash. Wash hands thoroughly with soap and water. 2. You may remove the patch earlier than 72 hours if you experience unpleasant  side effects which may include dry mouth, dizziness or visual disturbances. 3. Avoid touching the patch. Wash your hands with soap and water after contact with the patch.

## 2019-07-13 NOTE — Anesthesia Procedure Notes (Signed)
Procedure Name: LMA Insertion Performed by: Jacquez Sheetz, [place], CRNA Pre-anesthesia Checklist: Patient identified, Emergency Drugs available, Suction available and Patient being monitored Patient Re-evaluated:Patient Re-evaluated prior to induction Oxygen Delivery Method: Circle system utilized Preoxygenation: Pre-oxygenation with 100% oxygen Induction Type: IV induction Ventilation: Mask ventilation without difficulty LMA: LMA inserted LMA Size: 4.0 Number of attempts: 1 Airway Equipment and Method: Bite block Placement Confirmation: positive ETCO2 Tube secured with: Tape Dental Injury: Teeth and Oropharynx as per pre-operative assessment        

## 2019-07-13 NOTE — Transfer of Care (Signed)
Immediate Anesthesia Transfer of Care Note  Patient: Christine Fields  Procedure(s) Performed: RIGHT KNEE ARTHROSCOPY PARTIAL MEDIAL MENISCECTOMY MEDIAL CHONDROPLASTY (Right Knee)  Patient Location: PACU  Anesthesia Type:General  Level of Consciousness: awake and alert   Airway & Oxygen Therapy: Patient Spontanous Breathing and Patient connected to nasal cannula oxygen  Post-op Assessment: Report given to RN and Post -op Vital signs reviewed and stable  Post vital signs: Reviewed and stable  Last Vitals:  Vitals Value Taken Time  BP 143/99 07/13/19 0809  Temp    Pulse 78 07/13/19 0810  Resp 18 07/13/19 0810  SpO2 97 % 07/13/19 0810  Vitals shown include unvalidated device data.  Last Pain:  Vitals:   07/13/19 0648  TempSrc: Oral  PainSc: 0-No pain      Patients Stated Pain Goal: 4 (07/13/19 6720)  Complications: No complications documented.

## 2019-07-13 NOTE — Op Note (Signed)
NAME: Christine Fields, Christine Fields MEDICAL RECORD MG:[id number] ACCOUNT [id number] DATE OF BIRTH:December 08, 1980 FACILITY: [facility] LOCATION: [facility] PHYSICIAN:Tamyka Bezio Janann Colonel, MD  OPERATIVE REPORT  DATE OF PROCEDURE:  07/13/2019  PREOPERATIVE DIAGNOSES: 1.  Right knee torn medial meniscus. 2.  Right knee chondromalacia patella.  POSTOPERATIVE DIAGNOSES:   1.  Right knee torn medial meniscus. 2.  Right knee chondromalacia patella.  PROCEDURE: 1.  Right knee partial medial meniscectomy. 2.  Right knee abrasion chondroplasty, patellofemoral.  ANESTHESIA:  General.  ATTENDING SURGEON:  Breeley Bischof  INDICATIONS:  The patient is a 39 year old woman with a many-month history of right knee pain.  This has persisted despite conservative measures including anti-inflammatories, injection, and therapy.  By MRI scan, she has a torn medial meniscus and a  paucity of degenerative change.  She is offered an arthroscopy.  Informed operative consent was obtained after discussion of possible complications including reaction to anesthesia and infection.  SUMMARY OF FINDINGS AND PROCEDURE:  Under general anesthesia, a right knee arthroscopy was performed.  Suprapatellar pouch was benign while the patellofemoral joint exhibited some grade III and focal grade IV change on the undersurface of the patella.  A  chondroplasty was done along with abrasion of bleeding bone in 1 small area.  The patella tracked well.  In the medial compartment, she had a radial tear of the posterior horn of the medial meniscus.  This did have a flipped portion.  I performed about  a 10% partial medial meniscectomy with basket and shaver back to stable tissues.  She had no degenerative changes here.  ACL looked normal and the lateral compartment was completely benign.  She was scheduled to go home the same day.  DESCRIPTION OF PROCEDURE:  The patient was taken to the operating suite where general anesthetic was applied without difficulty.  She  was positioned supine and prepped and draped in normal sterile fashion.  After the administration of preoperative IV  Kefzol and appropriate time-out, an arthroscopy of the right knee was performed through a total of 2 portals.  Findings were as noted above and procedure consisted predominantly of the partial medial meniscectomy, which was done with a basket and shaver  back to stable tissues, taking about 10% of the structure in the process.  I also performed a chondroplasty and abrasion as described above.  The knee was thoroughly irrigated at end of the case, followed by placement of Marcaine with epinephrine and  morphine.  Adaptic was placed over the portals followed by dry gauze and a loose Coban wrap.  ESTIMATED BLOOD LOSS:  Obtained from anesthesia records.  INTRAOPERATIVE FLUIDS:  Obtained from anesthesia records.  DISPOSITION:  The patient was extubated in the operating room and taken to recovery room in stable condition.  Plans were for her to go home same day and follow up in the office in less than a week.  I will contact her by phone tonight.  CN/NUANCE  D:07/13/2019 T:07/13/2019 JOB:[id number]

## 2019-07-13 NOTE — Interval H&P Note (Signed)
History and Physical Interval Note:  07/13/2019 6:33 AM  Christine Fields  has presented today for surgery, with the diagnosis of RIGHT KNEE MEDIAL MENISCUS TEAR AND DEGENERATIVE JOINT DISEASE.  The various methods of treatment have been discussed with the patient and family. After consideration of risks, benefits and other options for treatment, the patient has consented to  Procedure(s): RIGHT KNEE ARTHROSCOPY (Right) as a surgical intervention.  The patient's history has been reviewed, patient examined, no change in status, stable for surgery.  I have reviewed the patient's chart and labs.  Questions were answered to the patient's satisfaction.     [name]

## 2019-07-13 NOTE — Anesthesia Postprocedure Evaluation (Signed)
Anesthesia Post Note  Patient: Christine Fields  Procedure(s) Performed: RIGHT KNEE ARTHROSCOPY PARTIAL MEDIAL MENISCECTOMY MEDIAL CHONDROPLASTY (Right Knee)     Patient location during evaluation: PACU Anesthesia Type: General Level of consciousness: awake and alert Pain management: pain level controlled Vital Signs Assessment: post-procedure vital signs reviewed and stable Respiratory status: spontaneous breathing, nonlabored ventilation and respiratory function stable Cardiovascular status: blood pressure returned to baseline and stable Postop Assessment: no apparent nausea or vomiting Anesthetic complications: no   No complications documented.  Last Vitals:  Vitals:   07/13/19 0900 07/13/19 0915  BP: (!) 167/111 (!) 166/111  Pulse:  62  Resp:  18  Temp:  36.7 C  SpO2:  100%    Last Pain:  Vitals:   07/13/19 0915  TempSrc:   PainSc: 0-No pain                 Lowella Curb

## 2019-07-13 NOTE — H&P (Signed)
Subjective: Christine Fields persists with right knee pain.  The injection we gave her a month back helped for a while.  She has to limit what she can do.  We know from an MRI scan that she has a torn medial meniscus and some moderate degenerative change.  She has a trip to [place] coming up in June.  Her past medical, family, and social histories are reviewed and are unchanged.  She has no medication allergies.  Her medication list includes things for hypertension and migraines.  She does not smoke.  A fourteen system review was performed and was negative in relation to the orthopedic problem.  Objective: Right knee persists with 1+ effusion.  Her motion is about 0-115.  She has medial joint line pain and some mild crepitation.  McMurray's test is positive for pain and a pop in the medial direction.  Ligaments are stable.  Hip motion is full and straight leg raise is negative.  She has normal unlabored respirations.  In addition the patient is well developed, well nourished, and in no apparent distress.  Also alert and oriented times three.  There are normal extraocular motions and pupils are equal and reactive.  Assessment:   Right knee torn medial meniscus by MRI 2021 injected 04/21/19  Plan: I think Christine Fields should consider a knee arthroscopy at this point.  She has a torn meniscus and some mild to moderate degenerative change but is only 39 and is having to limit herself significantly.  I think we can make her better but not new with a knee arthroscopy. I reviewed risks of anesthesia and infection as well as potential for DVT related to a knee arthroscopy.  I've stressed the importance of some postoperative physical therapy to optimize results and we will try to set up an appointment.  Two to four  weeks for recovery would be typical but that is a little variable.  We will try to set up this surgery at some point in the coming months after her trip to [place].

## 2019-07-13 NOTE — Anesthesia Preprocedure Evaluation (Signed)
Anesthesia Evaluation  Patient identified by MRN, date of birth, ID band Patient awake    Reviewed: Allergy & Precautions, NPO status , Patient's Chart, lab work & pertinent test results  Airway Mallampati: II  TM Distance: >3 FB Neck ROM: Full    Dental no notable dental hx.    Pulmonary neg pulmonary ROS,    Pulmonary exam normal breath sounds clear to auscultation       Cardiovascular hypertension, Pt. on medications negative cardio ROS Normal cardiovascular exam Rhythm:Regular Rate:Normal     Neuro/Psych  Headaches, Anxiety negative psych ROS   GI/Hepatic negative GI ROS, Neg liver ROS,   Endo/Other  Morbid obesity  Renal/GU negative Renal ROS  negative genitourinary   Musculoskeletal negative musculoskeletal ROS (+)   Abdominal (+) + obese,   Peds negative pediatric ROS (+)  Hematology negative hematology ROS (+)   Anesthesia Other Findings   Reproductive/Obstetrics negative OB ROS                             Anesthesia Physical Anesthesia Plan  ASA: III  Anesthesia Plan: General   Post-op Pain Management:    Induction: Intravenous  PONV Risk Score and Plan: 3 and Ondansetron, Dexamethasone, Midazolam and Treatment may vary due to age or medical condition  Airway Management Planned: LMA  Additional Equipment:   Intra-op Plan:   Post-operative Plan: Extubation in OR  Informed Consent: I have reviewed the patients History and Physical, chart, labs and discussed the procedure including the risks, benefits and alternatives for the proposed anesthesia with the patient or authorized representative who has indicated his/her understanding and acceptance.     Dental advisory given  Plan Discussed with: CRNA  Anesthesia Plan Comments:         Anesthesia Quick Evaluation

## 2019-07-13 NOTE — Op Note (Signed)
Christine Fields [id number] 07/13/2019   PRE-OP DIAGNOSIS: TMM and CP right knee  POST-OP DIAGNOSIS: same  PROCEDURE: right knee scope with PMM and CP  ANESTHESIA: general  [name]   Dictation #:  [id number]

## 2019-07-14 ENCOUNTER — Encounter (HOSPITAL_BASED_OUTPATIENT_CLINIC_OR_DEPARTMENT_OTHER): Payer: Self-pay | Admitting: Orthopaedic Surgery

## 2019-07-21 DIAGNOSIS — Z9889 Other specified postprocedural states: Secondary | ICD-10-CM | POA: Diagnosis not present

## 2019-07-28 DIAGNOSIS — M25661 Stiffness of right knee, not elsewhere classified: Secondary | ICD-10-CM | POA: Diagnosis not present

## 2019-07-28 DIAGNOSIS — M6281 Muscle weakness (generalized): Secondary | ICD-10-CM | POA: Diagnosis not present

## 2019-08-10 MED FILL — BISOPROLOL-HCTZ 10-6.25 MG: 10-6.25 | 30 days supply | Qty: 30 | Fill #2

## 2019-08-11 DIAGNOSIS — M25661 Stiffness of right knee, not elsewhere classified: Secondary | ICD-10-CM | POA: Diagnosis not present

## 2019-08-17 DIAGNOSIS — Z20822 Contact with and (suspected) exposure to covid-19: Secondary | ICD-10-CM | POA: Diagnosis not present

## 2019-08-25 DIAGNOSIS — M25661 Stiffness of right knee, not elsewhere classified: Secondary | ICD-10-CM | POA: Diagnosis not present

## 2019-08-25 DIAGNOSIS — M6281 Muscle weakness (generalized): Secondary | ICD-10-CM | POA: Diagnosis not present

## 2019-09-01 DIAGNOSIS — M6281 Muscle weakness (generalized): Secondary | ICD-10-CM | POA: Diagnosis not present

## 2019-09-01 DIAGNOSIS — M25661 Stiffness of right knee, not elsewhere classified: Secondary | ICD-10-CM | POA: Diagnosis not present

## 2019-09-21 ENCOUNTER — Other Ambulatory Visit: Payer: Self-pay

## 2019-09-21 ENCOUNTER — Other Ambulatory Visit: Payer: Self-pay | Admitting: Medical

## 2019-09-21 MED FILL — BISOPROLOL-HCTZ 10-6.25 MG: 10-6.25 | 30 days supply | Qty: 30 | Fill #0

## 2019-10-04 DIAGNOSIS — N898 Other specified noninflammatory disorders of vagina: Secondary | ICD-10-CM | POA: Diagnosis not present

## 2019-10-04 MED FILL — RIZATRIPTAN BENZOATE 10 MG: 10 | 30 days supply | Qty: 10 | Fill #2

## 2019-10-04 MED FILL — FLUARIX QUADRIVALENT 0.5 ML: 0.5 | 1 days supply | Qty: 1 | Fill #0

## 2019-10-13 DIAGNOSIS — Z1231 Encounter for screening mammogram for malignant neoplasm of breast: Secondary | ICD-10-CM | POA: Diagnosis not present

## 2019-10-13 LAB — HM MAMMOGRAPHY

## 2019-11-10 ENCOUNTER — Encounter: Payer: Self-pay | Admitting: Medical

## 2019-11-10 ENCOUNTER — Other Ambulatory Visit: Payer: Self-pay

## 2019-11-10 ENCOUNTER — Ambulatory Visit (INDEPENDENT_AMBULATORY_CARE_PROVIDER_SITE_OTHER): Payer: 59 | Admitting: Medical

## 2019-11-10 VITALS — BP 124/94 | HR 58 | Ht 66.0 in | Wt 265.8 lb

## 2019-11-10 DIAGNOSIS — Z0000 Encounter for general adult medical examination without abnormal findings: Secondary | ICD-10-CM | POA: Diagnosis not present

## 2019-11-10 DIAGNOSIS — E559 Vitamin D deficiency, unspecified: Secondary | ICD-10-CM | POA: Diagnosis not present

## 2019-11-10 DIAGNOSIS — Z975 Presence of (intrauterine) contraceptive device: Secondary | ICD-10-CM | POA: Diagnosis not present

## 2019-11-10 DIAGNOSIS — R7301 Impaired fasting glucose: Secondary | ICD-10-CM | POA: Diagnosis not present

## 2019-11-10 DIAGNOSIS — E669 Obesity, unspecified: Secondary | ICD-10-CM

## 2019-11-10 DIAGNOSIS — E786 Lipoprotein deficiency: Secondary | ICD-10-CM | POA: Diagnosis not present

## 2019-11-10 DIAGNOSIS — I10 Essential (primary) hypertension: Secondary | ICD-10-CM

## 2019-11-10 LAB — LDL CHOLESTEROL, DIRECT

## 2019-11-10 NOTE — Progress Notes (Signed)
Subjective:   HPI  Christine Fields is a 39 y.o. female who presents for Chief Complaint  Patient presents with  . Annual Exam    not fasting     Patient Care Team: Evalena Fujii, Kermit Balo, PA-C as PCP - General (Family Medicine) Sees dentist:[facility]) Sees eye doctor:Dr.Shapiro Dr. Marcene Corning, [facility] Dr. Corinna Capra, weight management clinic [facility]   Concerns: patient has no concerns today   1pregnancies, 1 live birth. Not having periods with IUD  patient sees gynecology for gynecology care.   Reviewed their medical, surgical, family, social, medication, and allergy history and updated chart as appropriate.  Past Medical History:  Diagnosis Date  . Alkaline phosphatase elevation 2021  . Hypertension   . Migraine   . Pre-diabetes   . Vitamin D deficiency     Family History  Problem Relation Age of Onset  . Diabetes Father   . Hypertension Father   . Stroke Father   . Hypertension Maternal Grandmother   . Diabetes Maternal Grandfather   . Cancer Maternal Grandfather      Current Outpatient Medications:  .  bisoprolol-hydrochlorothiazide (ZIAC) 10-6.25 MG tablet, TAKE 1 TABLET BY MOUTH DAILY., Disp: 30 tablet, Rfl: 1 .  cholecalciferol (VITAMIN D3) 25 MCG (1000 UT) tablet, Take 1 tablet (1,000 Units total) by mouth daily., Disp: 90 tablet, Rfl: 3 .  levonorgestrel (MIRENA, 52 MG,) 20 MCG/24HR IUD, , Disp: , Rfl:  .  rizatriptan (MAXALT) 10 MG tablet, Take 1 tablet (10 mg total) by mouth as needed for migraine. May repeat in 2 hours if needed, Disp: 10 tablet, Rfl: 2  Allergies  Allergen Reactions  . Amlodipine     Headache   . Kiwi Extract     Tongue and mouth burning/irritation     Review of Systems Constitutional: -fever, -chills, -sweats, -unexpected weight change, -decreased appetite, -fatigue Allergy: -sneezing, -itching, -congestion Dermatology: -changing moles, --rash, -lumps ENT: -runny nose, -ear pain, -sore  throat, -hoarseness, -sinus pain, -teeth pain, - ringing in ears, -hearing loss, -nosebleeds Cardiology: -chest pain, -palpitations, -swelling, -difficulty breathing when lying flat, -waking up short of breath Respiratory: -cough, -shortness of breath, -difficulty breathing with exercise or exertion, -wheezing, -coughing up blood Gastroenterology: -abdominal pain, -nausea, -vomiting, -diarrhea, -constipation, -blood in stool, -changes in bowel movement, -difficulty swallowing or eating Hematology: -bleeding, -bruising  Musculoskeletal: -joint aches, -muscle aches, -joint swelling, -back pain, -neck pain, -cramping, -changes in gait Ophthalmology: denies vision changes, eye redness, itching, discharge Urology: -burning with urination, -difficulty urinating, -blood in urine, -urinary frequency, -urgency, -incontinence Neurology: -headache, -weakness, -tingling, -numbness, -memory loss, -falls, -dizziness Psychology: -depressed mood, -agitation, -sleep problems Breast/gyn: -breast tendnerss, -discharge, -lumps, -vaginal discharge,- irregular periods, -heavy periods     Objective:  BP (!) 124/94   Pulse (!) 58   Ht 5\' 6"  (1.676 m)   Wt 265 lb 12.8 oz (120.6 kg)   SpO2 98%   BMI 42.90 kg/m  BP Readings from Last 3 Encounters:  11/10/19 (!) 124/94  07/13/19 (!) 166/111  05/17/19 (!) 149/95   Wt Readings from Last 3 Encounters:  11/10/19 265 lb 12.8 oz (120.6 kg)  07/13/19 259 lb 7.7 oz (117.7 kg)  05/17/19 257 lb (116.6 kg)    General appearance: alert, no distress, WD/WN, African American female Skin: unremarkable HEENT: normocephalic, conjunctiva/corneas normal, sclerae anicteric, PERRLA, EOMi, nares patent, no discharge or erythema, pharynx normal Oral cavity: MMM, tongue normal, teeth normal Neck: supple, no lymphadenopathy, no thyromegaly, no masses,  normal ROM, no bruits Chest: non tender, normal shape and expansion Heart: RRR, normal S1, S2, no murmurs Lungs: CTA bilaterally,  no wheezes, rhonchi, or rales Abdomen: +bs, soft, non tender, non distended, no masses, no hepatomegaly, no splenomegaly, no bruits Back: non tender, normal ROM, no scoliosis Musculoskeletal: upper extremities non tender, no obvious deformity, normal ROM throughout, lower extremities non tender, no obvious deformity, normal ROM throughout Extremities: no edema, no cyanosis, no clubbing Pulses: 2+ symmetric, upper and lower extremities, normal cap refill Neurological: alert, oriented x 3, CN2-12 intact, strength normal upper extremities and lower extremities, sensation normal throughout, DTRs 2+ throughout, no cerebellar signs, gait normal Psychiatric: normal affect, behavior normal, pleasant  Breast/gyn/rectal - deferred to gynecology   Assessment and Plan :   Encounter Diagnoses  Name Primary?  . Encounter for health maintenance examination in adult Yes  . IUD (intrauterine device) in place   . Impaired fasting blood sugar   . Essential hypertension, benign   . Low HDL (under 40)   . Obesity with serious comorbidity, unspecified classification, unspecified obesity type   . Vitamin D deficiency     Physical exam - discussed and counseled on healthy lifestyle, diet, exercise, preventative care, vaccinations, sick and well care, proper use of emergency dept and after hours care, and addressed their concerns.    Health screening: Advised they see their eye doctor yearly for routine vision care. Advised they see their dentist yearly for routine dental care including hygiene visits twice yearly. See your gynecologist yearly for routine gynecological care.  Cancer screening Counseled on self breast exams, mammograms, cervical cancer screening  Colonoscopy:  Advised age 35yo  We will request prior pap and mammo from gynecology and [facility]    Vaccinations: Advised yearly influenza vaccine Up to date on flu, td and covid vaccines   Separate significant issues  discussed: Hypertension-continue current medication.  She declines any medication additions today.  She wants to work on lifestyle changes first and reassess in 6 months.  Her home readings are in the 130 over 80s per her report  Vitamin D deficiency-labs today, continue supplement  Continue efforts to lose weight through healthy diet measures and exercise  Impaired glucose-labs today  Kanija was seen today for annual exam.  Diagnoses and all orders for this visit:  Encounter for health maintenance examination in adult -     Comprehensive metabolic panel -     VITAMIN D 25 Hydroxy (Vit-D Deficiency, Fractures) -     LDL Cholesterol, Direct -     Hemoglobin A1c  IUD (intrauterine device) in place  Impaired fasting blood sugar -     Hemoglobin A1c  Essential hypertension, benign -     Comprehensive metabolic panel -     LDL Cholesterol, Direct  Low HDL (under 40) -     LDL Cholesterol, Direct  Obesity with serious comorbidity, unspecified classification, unspecified obesity type  Vitamin D deficiency -     VITAMIN D 25 Hydroxy (Vit-D Deficiency, Fractures)    Follow-up pending labs, yearly for physical

## 2019-11-11 LAB — VITAMIN D 25 HYDROXY (VIT D DEFICIENCY, FRACTURES): Vit D, 25-Hydroxy: 20 ng/mL — ABNORMAL LOW (ref 30.0–100.0)

## 2019-11-11 LAB — COMPREHENSIVE METABOLIC PANEL
ALT: 12 IU/L (ref 0–32)
AST: 12 IU/L (ref 0–40)
Albumin/Globulin Ratio: 1.8 (ref 1.2–2.2)
Albumin: 4.4 g/dL (ref 3.8–4.8)
Alkaline Phosphatase: 64 IU/L (ref 44–121)
BUN/Creatinine Ratio: 11 (ref 9–23)
BUN: 13 mg/dL (ref 6–24)
Bilirubin Total: 0.6 mg/dL (ref 0.0–1.2)
CO2: 25 mmol/L (ref 20–29)
Calcium: 9.6 mg/dL (ref 8.7–10.2)
Chloride: 102 mmol/L (ref 96–106)
Creatinine, Ser: 1.18 mg/dL — ABNORMAL HIGH (ref 0.57–1.00)
GFR calc Af Amer: 66 mL/min/{1.73_m2} (ref >59)
GFR calc non Af Amer: 57 mL/min/{1.73_m2} — ABNORMAL LOW (ref >59)
Globulin, Total: 2.4 g/dL (ref 1.5–4.5)
Glucose: 124 mg/dL — ABNORMAL HIGH (ref 65–99)
Potassium: 3.9 mmol/L (ref 3.5–5.2)
Sodium: 139 mmol/L (ref 134–144)
Total Protein: 6.8 g/dL (ref 6.0–8.5)

## 2019-11-11 LAB — HEMOGLOBIN A1C
Est. average glucose Bld gHb Est-mCnc: 131 mg/dL
Hgb A1c MFr Bld: 6.2 % — ABNORMAL HIGH (ref 4.8–5.6)

## 2019-11-11 LAB — LDL CHOLESTEROL, DIRECT: LDL Direct: 103 mg/dL — ABNORMAL HIGH (ref 0–99)

## 2019-11-13 ENCOUNTER — Other Ambulatory Visit: Payer: Self-pay | Admitting: Medical

## 2019-11-13 MED ORDER — RIZATRIPTAN BENZOATE 10 MG PO TABS: 10.0000 mg | ORAL_TABLET | ORAL | 2 refills | Status: DC | PRN

## 2019-11-13 MED ORDER — VITAMIN D 25 MCG (1000 UNIT) PO TABS: 2000.0000 [IU] | ORAL_TABLET | Freq: Every day | ORAL | 3 refills | Status: DC

## 2019-11-13 MED ORDER — BISOPROLOL-HYDROCHLOROTHIAZIDE 10-6.25 MG PO TABS: 1.0000 | ORAL_TABLET | Freq: Every day | ORAL | 0 refills | Status: DC

## 2019-11-13 MED FILL — BISOPROLOL-HCTZ 10-6.25 MG: 10-6.25 | 90 days supply | Qty: 90 | Fill #0

## 2019-11-13 MED FILL — RIZATRIPTAN BENZOATE 10 MG: 10 | 30 days supply | Qty: 10 | Fill #0

## 2019-11-23 ENCOUNTER — Telehealth: Payer: Self-pay | Admitting: Medical

## 2019-11-23 NOTE — Telephone Encounter (Signed)
Received requested records from [facility] 

## 2019-11-24 ENCOUNTER — Encounter: Payer: Self-pay | Admitting: Medical

## 2019-11-24 ENCOUNTER — Telehealth: Payer: Self-pay | Admitting: Medical

## 2019-11-24 NOTE — Telephone Encounter (Signed)
abstracted 

## 2019-11-24 NOTE — Telephone Encounter (Signed)
Abstract mammogram findings

## 2019-11-27 MED FILL — RIZATRIPTAN BENZOATE 10 MG: 10 | 30 days supply | Qty: 10 | Fill #0

## 2019-11-27 MED FILL — BISOPROLOL-HCTZ 10-6.25 MG: 10-6.25 | 90 days supply | Qty: 90 | Fill #0

## 2019-11-28 ENCOUNTER — Encounter: Payer: Self-pay | Admitting: Medical

## 2019-12-28 ENCOUNTER — Telehealth: Payer: Self-pay | Admitting: Medical

## 2019-12-28 NOTE — Telephone Encounter (Signed)
Received requested records from [facility].  ?

## 2019-12-29 ENCOUNTER — Encounter: Payer: Self-pay | Admitting: Medical

## 2020-01-01 ENCOUNTER — Telehealth: Payer: Self-pay | Admitting: Medical

## 2020-01-01 NOTE — Telephone Encounter (Signed)
At minimum she should have BMET lab and BP check that visit with nurse

## 2020-01-01 NOTE — Telephone Encounter (Signed)
Pt called to make a 3 month follow up for bp. She only wanted a nurse visit but looks like Vincenza Hews wanted to see her. Pt states she only wanted a nurse visit. Please advise pt is a nurse visit is ok or she needs to see provider. Pt can be reached at a [phone].

## 2020-01-02 ENCOUNTER — Other Ambulatory Visit: Payer: Self-pay | Admitting: Medical

## 2020-01-02 DIAGNOSIS — I10 Essential (primary) hypertension: Secondary | ICD-10-CM

## 2020-01-02 DIAGNOSIS — R7301 Impaired fasting glucose: Secondary | ICD-10-CM

## 2020-01-02 NOTE — Telephone Encounter (Signed)
Called pt and a message was left informing pt of nurse visit with labs, message ask pt to call back to schedule. Please place orders for BMET.

## 2020-01-12 DIAGNOSIS — Z20822 Contact with and (suspected) exposure to covid-19: Secondary | ICD-10-CM | POA: Diagnosis not present

## 2020-01-23 DIAGNOSIS — Z1152 Encounter for screening for COVID-19: Secondary | ICD-10-CM | POA: Diagnosis not present

## 2020-01-25 ENCOUNTER — Other Ambulatory Visit: Payer: 59

## 2020-03-05 MED FILL — RIZATRIPTAN BENZOATE 10 MG: 10 | 30 days supply | Qty: 10 | Fill #1

## 2020-04-13 DIAGNOSIS — I10 Essential (primary) hypertension: Secondary | ICD-10-CM | POA: Diagnosis not present

## 2020-04-13 DIAGNOSIS — N3001 Acute cystitis with hematuria: Secondary | ICD-10-CM | POA: Diagnosis not present

## 2020-04-13 DIAGNOSIS — N343 Urethral syndrome, unspecified: Secondary | ICD-10-CM | POA: Diagnosis not present

## 2020-04-15 ENCOUNTER — Other Ambulatory Visit: Payer: Self-pay

## 2020-04-15 ENCOUNTER — Telehealth: Payer: Self-pay | Admitting: Internal Medicine

## 2020-04-15 NOTE — Telephone Encounter (Signed)
That is fine, please refer 

## 2020-04-15 NOTE — Telephone Encounter (Signed)
Is this ok to refill, reply to [facility] 

## 2020-04-15 NOTE — Telephone Encounter (Signed)
Pt would like to get a referral to Nutritionist.

## 2020-04-15 NOTE — Telephone Encounter (Signed)
She is due back for either follow-up which is what I had recommended last time, or at bare minimum needs to come in nurse visit BP  check and BMET lab.  Schedule then I'll send refill.

## 2020-04-16 ENCOUNTER — Other Ambulatory Visit (HOSPITAL_COMMUNITY): Payer: Self-pay

## 2020-04-16 ENCOUNTER — Other Ambulatory Visit: Payer: Self-pay

## 2020-04-16 DIAGNOSIS — E669 Obesity, unspecified: Secondary | ICD-10-CM

## 2020-04-16 DIAGNOSIS — Z6841 Body Mass Index (BMI) 40.0 and over, adult: Secondary | ICD-10-CM

## 2020-04-16 DIAGNOSIS — R7301 Impaired fasting glucose: Secondary | ICD-10-CM

## 2020-04-16 MED ORDER — BISOPROLOL-HYDROCHLOROTHIAZIDE 10-6.25 MG PO TABS
1.0000 | ORAL_TABLET | Freq: Every day | ORAL | 0 refills | Status: DC
  Filled 2020-04-16: qty 30, 30d supply, fill #0

## 2020-04-16 NOTE — Telephone Encounter (Signed)
Referral was sent.

## 2020-04-16 NOTE — Telephone Encounter (Signed)
Pt has appointment tomorrow.

## 2020-04-17 ENCOUNTER — Other Ambulatory Visit (HOSPITAL_COMMUNITY): Payer: Self-pay

## 2020-04-17 ENCOUNTER — Encounter: Payer: Self-pay | Admitting: Medical

## 2020-04-17 ENCOUNTER — Other Ambulatory Visit: Payer: Self-pay

## 2020-04-17 ENCOUNTER — Ambulatory Visit: Payer: 59 | Admitting: Medical

## 2020-04-17 VITALS — BP 150/106 | HR 51 | Ht 66.0 in | Wt 272.8 lb

## 2020-04-17 DIAGNOSIS — I10 Essential (primary) hypertension: Secondary | ICD-10-CM

## 2020-04-17 DIAGNOSIS — Z823 Family history of stroke: Secondary | ICD-10-CM | POA: Diagnosis not present

## 2020-04-17 DIAGNOSIS — R7301 Impaired fasting glucose: Secondary | ICD-10-CM | POA: Diagnosis not present

## 2020-04-17 DIAGNOSIS — R7989 Other specified abnormal findings of blood chemistry: Secondary | ICD-10-CM | POA: Diagnosis not present

## 2020-04-17 DIAGNOSIS — Z6841 Body Mass Index (BMI) 40.0 and over, adult: Secondary | ICD-10-CM | POA: Diagnosis not present

## 2020-04-17 MED ORDER — VALSARTAN 80 MG PO TABS
80.0000 mg | ORAL_TABLET | Freq: Every day | ORAL | 2 refills | Status: DC
  Filled 2020-04-17: qty 30, 30d supply, fill #0
  Filled 2020-05-23: qty 30, 30d supply, fill #1
  Filled 2020-07-19: qty 30, 30d supply, fill #2

## 2020-04-17 MED ORDER — VALSARTAN 80 MG PO TABS
80.0000 mg | ORAL_TABLET | Freq: Every day | ORAL | 2 refills | Status: DC
Start: 2020-04-17 — End: 2020-04-17
  Filled 2020-04-17: qty 30, 30d supply, fill #0

## 2020-04-17 NOTE — Progress Notes (Signed)
Subjective:   HPI  Christine Fields is a 40 y.o. female who presents for Chief Complaint  Patient presents with  . Hypertension    150/100 last Friday     Patient Care Team: Haydn Hutsell, Kermit Balo, PA-C as PCP - General (Family Medicine) Sees dentist:[facility]) Sees eye doctor:Dr.Shapiro Dr. Marcene Corning, [facility] Dr. Corinna Capra, weight management clinic [facility]    Concerns: Here for recheck on BP.   Checking BPs at home.  Friday 150/100 at home.   Saturday BP about the same.  In recent months, 138/98.    Compliant with Bisoprolol HCT 10/6.25mg  daily   Has appt with nutritionist coming up.  Still working on efforts to lose weight.   Has allergy to amlodipine.  Has been on plain HCTZ but had lots of urine frequency on this.  Family history: Sister has HTN, father had Stroke.     Reviewed their medical, surgical, family, social, medication, and allergy history and updated chart as appropriate.   Past Medical History:  Diagnosis Date  . Alkaline phosphatase elevation 2021  . Hypertension   . Migraine   . Pre-diabetes   . Vitamin D deficiency     Family History  Problem Relation Age of Onset  . Diabetes Father   . Hypertension Father   . Stroke Father   . Hypertension Maternal Grandmother   . Diabetes Maternal Grandfather   . Cancer Maternal Grandfather      Current Outpatient Medications:  .  bisoprolol-hydrochlorothiazide (ZIAC) 10-6.25 MG tablet, TAKE 1 TABLET BY MOUTH DAILY., Disp: 30 tablet, Rfl: 0 .  cholecalciferol (VITAMIN D3) 25 MCG (1000 UNIT) tablet, Take 2 tablets (2,000 Units total) by mouth daily., Disp: 180 tablet, Rfl: 3 .  levonorgestrel (MIRENA, 52 MG,) 20 MCG/24HR IUD, , Disp: , Rfl:  .  rizatriptan (MAXALT) 10 MG tablet, TAKE 1 TABLET (10 MG TOTAL) BY MOUTH AS NEEDED FOR MIGRAINE. MAY REPEAT IN 2 HOURS IF NEEDED, Disp: 10 tablet, Rfl: 2 .  valsartan (DIOVAN) 80 MG tablet, Take 1 tablet (80 mg total) by mouth daily.,  Disp: 30 tablet, Rfl: 2  Allergies  Allergen Reactions  . Amlodipine     Headache   . Kiwi Extract     Tongue and mouth burning/irritation     Review of Systems As in subjective     Objective:  BP (!) 150/106   Pulse (!) 51   Ht 5\' 6"  (1.676 m)   Wt 272 lb 12.8 oz (123.7 kg)   SpO2 98%   BMI 44.03 kg/m  BP Readings from Last 3 Encounters:  04/17/20 (!) 150/106  11/10/19 (!) 124/94  07/13/19 (!) 166/111   Wt Readings from Last 3 Encounters:  04/17/20 272 lb 12.8 oz (123.7 kg)  11/10/19 265 lb 12.8 oz (120.6 kg)  07/13/19 259 lb 7.7 oz (117.7 kg)    General appearance: alert, no distress, WD/WN, African American female Neck: supple, no lymphadenopathy, no thyromegaly, no masses, normal ROM, no bruits Heart: RRR, normal S1, S2, no murmurs Lungs: CTA bilaterally, no wheezes, rhonchi, or rales Abdomen: nontender, no mass, no bruits Extremities: no edema, no cyanosis, no clubbing Pulses: 2+ symmetric, upper and lower extremities, normal cap refill     Assessment and Plan :   Encounter Diagnoses  Name Primary?  . Essential hypertension, benign Yes  . Impaired fasting blood sugar   . BMI 40.0-44.9, adult (HCC)   . Family history of stroke   . Elevated serum  creatinine     HTN- not at goal.  C/t lifestyle changes to lose weight, no major concern for sleep apnea, c/t Ziac, but add Valsartan.  Discussed risks/benefits of medicaiton.     Referral to advanced HTN clinic.  She will likely need echo, possible renal artery [place] if creatinine still elevated as it was last visit.   Justyna was seen today for hypertension.  Diagnoses and all orders for this visit:  Essential hypertension, benign -     Ambulatory referral to Advanced Hypertension Clinic - [facility] [address] -     Basic metabolic panel  Impaired fasting blood sugar -     Basic metabolic panel  BMI 40.0-44.9, adult (HCC)  Family history of stroke  Elevated serum creatinine  Other orders -      Discontinue: valsartan (DIOVAN) 80 MG tablet; Take 1 tablet (80 mg total) by mouth daily. -     valsartan (DIOVAN) 80 MG tablet; Take 1 tablet (80 mg total) by mouth daily.    F/u pending lab, HTN clinic

## 2020-04-18 LAB — BASIC METABOLIC PANEL
BUN/Creatinine Ratio: 16 (ref 9–23)
BUN: 11 mg/dL (ref 6–24)
CO2: 20 mmol/L (ref 20–29)
Calcium: 9.6 mg/dL (ref 8.7–10.2)
Chloride: 99 mmol/L (ref 96–106)
Creatinine, Ser: 0.69 mg/dL (ref 0.57–1.00)
Glucose: 88 mg/dL (ref 65–99)
Potassium: 4.4 mmol/L (ref 3.5–5.2)
Sodium: 137 mmol/L (ref 134–144)
eGFR: 111 mL/min/{1.73_m2} (ref >59)

## 2020-05-01 ENCOUNTER — Other Ambulatory Visit (HOSPITAL_COMMUNITY): Payer: Self-pay

## 2020-05-01 MED FILL — Rizatriptan Benzoate Tab 10 MG (Base Equivalent): ORAL | 30 days supply | Qty: 10 | Fill #0 | Status: AC

## 2020-05-17 DIAGNOSIS — H52223 Regular astigmatism, bilateral: Secondary | ICD-10-CM | POA: Diagnosis not present

## 2020-05-17 DIAGNOSIS — H5213 Myopia, bilateral: Secondary | ICD-10-CM | POA: Diagnosis not present

## 2020-05-23 ENCOUNTER — Other Ambulatory Visit (HOSPITAL_COMMUNITY): Payer: Self-pay

## 2020-05-24 ENCOUNTER — Other Ambulatory Visit: Payer: Self-pay

## 2020-05-24 ENCOUNTER — Encounter: Payer: Self-pay | Admitting: Registered"

## 2020-05-24 ENCOUNTER — Encounter: Payer: 59 | Attending: Medical | Admitting: Registered"

## 2020-05-24 DIAGNOSIS — R7301 Impaired fasting glucose: Secondary | ICD-10-CM | POA: Diagnosis not present

## 2020-05-24 NOTE — Patient Instructions (Addendum)
To help reduce blood pressure & blood sugar 7-8 hours sleep each night 4x week for 2 months Stress reducing activities: exercising, puzzles 4x week instead working extra on laptop Meal planning every Sunday for 3 days per week  Ideas to help with meal prep: Hello Fresh or similar service, [employer] for local service for prepared meals, Anatomy of a grain bowl.

## 2020-05-24 NOTE — Progress Notes (Signed)
Employee visit #1  Medical Nutrition Therapy  Appointment Start time:  1040  Appointment End time:  1145  Primary concerns today: diabetes prevention, hypertension  Referral diagnosis: R73.01 (ICD-10-CM) - Impaired fasting blood sugar Preferred learning style: no preference indicated Learning readiness: ready  NUTRITION ASSESSMENT   Anthropometrics  Wt Readings from Last 3 Encounters:  04/17/20 272 lb 12.8 oz (123.7 kg)  11/10/19 265 lb 12.8 oz (120.6 kg)  07/13/19 259 lb 7.7 oz (117.7 kg)   Clinical Medical Hx: pre-diabetes, Vit D deficiency 07/06/2018 Medications: Vit D 1000 u Labs: A1c 6.2% 11/10/19;  Notable Signs/Symptoms: knee surgery July 2021   Lifestyle & Dietary Hx Patient states she works to jobs and stops at [employer] for iced caramel coffee and fries when going from one job to the other. Pt states eats something convenient in the morning she can take out the door on the way to work.  Dietary notes: Pt states she bought bowls for meal prepping but doesn't know what to put in them. Pt states she has looked into the DASH diet but confused by the foods listed in some of the sources she found that seem to be high in sodium. Pt states she has started avoiding milk because she heard it is not good, tried almond milk and doesn't like it.  Physical activity: Pt states she enjoys exercise and was more active including running until knee surgery last July.  Estimated daily fluid intake: 48 oz Supplements: no Sleep: ~5 hrs Stress / self-care: walking, travel abroad, puzzles, massage Current average weekly physical activity: walking,   24-Hr Dietary Recall First Meal: everything bagel, butter, coffee OR ham sandwich Snack: none OR chips or pretzels Second Meal: hospital cafeteria: meatloaf, green beans, mashed potatoes OR sauteed mixed vegetables, rice OR salad with croutons, grilled chicken Snack: fries, [employer] caramel iced coffee Third Meal: take out, tired  at end of day Snack:  Beverages: water, iced caramel coffee  Estimated Energy Needs Calories: ~1800-2000   NUTRITION DIAGNOSIS  NB-1.1 Food and nutrition-related knowledge deficit As related to factors that affect blood sugar control.  As evidenced by diet and lifestyle that patient was not aware of that contribute to insulin resistance such as stress, lack of sleep and how those can affect dietary choices.   NUTRITION INTERVENTION  Nutrition education (E-1) on the following topics:  . Importance of sleep and stress management for health and need to support other healthy behavior changes . Metformin and exercise effect on insulin sensitivity . Interpreting A1c lab, role of insulin and glucose in blood sugar control . Balanced eating  Handouts Provided Include   Sleep Hygiene  DASH in brief  Anatomy of a grain bowl  A1c chart  Learning Style & Readiness for Change Teaching method utilized: Visual & Auditory  Demonstrated degree of understanding via: Teach Back  Barriers to learning/adherence to lifestyle change: none  Goals Established by Pt  (To help reduce blood pressure & blood sugar)  7-8 hours sleep each night 4x week for 2 months  Stress reducing activities: exercising, puzzles 4x week instead working extra on laptop  Meal planning every Sunday for 3 days per week  Patient was interested in checking blood sugar. Provided Contour Next One meter Lot #[id number] Exp: 08/11/2020 CBG: 111 mg/dL  MONITORING & EVALUATION Dietary intake, weekly physical activity, and sleep, blood pressure in 7 weeks.  Next Steps  Patient is to work on goals and monitor blood pressure and blood sugar for self-evaluation  of progress.

## 2020-06-14 DIAGNOSIS — Z20822 Contact with and (suspected) exposure to covid-19: Secondary | ICD-10-CM | POA: Diagnosis not present

## 2020-06-21 ENCOUNTER — Telehealth: Payer: Self-pay | Admitting: Medical

## 2020-06-21 NOTE — Telephone Encounter (Signed)
Pt needs refill on her rizatripatan please send to the [facility]

## 2020-06-24 ENCOUNTER — Other Ambulatory Visit: Payer: Self-pay | Admitting: Medical

## 2020-06-24 ENCOUNTER — Encounter: Payer: 59 | Admitting: Medical

## 2020-06-24 ENCOUNTER — Other Ambulatory Visit (HOSPITAL_COMMUNITY): Payer: Self-pay

## 2020-06-24 MED ORDER — RIZATRIPTAN BENZOATE 10 MG PO TABS
ORAL_TABLET | ORAL | 2 refills | Status: DC | PRN
  Filled 2020-06-24: qty 10, 30d supply, fill #0

## 2020-06-27 ENCOUNTER — Other Ambulatory Visit (HOSPITAL_COMMUNITY): Payer: Self-pay

## 2020-06-27 MED ORDER — CARESTART COVID-19 HOME TEST VI KIT
PACK | 0 refills | Status: DC
  Filled 2020-06-27: qty 4, 4d supply, fill #0

## 2020-06-30 DIAGNOSIS — Z20822 Contact with and (suspected) exposure to covid-19: Secondary | ICD-10-CM | POA: Diagnosis not present

## 2020-07-19 ENCOUNTER — Other Ambulatory Visit: Payer: Self-pay

## 2020-07-19 ENCOUNTER — Other Ambulatory Visit: Payer: Self-pay | Admitting: Medical

## 2020-07-19 ENCOUNTER — Other Ambulatory Visit (HOSPITAL_COMMUNITY): Payer: Self-pay

## 2020-07-19 ENCOUNTER — Encounter: Payer: 59 | Attending: Medical | Admitting: Registered"

## 2020-07-19 VITALS — Wt 268.1 lb

## 2020-07-19 DIAGNOSIS — Z1331 Encounter for screening for depression: Secondary | ICD-10-CM | POA: Insufficient documentation

## 2020-07-19 DIAGNOSIS — Z713 Dietary counseling and surveillance: Secondary | ICD-10-CM

## 2020-07-19 MED ORDER — BISOPROLOL-HYDROCHLOROTHIAZIDE 10-6.25 MG PO TABS
1.0000 | ORAL_TABLET | Freq: Every day | ORAL | 2 refills | Status: DC
  Filled 2020-07-19: qty 30, 30d supply, fill #0
  Filled 2020-10-01: qty 30, 30d supply, fill #1

## 2020-07-19 NOTE — Progress Notes (Signed)
[employer] Employee visit #2  Medical Nutrition Therapy  Appointment Start time:  0930  Appointment End time:  1000  Primary concerns today: diabetes prevention, hypertension  Referral diagnosis: R73.01 (ICD-10-CM) - Impaired fasting blood sugar Preferred learning style: no preference indicated Learning readiness: ready  NUTRITION ASSESSMENT   Anthropometrics  Wt Readings from Last 3 Encounters:  07/19/20 268 lb 1.6 oz (121.6 kg)  04/17/20 272 lb 12.8 oz (123.7 kg)  11/10/19 265 lb 12.8 oz (120.6 kg)     Clinical Medical Hx: pre-diabetes, Vit D deficiency 07/06/2018 Medications: Vit D 1000 u, BP pressure med Labs: A1c 6.2% 11/10/19;  Notable Signs/Symptoms: knee surgery July 2021   Lifestyle & Dietary Hx Pt states she is getting better sleep, cleaned up sleep hygiene, bedroom environment, improved mood. Pt reports she stopped drinking bottled water and just using her filtered water.  Changed habit of getting iced coffee for pick-me-up and now will get something to eat instead. Pt sates she has stopped doing lunch meetings and uses lunch time to de-stress.  Started checking blood sugar occasionally: pre-meal 112 mg/dL .  Physical activity: knee brace allows more activity now; walked 3 miles this week, going to gym, cross fit (modified for knee)    Estimated daily fluid intake: 48 oz Supplements: no Sleep: ~8 hrs (improved!) Stress / self-care: walking, travel abroad, puzzles, massage Current average weekly physical activity: doesn't track exact amount  24-Hr Dietary Recall  only beverages assessed this visit First Meal:  Snack:  Second Meal:  Snack:  Third Meal:  Snack:  Beverages: water, herbal tea with 1 tsp sugar, ginger ale, cutting back iced caramel coffee from 5x/week to 1-2x/week  Estimated Energy Needs Calories: ~1800-2000   NUTRITION DIAGNOSIS  NB-1.1 Food and nutrition-related knowledge deficit As related to factors that affect blood sugar control.  As  evidenced by diet and lifestyle that patient was not aware of that contribute to insulin resistance such as stress, lack of sleep and how those can affect dietary choices.   NUTRITION INTERVENTION  Nutrition education (E-1) on the following topics:  Other measures than just weight of progress toward health goals Balance of different types of movement  Handouts Provided Include  none  Learning Style & Readiness for Change Teaching method utilized: Visual & Auditory  Demonstrated degree of understanding via: Teach Back  Barriers to learning/adherence to lifestyle change: none  Goals Established by Pt   Increase water intake: Refill water bottle middle of the day and finish. Exercise:  Look into a beginner yoga or piliates   MONITORING & EVALUATION Dietary intake, weekly physical activity, and sleep, blood pressure in 4-6 weeks.  Next Steps  Patient is to work on goals and monitor mood, color of urine (for hydration) and blood sugar for self-evaluation of progress.

## 2020-07-19 NOTE — Patient Instructions (Addendum)
Increase water intake: Refill water bottle middle of the day and finish. Exercise:  Look into a beginner yoga or piliates

## 2020-08-30 ENCOUNTER — Encounter: Payer: Self-pay | Admitting: Registered"

## 2020-08-30 ENCOUNTER — Encounter: Payer: 59 | Attending: Medical | Admitting: Registered"

## 2020-08-30 ENCOUNTER — Other Ambulatory Visit: Payer: Self-pay

## 2020-08-30 VITALS — Wt 264.0 lb

## 2020-08-30 DIAGNOSIS — Z713 Dietary counseling and surveillance: Secondary | ICD-10-CM | POA: Insufficient documentation

## 2020-08-30 NOTE — Progress Notes (Signed)
[employer] Employee visit #3  Medical Nutrition Therapy  Appointment Start time:  1050  Appointment End time:  1110  Primary concerns today: diabetes prevention, hypertension  Referral diagnosis: R73.01 (ICD-10-CM) - Impaired fasting blood sugar Preferred learning style: no preference indicated Learning readiness: ready  NUTRITION ASSESSMENT   Anthropometrics  Wt Readings from Last 3 Encounters:  08/30/20 264 lb (119.7 kg)  07/19/20 268 lb 1.6 oz (121.6 kg)  04/17/20 272 lb 12.8 oz (123.7 kg)      Clinical Medical Hx: pre-diabetes, Vit D deficiency 07/06/2018 Medications: Vit D 1000 u, BP pressure med Labs: A1c 6.2% 11/10/19;  Notable Signs/Symptoms: knee surgery July 2021   Lifestyle & Dietary Hx Pt states she has been focusing on self care and it's working to reduce stress eating, and bring down blood pressure. Pt has made back yard a small oasis for a place to relax.  Pt states she is still working getting better sleep, dogs wake her up during the night and still want to sleep in the bed.  Pt states she has pretty much stopped getting iced coffee and increasing vegetables and fresh foods even though food prices are increasing. Pt continues to be mindful of increasing water intake.  Pt reports continued occasionally checking blood sugar, FBG was 99 mg/dL a couple of days ago.  Estimated daily fluid intake: 48 oz Supplements: no Sleep: ~8 hrs Stress / self-care: walking, travel abroad, puzzles, massage Physical activity: Cross fit on Sat, walks in neighborhood often, [employer] app 4x week, does additional activity in home.  24-Hr Dietary Recall   First Meal: scrambled egg, 1/2 English muffin, 2 bacon Snack:  Second Meal: Tuna sandwich, water Snack:  Third Meal: chicken wings, a few chips Snack: 2 fun size candy Beverages: water, herbal tea with 1 tsp sugar, ginger ale, Has only had 1 iced coffee since last visit.  Estimated Energy Needs Calories:  ~1800-2000   NUTRITION DIAGNOSIS  NB-1.1 Food and nutrition-related knowledge deficit As related to factors that affect blood sugar control.  As evidenced by diet and lifestyle that patient was not aware of that contribute to insulin resistance such as stress, lack of sleep and how those can affect dietary choices.   NUTRITION INTERVENTION  Nutrition education (E-1) on the following topics:  Infused water  Handouts Provided Include  none  Learning Style & Readiness for Change Teaching method utilized: Visual & Auditory  Demonstrated degree of understanding via: Teach Back  Barriers to learning/adherence to lifestyle change: none  Goals Established by Pt   Increase water intake: Continue to work on water intake Continue with physical activity as tolerated Continue with creating spaces that contribute to reduced stress  MONITORING & EVALUATION Dietary intake, weekly physical activity, and sleep, blood pressure prn  Next Steps  Patient is to work on goals and monitor mood, color of urine (for hydration) and blood sugar for self-evaluation of progress. Return for follow up as needed.

## 2020-10-01 ENCOUNTER — Other Ambulatory Visit (HOSPITAL_COMMUNITY): Payer: Self-pay

## 2020-10-01 ENCOUNTER — Other Ambulatory Visit: Payer: Self-pay | Admitting: Medical

## 2020-10-01 MED ORDER — VALSARTAN 80 MG PO TABS
80.0000 mg | ORAL_TABLET | Freq: Every day | ORAL | 1 refills | Status: DC
  Filled 2020-10-01: qty 30, 30d supply, fill #0

## 2020-10-07 ENCOUNTER — Other Ambulatory Visit (HOSPITAL_COMMUNITY): Payer: Self-pay

## 2020-10-11 ENCOUNTER — Other Ambulatory Visit (INDEPENDENT_AMBULATORY_CARE_PROVIDER_SITE_OTHER): Payer: 59

## 2020-10-11 ENCOUNTER — Other Ambulatory Visit: Payer: Self-pay

## 2020-10-11 DIAGNOSIS — Z23 Encounter for immunization: Secondary | ICD-10-CM | POA: Diagnosis not present

## 2020-10-14 ENCOUNTER — Other Ambulatory Visit (HOSPITAL_COMMUNITY): Payer: Self-pay

## 2020-10-14 DIAGNOSIS — Z113 Encounter for screening for infections with a predominantly sexual mode of transmission: Secondary | ICD-10-CM | POA: Diagnosis not present

## 2020-10-14 DIAGNOSIS — N898 Other specified noninflammatory disorders of vagina: Secondary | ICD-10-CM | POA: Diagnosis not present

## 2020-10-14 DIAGNOSIS — I10 Essential (primary) hypertension: Secondary | ICD-10-CM | POA: Diagnosis not present

## 2020-10-14 DIAGNOSIS — Z975 Presence of (intrauterine) contraceptive device: Secondary | ICD-10-CM | POA: Diagnosis not present

## 2020-10-14 DIAGNOSIS — Z1231 Encounter for screening mammogram for malignant neoplasm of breast: Secondary | ICD-10-CM | POA: Diagnosis not present

## 2020-10-14 DIAGNOSIS — Z01419 Encounter for gynecological examination (general) (routine) without abnormal findings: Secondary | ICD-10-CM | POA: Diagnosis not present

## 2020-10-14 MED ORDER — FLUCONAZOLE 150 MG PO TABS
150.0000 mg | ORAL_TABLET | Freq: Every day | ORAL | 0 refills | Status: DC
  Filled 2020-10-14: qty 2, 3d supply, fill #0

## 2020-10-14 MED ORDER — METRONIDAZOLE 500 MG PO TABS
500.0000 mg | ORAL_TABLET | Freq: Two times a day (BID) | ORAL | 0 refills | Status: DC
  Filled 2020-10-14: qty 14, 7d supply, fill #0

## 2020-11-12 ENCOUNTER — Ambulatory Visit (INDEPENDENT_AMBULATORY_CARE_PROVIDER_SITE_OTHER): Payer: 59 | Admitting: Medical

## 2020-11-12 ENCOUNTER — Other Ambulatory Visit (HOSPITAL_COMMUNITY): Payer: Self-pay

## 2020-11-12 ENCOUNTER — Encounter: Payer: Self-pay | Admitting: Medical

## 2020-11-12 ENCOUNTER — Other Ambulatory Visit: Payer: Self-pay

## 2020-11-12 VITALS — BP 120/70 | HR 52 | Ht 67.5 in | Wt 267.2 lb

## 2020-11-12 DIAGNOSIS — Z975 Presence of (intrauterine) contraceptive device: Secondary | ICD-10-CM | POA: Diagnosis not present

## 2020-11-12 DIAGNOSIS — Z0000 Encounter for general adult medical examination without abnormal findings: Secondary | ICD-10-CM

## 2020-11-12 DIAGNOSIS — G43909 Migraine, unspecified, not intractable, without status migrainosus: Secondary | ICD-10-CM | POA: Diagnosis not present

## 2020-11-12 DIAGNOSIS — I10 Essential (primary) hypertension: Secondary | ICD-10-CM | POA: Diagnosis not present

## 2020-11-12 DIAGNOSIS — E559 Vitamin D deficiency, unspecified: Secondary | ICD-10-CM | POA: Diagnosis not present

## 2020-11-12 DIAGNOSIS — Z6841 Body Mass Index (BMI) 40.0 and over, adult: Secondary | ICD-10-CM

## 2020-11-12 DIAGNOSIS — E786 Lipoprotein deficiency: Secondary | ICD-10-CM

## 2020-11-12 DIAGNOSIS — R7301 Impaired fasting glucose: Secondary | ICD-10-CM

## 2020-11-12 DIAGNOSIS — Z823 Family history of stroke: Secondary | ICD-10-CM

## 2020-11-12 MED ORDER — RIZATRIPTAN BENZOATE 10 MG PO TABS
ORAL_TABLET | ORAL | 2 refills | Status: DC | PRN
  Filled 2020-11-12: qty 10, 30d supply, fill #0
  Filled 2021-03-11: qty 10, 30d supply, fill #1

## 2020-11-12 MED ORDER — VALSARTAN 80 MG PO TABS
80.0000 mg | ORAL_TABLET | Freq: Every day | ORAL | 3 refills | Status: DC
  Filled 2020-11-12: qty 90, 90d supply, fill #0
  Filled 2021-04-01: qty 90, 90d supply, fill #1
  Filled 2021-09-02: qty 90, 90d supply, fill #2

## 2020-11-12 MED ORDER — BISOPROLOL-HYDROCHLOROTHIAZIDE 10-6.25 MG PO TABS
1.0000 | ORAL_TABLET | Freq: Every day | ORAL | 3 refills | Status: DC
  Filled 2020-11-12: qty 90, 90d supply, fill #0
  Filled 2021-04-01: qty 90, 90d supply, fill #1
  Filled 2021-09-02: qty 90, 90d supply, fill #2

## 2020-11-12 NOTE — Progress Notes (Signed)
Subjective:   HPI  Christine Fields is a 40 y.o. female who presents for Chief Complaint  Patient presents with   cpe, fasting    Fasting cpe, obgyn- Dr. Conard Novak     Patient Care Team: Tysinger, Camelia Eng, PA-C as PCP - General (Family Medicine) Sees dentist:[facility]) Sees eye doctor:Dr.Shapiro Dr. Melrose Nakayama, [facility] Dr. Jearld Lesch, weight management clinic [facility]    Concerns: Doing pretty well.   No particular c/o.   Trying to get exercise but time management is a mess.  HTN - compliant with medications, no issues, no chest pain, no dyspnea, no edema  Migraines - no recent c/o  Reviewed their medical, surgical, family, social, medication, and allergy history and updated chart as appropriate.  Past Medical History:  Diagnosis Date   Alkaline phosphatase elevation 2021   Hypertension    Migraine    Pre-diabetes    Vitamin D deficiency     Family History  Problem Relation Age of Onset   Diabetes Father    Hypertension Father    Stroke Father    Hypertension Maternal Grandmother    Diabetes Maternal Grandfather    Cancer Maternal Grandfather      Current Outpatient Medications:    cholecalciferol (VITAMIN D3) 25 MCG (1000 UNIT) tablet, Take 2 tablets (2,000 Units total) by mouth daily., Disp: 180 tablet, Rfl: 3   levonorgestrel (MIRENA, 52 MG,) 20 MCG/24HR IUD, , Disp: , Rfl:    bisoprolol-hydrochlorothiazide (ZIAC) 10-6.25 MG tablet, Take 1 tablet by mouth daily., Disp: 90 tablet, Rfl: 3   rizatriptan (MAXALT) 10 MG tablet, TAKE 1 TABLET (10 MG TOTAL) BY MOUTH AS NEEDED FOR MIGRAINE. MAY REPEAT IN 2 HOURS IF NEEDED, Disp: 10 tablet, Rfl: 2   valsartan (DIOVAN) 80 MG tablet, Take 1 tablet (80 mg total) by mouth daily., Disp: 90 tablet, Rfl: 3  Allergies  Allergen Reactions   Amlodipine     Headache    Kiwi Extract     Tongue and mouth burning/irritation     Review of Systems Constitutional:  -fever, -chills, -sweats, -unexpected weight change, -decreased appetite, -fatigue Allergy: -sneezing, -itching, -congestion Dermatology: -changing moles, --rash, -lumps ENT: -runny nose, -ear pain, -sore throat, -hoarseness, -sinus pain, -teeth pain, - ringing in ears, -hearing loss, -nosebleeds Cardiology: -chest pain, -palpitations, -swelling, -difficulty breathing when lying flat, -waking up short of breath Respiratory: -cough, -shortness of breath, -difficulty breathing with exercise or exertion, -wheezing, -coughing up blood Gastroenterology: -abdominal pain, -nausea, -vomiting, -diarrhea, -constipation, -blood in stool, -changes in bowel movement, -difficulty swallowing or eating Hematology: -bleeding, -bruising  Musculoskeletal: -joint aches, -muscle aches, -joint swelling, -back pain, -neck pain, -cramping, -changes in gait Ophthalmology: denies vision changes, eye redness, itching, discharge Urology: -burning with urination, -difficulty urinating, -blood in urine, -urinary frequency, -urgency, -incontinence Neurology: -headache, -weakness, -tingling, -numbness, -memory loss, -falls, -dizziness Psychology: -depressed mood, -agitation, -sleep problems Breast/gyn: -breast tendnerss, -discharge, -lumps, -vaginal discharge,- irregular periods, -heavy periods     Objective:  BP 120/70   Pulse (!) 52   Ht 5' 7.5" (1.715 m)   Wt 267 lb 3.2 oz (121.2 kg)   BMI 41.23 kg/m  BP Readings from Last 3 Encounters:  11/12/20 120/70  04/17/20 (!) 150/106  11/10/19 (!) 124/94   Wt Readings from Last 3 Encounters:  11/12/20 267 lb 3.2 oz (121.2 kg)  08/30/20 264 lb (119.7 kg)  07/19/20 268 lb 1.6 oz (121.6 kg)    General appearance: alert,  no distress, WD/WN, African American female Skin: unremarkable HEENT: normocephalic, conjunctiva/corneas normal, sclerae anicteric, PERRLA, EOMi Neck: supple, no lymphadenopathy, no thyromegaly, no masses, normal ROM, no bruits Chest: non tender, normal  shape and expansion Heart: RRR, normal S1, S2, no murmurs Lungs: CTA bilaterally, no wheezes, rhonchi, or rales Abdomen: +bs, soft, non tender, non distended, no masses, no hepatomegaly, no splenomegaly, no bruits Back: non tender, normal ROM, no scoliosis Musculoskeletal: upper extremities non tender, no obvious deformity, normal ROM throughout, lower extremities non tender, no obvious deformity, normal ROM throughout Extremities: no edema, no cyanosis, no clubbing Pulses: 2+ symmetric, upper and lower extremities, normal cap refill Neurological: alert, oriented x 3, CN2-12 intact, strength normal upper extremities and lower extremities, sensation normal throughout, DTRs 2+ throughout, no cerebellar signs, gait normal Psychiatric: normal affect, behavior normal, pleasant  Breast/gyn/rectal - deferred to gynecology   Assessment and Plan :   Encounter Diagnoses  Name Primary?   Encounter for health maintenance examination in adult Yes   BMI 40.0-44.9, adult ([place])    Essential hypertension, benign    Family history of stroke    Impaired fasting blood sugar    IUD (intrauterine device) in place    Low HDL (under 40)    Migraine without status migrainosus, not intractable, unspecified migraine type    Vitamin D deficiency      Physical exam - discussed and counseled on healthy lifestyle, diet, exercise, preventative care, vaccinations, sick and well care, proper use of emergency dept and after hours care, and addressed their concerns.    Health screening: Advised they see their eye doctor yearly for routine vision care. Advised they see their dentist yearly for routine dental care including hygiene visits twice yearly. See your gynecologist yearly for routine gynecological care.  Cancer screening Counseled on self breast exams, mammograms, cervical cancer screening  Colonoscopy:  Advised age 40yo   Vaccinations: Immunization History  Administered Date(s) Administered    Influenza,inj,Quad PF,6+ Mos 10/04/2019, 10/11/2020   MMR 06/16/1979, 02/22/2017   PFIZER(Purple Top)SARS-COV-2 Vaccination 01/27/2019, 02/17/2019   Tdap 04/03/2014     Separate significant issues discussed: Hypertension-continue current medication.    Vitamin D deficiency-labs today, continue supplement  Continue efforts to lose weight through healthy diet measures and exercise  Impaired glucose-labs today    Jahmya was seen today for cpe, fasting.  Diagnoses and all orders for this visit:  Encounter for health maintenance examination in adult -     Comprehensive metabolic panel -     CBC with Differential/Platelet -     Lipid panel -     Hemoglobin A1c -     TSH -     VITAMIN D 25 Hydroxy (Vit-D Deficiency, Fractures) -     Urinalysis  BMI 40.0-44.9, adult (HCC) -     Hemoglobin A1c -     TSH  Essential hypertension, benign  Family history of stroke  Impaired fasting blood sugar -     Hemoglobin A1c  IUD (intrauterine device) in place  Low HDL (under 40) -     Lipid panel  Migraine without status migrainosus, not intractable, unspecified migraine type  Vitamin D deficiency -     VITAMIN D 25 Hydroxy (Vit-D Deficiency, Fractures)  Other orders -     valsartan (DIOVAN) 80 MG tablet; Take 1 tablet (80 mg total) by mouth daily. -     bisoprolol-hydrochlorothiazide (ZIAC) 10-6.25 MG tablet; Take 1 tablet by mouth daily. -     rizatriptan (MAXALT)  10 MG tablet; TAKE 1 TABLET (10 MG TOTAL) BY MOUTH AS NEEDED FOR MIGRAINE. MAY REPEAT IN 2 HOURS IF NEEDED    Follow-up pending labs, yearly for physical

## 2020-11-13 ENCOUNTER — Other Ambulatory Visit (HOSPITAL_COMMUNITY): Payer: Self-pay

## 2020-11-13 LAB — COMPREHENSIVE METABOLIC PANEL
ALT: 10 IU/L (ref 0–32)
AST: 10 IU/L (ref 0–40)
Albumin/Globulin Ratio: 1.8 (ref 1.2–2.2)
Albumin: 4.5 g/dL (ref 3.8–4.8)
Alkaline Phosphatase: 70 IU/L (ref 44–121)
BUN/Creatinine Ratio: 16 (ref 9–23)
BUN: 13 mg/dL (ref 6–24)
Bilirubin Total: 0.7 mg/dL (ref 0.0–1.2)
CO2: 21 mmol/L (ref 20–29)
Calcium: 9.8 mg/dL (ref 8.7–10.2)
Chloride: 103 mmol/L (ref 96–106)
Creatinine, Ser: 0.82 mg/dL (ref 0.57–1.00)
Globulin, Total: 2.5 g/dL (ref 1.5–4.5)
Glucose: 112 mg/dL — ABNORMAL HIGH (ref 70–99)
Potassium: 4.1 mmol/L (ref 3.5–5.2)
Sodium: 137 mmol/L (ref 134–144)
Total Protein: 7 g/dL (ref 6.0–8.5)
eGFR: 92 mL/min/{1.73_m2} (ref >59)

## 2020-11-13 LAB — CBC WITH DIFFERENTIAL/PLATELET
Basophils Absolute: 0 10*3/uL (ref 0.0–0.2)
Basos: 0 %
EOS (ABSOLUTE): 0.1 10*3/uL (ref 0.0–0.4)
Eos: 2 %
Hematocrit: 41.9 % (ref 34.0–46.6)
Hemoglobin: 13.8 g/dL (ref 11.1–15.9)
Immature Grans (Abs): 0 10*3/uL (ref 0.0–0.1)
Immature Granulocytes: 0 %
Lymphocytes Absolute: 2.3 10*3/uL (ref 0.7–3.1)
Lymphs: 29 %
MCH: 28.6 pg (ref 26.6–33.0)
MCHC: 32.9 g/dL (ref 31.5–35.7)
MCV: 87 fL (ref 79–97)
Monocytes Absolute: 0.6 10*3/uL (ref 0.1–0.9)
Monocytes: 8 %
Neutrophils Absolute: 5 10*3/uL (ref 1.4–7.0)
Neutrophils: 61 %
Platelets: 212 10*3/uL (ref 150–450)
RBC: 4.83 x10E6/uL (ref 3.77–5.28)
RDW: 13.1 % (ref 11.7–15.4)
WBC: 8.1 10*3/uL (ref 3.4–10.8)

## 2020-11-13 LAB — HEMOGLOBIN A1C
Est. average glucose Bld gHb Est-mCnc: 128 mg/dL
Hgb A1c MFr Bld: 6.1 % — ABNORMAL HIGH (ref 4.8–5.6)

## 2020-11-13 LAB — VITAMIN D 25 HYDROXY (VIT D DEFICIENCY, FRACTURES): Vit D, 25-Hydroxy: 30.1 ng/mL (ref 30.0–100.0)

## 2020-11-13 LAB — URINALYSIS
Bilirubin, UA: NEGATIVE
Glucose, UA: NEGATIVE
Ketones, UA: NEGATIVE
Leukocytes,UA: NEGATIVE
Nitrite, UA: NEGATIVE
Protein,UA: NEGATIVE
RBC, UA: NEGATIVE
Specific Gravity, UA: 1.021 (ref 1.005–1.030)
Urobilinogen, Ur: 0.2 mg/dL (ref 0.2–1.0)
pH, UA: 5.5 (ref 5.0–7.5)

## 2020-11-13 LAB — TSH: TSH: 2 u[IU]/mL (ref 0.450–4.500)

## 2020-11-13 LAB — LIPID PANEL
Chol/HDL Ratio: 4.6 ratio — ABNORMAL HIGH (ref 0.0–4.4)
Cholesterol, Total: 175 mg/dL (ref 100–199)
HDL: 38 mg/dL — ABNORMAL LOW (ref >39)
LDL Chol Calc (NIH): 125 mg/dL — ABNORMAL HIGH (ref 0–99)
Triglycerides: 64 mg/dL (ref 0–149)
VLDL Cholesterol Cal: 12 mg/dL (ref 5–40)

## 2020-11-20 ENCOUNTER — Telehealth: Payer: Self-pay | Admitting: Medical

## 2020-11-20 NOTE — Telephone Encounter (Signed)
Pt called and said she is willing to start medication for her being borderline diabetic. She wants to know which medication would it be? She does not want Metformin.

## 2020-11-21 ENCOUNTER — Other Ambulatory Visit (HOSPITAL_COMMUNITY): Payer: Self-pay

## 2020-11-21 ENCOUNTER — Other Ambulatory Visit: Payer: Self-pay | Admitting: Medical

## 2020-11-21 MED ORDER — RYBELSUS 7 MG PO TABS
1.0000 | ORAL_TABLET | Freq: Every day | ORAL | 0 refills | Status: DC
  Filled 2020-11-21: qty 30, 30d supply, fill #0

## 2020-11-21 NOTE — Telephone Encounter (Signed)
Pt stated that she is not interested in the study but she would like the try the Rybelsus. She would like it sent to the [facility] outpatient pharmacy

## 2020-11-21 NOTE — Telephone Encounter (Signed)
Pt will come by and pick up a 30 day supply of 3mg  samples

## 2021-01-02 ENCOUNTER — Telehealth: Payer: Self-pay | Admitting: Medical

## 2021-01-02 ENCOUNTER — Other Ambulatory Visit (HOSPITAL_COMMUNITY): Payer: Self-pay

## 2021-01-02 MED ORDER — RYBELSUS 7 MG PO TABS
1.0000 | ORAL_TABLET | Freq: Every day | ORAL | 2 refills | Status: DC
  Filled 2021-01-02: qty 30, 30d supply, fill #0
  Filled 2021-02-11: qty 30, 30d supply, fill #1

## 2021-01-02 NOTE — Telephone Encounter (Signed)
Pt called for refills of rebelsus. Please send to [facility] out patient pharmacy. Pt can be reached at [phone]

## 2021-01-02 NOTE — Telephone Encounter (Signed)
done

## 2021-01-04 IMAGING — MR MR KNEE*R* W/O CM
4 of 6 series · 18 of 40 positions shown · non-contrast
Comparison: March 31, 2019 radiograph

CLINICAL DATA: Right knee pain

EXAM:
MRI OF THE RIGHT KNEE WITHOUT CONTRAST
TECHNIQUE: Multiplanar, multisequence MR imaging of the knee was performed. No
intravenous contrast was administered.

[Series 3: T2 fat-sat · axial · 4.0mm · 0.31mm/px · z∈[-34,+37]mm · 3 of 25 slices shown (1 of 2)]
[im 5/25]
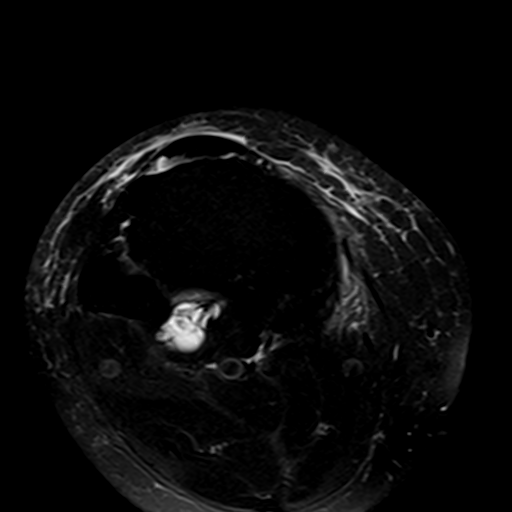
[im 13/25]
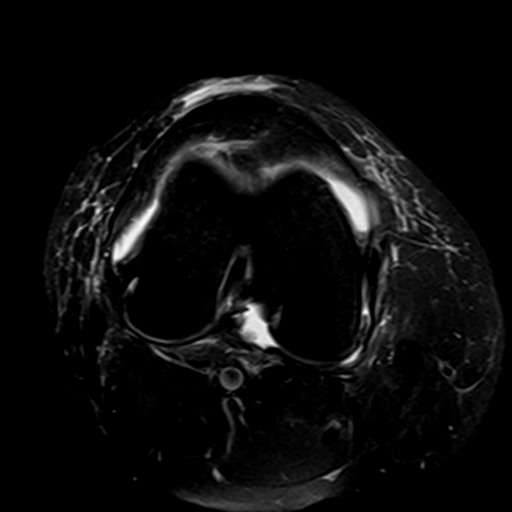
[im 21/25]
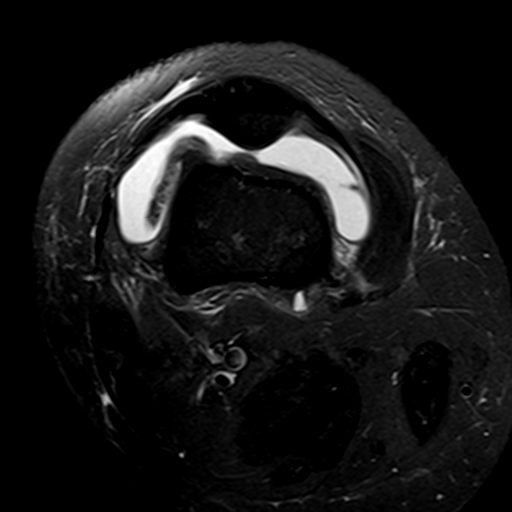

[Series 5: T2 fat-sat · coronal · 4.0mm · 0.29mm/px · 3 of 24 slices shown (2 of 2)]
[im 5/24]
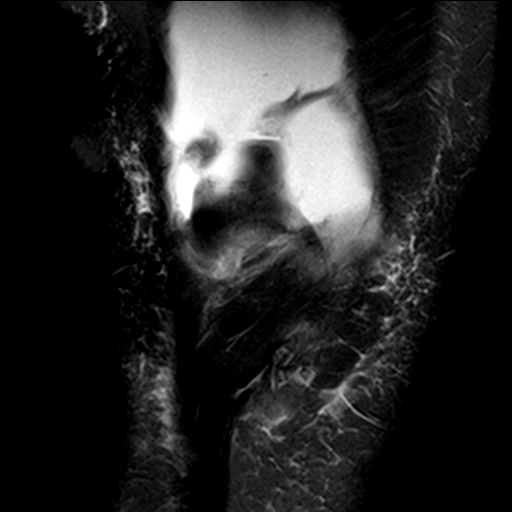
[im 14/24]
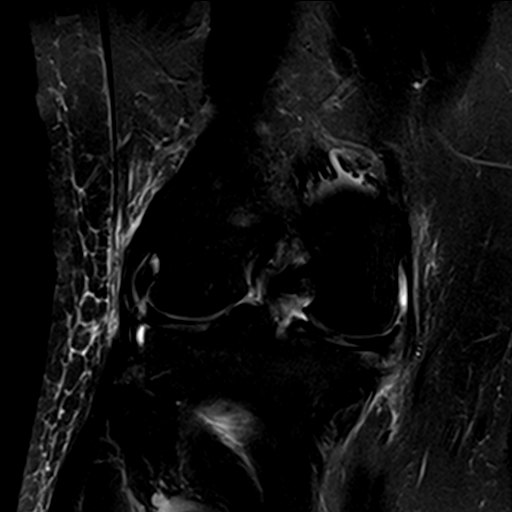
[im 24/24]
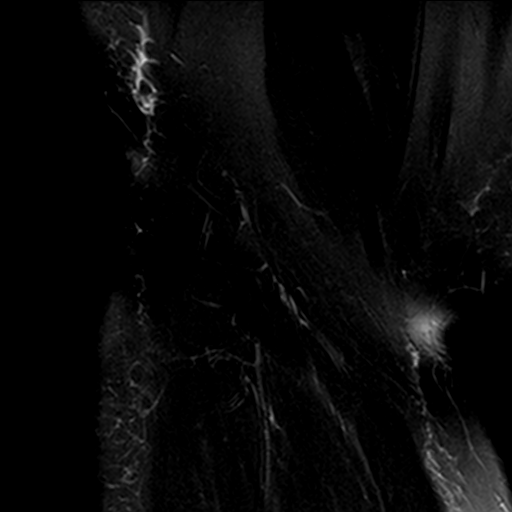

[Series 6: PD fat-sat · coronal · 3.0mm · 0.29mm/px · 7 of 30 slices shown (1 of 2)]
[im 1/30]
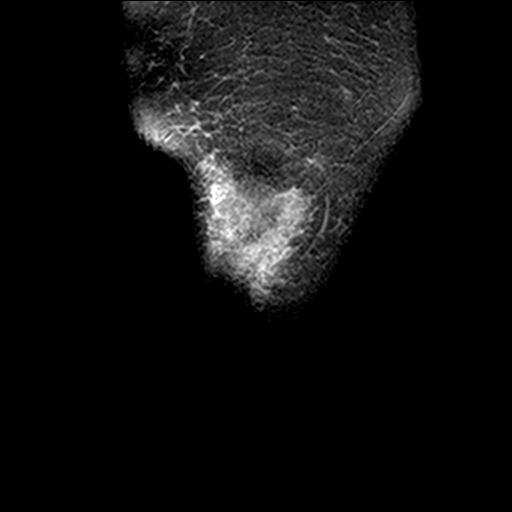
[im 5/30]
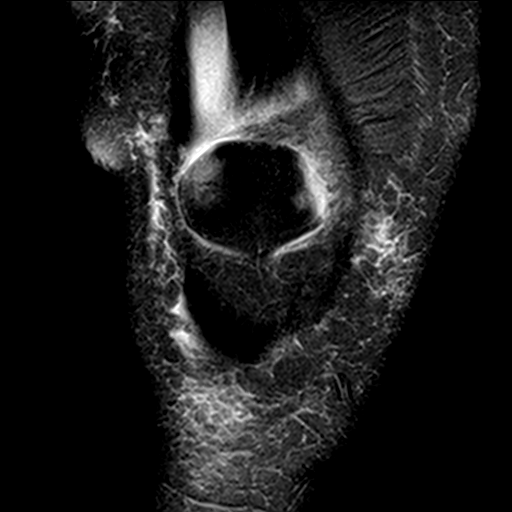
[im 10/30]
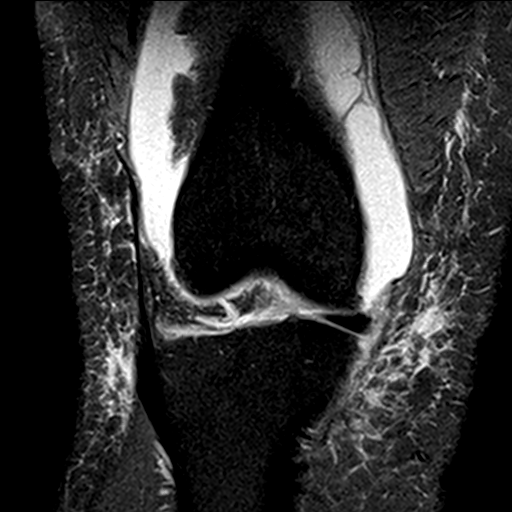
[im 15/30]
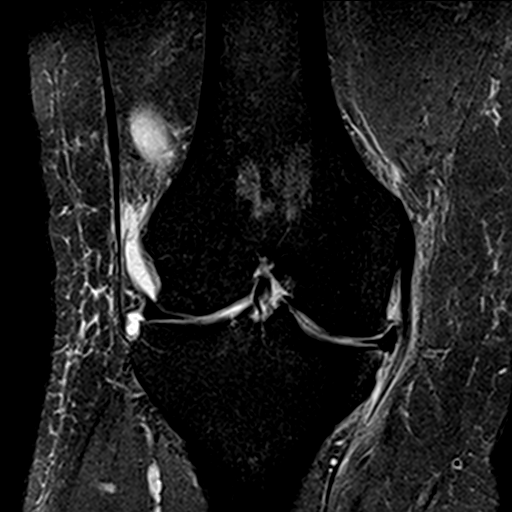
[im 20/30]
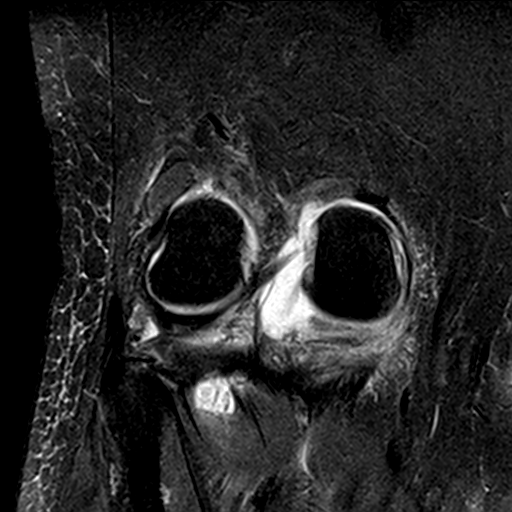
[im 25/30]
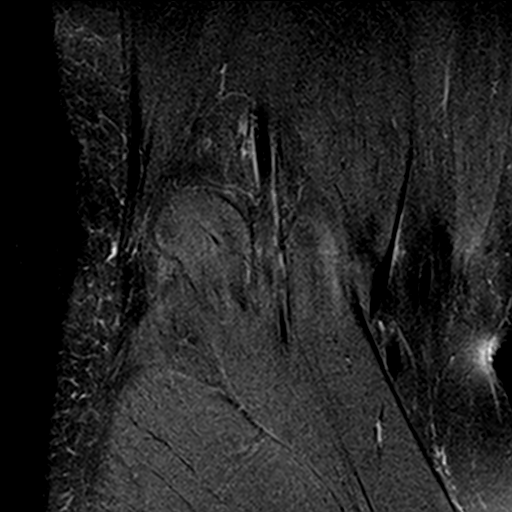
[im 30/30]
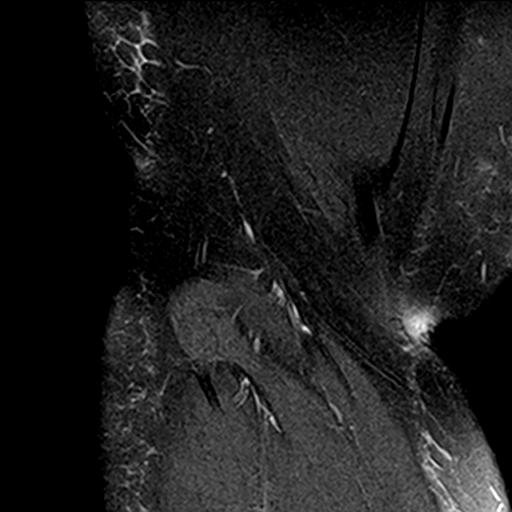

[Series 7: PD fat-sat · sagittal · 3.0mm · 0.29mm/px · 5 of 30 slices shown (2 of 2)]
[im 1/30]
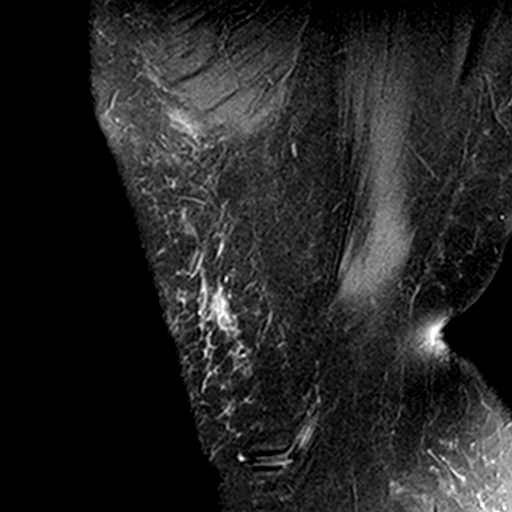
[im 5/30]
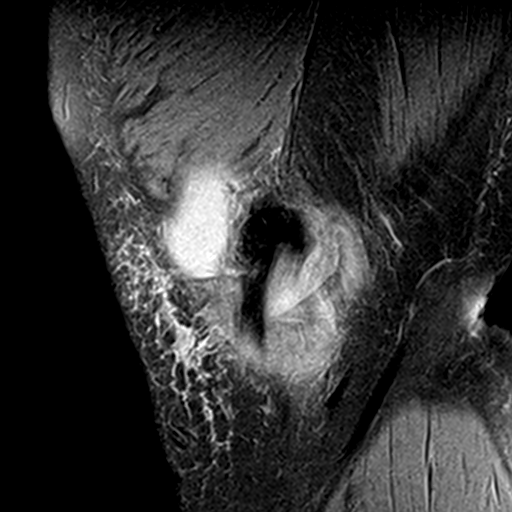
[im 10/30]
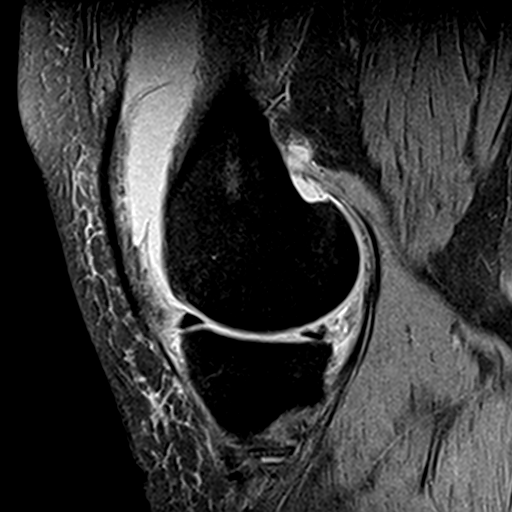
[im 15/30]
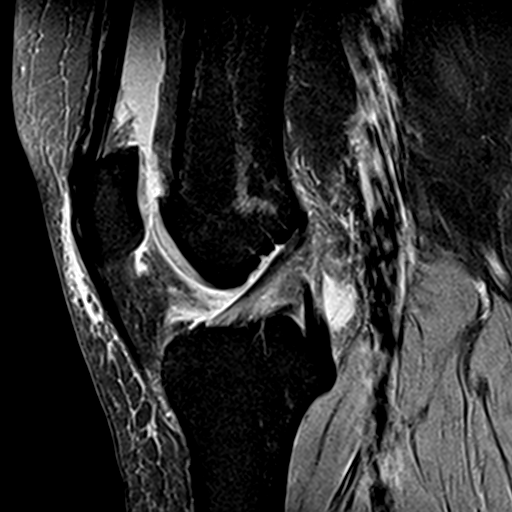
[im 25/30]
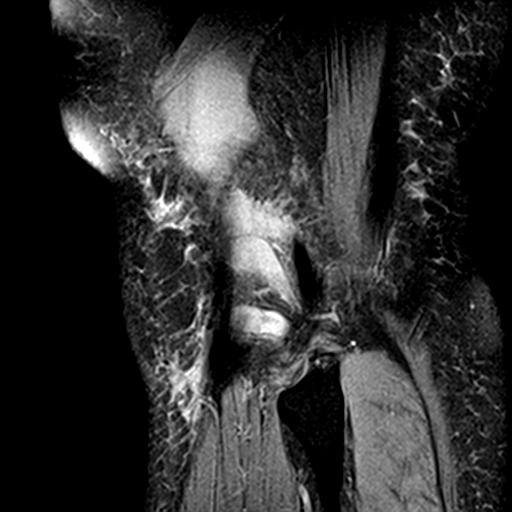

[18 of 40 positions shown; findings below may reference images not displayed]

FINDINGS: MENISCI

Medial: There is a complex horizontal longitudinal tear seen of the
posterior horn of the medial meniscus extending to the midbody. The
tear seen stir extend to the posterior horn root junction. There is
slight extrusion of the mid body. No displaced labral fragment is
seen.

Lateral: Intact.

LIGAMENTS

Cruciates: The ACL is intact. Increased intrasubstance signal seen
surrounding the PCL with a small loculated cystic collection is seen
surrounding the PCL which could represent a ganglion cyst.

Collaterals: Increased intrasubstance signal with a small around of
surrounding soft tissue edema seen surrounding the MCL. There is a
partial high-grade tear seen of the popliteus tendon at the
myotendinous junction with a focal fluid collection, series 3, image
19 with edema at the myotendinous junction of the popliteus. The
tendon insertion site appears to be intact however.

CARTILAGE

Patellofemoral: Mild chondral fissuring seen within the lateral
patellar facet.

Medial compartment: Chondral thinning and fissuring seen
weight-bearing surface of the medial femoral condyle and the tibial
plateau with underlying mild subchondral marrow signal change.

Lateral compartment: Mild chondral fissuring seen the weight-bearing
surface of the lateral femoral condyle.

BONES: No fracture. No avascular necrosis. No pathologic marrow
infiltration.

JOINT: A moderate knee joint effusion is seen with mild scattered
debris. Edema seen within Hoffa's fat pad. Mild prepatellar
subcutaneous edema is noted. No plical thickening.

EXTENSOR MECHANISM: Mildly increased intrasubstance signal seen at
the insertion site of the patellar and quadriceps tendon with small
enthesophytes. The retinaculum is unremarkable.

POPLITEAL FOSSA: No popliteal cyst.

OTHER:  Edema seen within the popliteus muscle belly.
IMPRESSION: 1. Complex tear of the posterior medial meniscus extending to the
posterior root junction with slight extrusion of the mid body. No
displaced meniscal fragment.
2. Intrasubstance degeneration of the PCL with a probable posterior
ganglion cyst.
3. High-grade partial tear of the popliteus tendon at the
myotendinous junction with muscle belly edema
4. Grade 1 medial collateral ligamentous sprain.
5. Tricompartmental osteoarthritis, mild-to-moderate within the
medial compartment
6. Moderate knee joint effusion with scattered debris
7. Mild insertional quadriceps and patellar tendinosis.

## 2021-01-15 DIAGNOSIS — I10 Essential (primary) hypertension: Secondary | ICD-10-CM | POA: Diagnosis not present

## 2021-01-15 DIAGNOSIS — A599 Trichomoniasis, unspecified: Secondary | ICD-10-CM | POA: Diagnosis not present

## 2021-01-15 DIAGNOSIS — N898 Other specified noninflammatory disorders of vagina: Secondary | ICD-10-CM | POA: Diagnosis not present

## 2021-01-24 ENCOUNTER — Ambulatory Visit: Payer: 59 | Attending: Internal Medicine

## 2021-01-24 ENCOUNTER — Other Ambulatory Visit (HOSPITAL_BASED_OUTPATIENT_CLINIC_OR_DEPARTMENT_OTHER): Payer: Self-pay

## 2021-01-24 DIAGNOSIS — Z23 Encounter for immunization: Secondary | ICD-10-CM

## 2021-01-24 MED ORDER — PFIZER COVID-19 VAC BIVALENT 30 MCG/0.3ML IM SUSP
INTRAMUSCULAR | 0 refills | Status: DC
  Filled 2021-01-24: qty 0.3, 1d supply, fill #0

## 2021-01-24 NOTE — Progress Notes (Signed)
° °  Covid-19 Vaccination Clinic  Name:  Christine Fields    MRN: [id number] DOB: 1980-07-22  01/24/2021  Ms. Pinedo was observed post Covid-19 immunization for 15 minutes without incident. She was provided with Vaccine Information Sheet and instruction to access the V-Safe system.   Ms. Sperl was instructed to call 911 with any severe reactions post vaccine: Difficulty breathing  Swelling of face and throat  A fast heartbeat  A bad rash all over body  Dizziness and weakness   Immunizations Administered     Name Date Dose VIS Date Route   Pfizer Covid-19 Vaccine Bivalent Booster 01/24/2021 12:29 PM 0.3 mL 09/11/2020 Intramuscular   Manufacturer: [employer], [employer]   Lot: [id number]   NDC: [phone]

## 2021-02-11 ENCOUNTER — Other Ambulatory Visit (HOSPITAL_COMMUNITY): Payer: Self-pay

## 2021-02-13 ENCOUNTER — Other Ambulatory Visit (HOSPITAL_COMMUNITY): Payer: Self-pay

## 2021-02-14 ENCOUNTER — Other Ambulatory Visit (HOSPITAL_COMMUNITY): Payer: Self-pay

## 2021-02-14 ENCOUNTER — Telehealth: Payer: Self-pay

## 2021-02-14 ENCOUNTER — Other Ambulatory Visit: Payer: Self-pay | Admitting: Medical

## 2021-02-14 MED ORDER — RYBELSUS 7 MG PO TABS
1.0000 | ORAL_TABLET | Freq: Every day | ORAL | 0 refills | Status: DC
  Filled 2021-02-14 – 2021-03-29 (×2): qty 90, 90d supply, fill #0

## 2021-02-14 NOTE — Telephone Encounter (Signed)
See telephone call regarding rybelsus

## 2021-02-14 NOTE — Telephone Encounter (Signed)
Pt got letter from [employer] that as of 04/12/21 insurance will no longer cover Rybelsus unless patient has diabetes and tried Metformin. I called [employer] and went over all weight loss meds Saxenda, Wegovy, Qsymia all require P.A.  But Says Ozempic is covered I asked if only for diabetes and she said no, said is $150 co pay for 30 days.  There is also discount card online that should bring it down to $25 a month.  And Phentermine is covered for $9 a month.   I called patient and she says she has tried Metformin and never wants to take it again.  She said is willing to try Ozempic. I advised needs appointment to discuss Ozempic with Vincenza Hews, this was scheduled for May. Please send in 90 days of Rybelsus so she can get 90 before insurance changes.

## 2021-03-11 ENCOUNTER — Other Ambulatory Visit (HOSPITAL_COMMUNITY): Payer: Self-pay

## 2021-03-11 ENCOUNTER — Telehealth: Payer: 59 | Admitting: Emergency Medicine

## 2021-03-11 DIAGNOSIS — J40 Bronchitis, not specified as acute or chronic: Secondary | ICD-10-CM

## 2021-03-11 MED ORDER — DOXYCYCLINE HYCLATE 100 MG PO TABS
100.0000 mg | ORAL_TABLET | Freq: Two times a day (BID) | ORAL | 0 refills | Status: DC
  Filled 2021-03-11: qty 14, 7d supply, fill #0

## 2021-03-11 MED ORDER — ALBUTEROL SULFATE HFA 108 (90 BASE) MCG/ACT IN AERS
2.0000 | INHALATION_SPRAY | Freq: Four times a day (QID) | RESPIRATORY_TRACT | 0 refills | Status: DC | PRN
  Filled 2021-03-11: qty 6.7, 25d supply, fill #0

## 2021-03-11 MED ORDER — SPACER/AERO-HOLDING CHAMBERS DEVI
1.0000 | 0 refills | Status: DC | PRN
  Filled 2021-03-11: qty 1, 1d supply, fill #0

## 2021-03-11 NOTE — Progress Notes (Signed)
We are sorry that you are not feeling well.  Here is how we plan to help!  Based on your presentation I believe you most likely have A cough due to bacteria.  When patients have a fever and a productive cough with a change in color or increased sputum production, we are concerned about bacterial bronchitis.  If left untreated it can progress to pneumonia.  If your symptoms do not improve with your treatment plan it is important that you contact your provider.   I have prescribed Doxycycline 100 mg twice a day for 7 days     In addition you may use A non-prescription cough medication called Mucinex DM: take 2 tablets every 12 hours.  I have also prescribed an albuterol inhaler with a spacer for you to use for shortness of breath or wheezing.   If this treatment doesn't relieve your symtpoms, you will need to have an in-person clinic visit such as at [facility] office or at an urgent care.   From your responses in the eVisit questionnaire you describe inflammation in the upper respiratory tract which is causing a significant cough.  This is commonly called Bronchitis and has four common causes:   Allergies Viral Infections Acid Reflux Bacterial Infection Allergies, viruses and acid reflux are treated by controlling symptoms or eliminating the cause. An example might be a cough caused by taking certain blood pressure medications. You stop the cough by changing the medication. Another example might be a cough caused by acid reflux. Controlling the reflux helps control the cough.  USE OF BRONCHODILATOR ("RESCUE") INHALERS: There is a risk from using your bronchodilator too frequently.  The risk is that over-reliance on a medication which only relaxes the muscles surrounding the breathing tubes can reduce the effectiveness of medications prescribed to reduce swelling and congestion of the tubes themselves.  Although you feel brief relief from the bronchodilator inhaler, your asthma may actually be  worsening with the tubes becoming more swollen and filled with mucus.  This can delay other crucial treatments, such as oral steroid medications. If you need to use a bronchodilator inhaler daily, several times per day, you should discuss this with your provider.  There are probably better treatments that could be used to keep your asthma under control.     HOME CARE Only take medications as instructed by your medical team. Complete the entire course of an antibiotic. Drink plenty of fluids and get plenty of rest. Avoid close contacts especially the very young and the elderly Cover your mouth if you cough or cough into your sleeve. Always remember to wash your hands A steam or ultrasonic humidifier can help congestion.   GET HELP RIGHT AWAY IF: You develop worsening fever. You become short of breath You cough up blood. Your symptoms persist after you have completed your treatment plan MAKE SURE YOU  Understand these instructions. Will watch your condition. Will get help right away if you are not doing well or get worse.    Thank you for choosing an e-visit.  Your e-visit answers were reviewed by a board certified advanced clinical practitioner to complete your personal care plan. Depending upon the condition, your plan could have included both over the counter or prescription medications.  Please review your pharmacy choice. Make sure the pharmacy is open so you can pick up prescription now. If there is a problem, you may contact your provider through [employer] and have the prescription routed to another pharmacy.  Your safety  is important to [place]. If you have drug allergies check your prescription carefully.   For the next 24 hours you can use MyChart to ask questions about today's visit, request a non-urgent call back, or ask for a work or school excuse. You will get an email in the next two days asking about your experience. I hope that your e-visit has been valuable and will  speed your recovery.  I have spent 5 minutes in review of e-visit questionnaire, review and updating patient chart, medical decision making and response to patient.   Rica Mast, PhD, FNP-BC

## 2021-03-31 ENCOUNTER — Other Ambulatory Visit (HOSPITAL_COMMUNITY): Payer: Self-pay

## 2021-04-01 ENCOUNTER — Other Ambulatory Visit (HOSPITAL_COMMUNITY): Payer: Self-pay

## 2021-04-01 MED ORDER — CARESTART COVID-19 HOME TEST VI KIT
PACK | 0 refills | Status: DC
  Filled 2021-04-01: qty 8, 8d supply, fill #0

## 2021-04-02 ENCOUNTER — Other Ambulatory Visit: Payer: Self-pay | Admitting: Medical

## 2021-04-02 ENCOUNTER — Other Ambulatory Visit (HOSPITAL_COMMUNITY): Payer: Self-pay

## 2021-04-03 ENCOUNTER — Other Ambulatory Visit (HOSPITAL_COMMUNITY): Payer: Self-pay

## 2021-04-03 MED ORDER — AMOXICILLIN 500 MG PO CAPS
500.0000 mg | ORAL_CAPSULE | Freq: Three times a day (TID) | ORAL | 0 refills | Status: DC
  Filled 2021-04-03: qty 21, 7d supply, fill #0

## 2021-04-03 MED ORDER — IBUPROFEN 800 MG PO TABS
800.0000 mg | ORAL_TABLET | Freq: Three times a day (TID) | ORAL | 0 refills | Status: DC | PRN
Start: 2021-04-03 — End: 2021-05-30
  Filled 2021-04-03: qty 21, 7d supply, fill #0

## 2021-04-03 MED ORDER — CHLORHEXIDINE GLUCONATE 0.12 % MT SOLN
15.0000 mL | Freq: Two times a day (BID) | OROMUCOSAL | 1 refills | Status: DC
  Filled 2021-04-03: qty 473, 16d supply, fill #0
  Filled 2021-07-18: qty 473, 16d supply, fill #1

## 2021-05-30 ENCOUNTER — Other Ambulatory Visit (HOSPITAL_COMMUNITY): Payer: Self-pay

## 2021-05-30 ENCOUNTER — Ambulatory Visit: Payer: 59 | Admitting: Medical

## 2021-05-30 VITALS — BP 122/80 | HR 65 | Temp 98.0°F | Wt 252.0 lb

## 2021-05-30 DIAGNOSIS — Z6838 Body mass index (BMI) 38.0-38.9, adult: Secondary | ICD-10-CM | POA: Diagnosis not present

## 2021-05-30 DIAGNOSIS — I10 Essential (primary) hypertension: Secondary | ICD-10-CM

## 2021-05-30 DIAGNOSIS — E559 Vitamin D deficiency, unspecified: Secondary | ICD-10-CM

## 2021-05-30 DIAGNOSIS — G43909 Migraine, unspecified, not intractable, without status migrainosus: Secondary | ICD-10-CM

## 2021-05-30 DIAGNOSIS — R7301 Impaired fasting glucose: Secondary | ICD-10-CM

## 2021-05-30 LAB — POCT GLYCOSYLATED HEMOGLOBIN (HGB A1C): Hemoglobin A1C: 5.7 % — AB (ref 4.0–5.6)

## 2021-05-30 MED ORDER — RIZATRIPTAN BENZOATE 10 MG PO TABS
10.0000 mg | ORAL_TABLET | ORAL | 2 refills | Status: DC | PRN
  Filled 2021-05-30: qty 10, 30d supply, fill #0
  Filled 2021-09-02: qty 10, 30d supply, fill #1
  Filled 2022-03-08: qty 10, 30d supply, fill #2

## 2021-05-30 MED ORDER — WEGOVY 0.25 MG/0.5ML ~~LOC~~ SOAJ
0.2500 mg | SUBCUTANEOUS | 0 refills | Status: DC
  Filled 2021-05-30 – 2022-01-15 (×6): qty 2, 28d supply, fill #0

## 2021-05-30 NOTE — Assessment & Plan Note (Signed)
Continue current therapy 

## 2021-05-30 NOTE — Progress Notes (Signed)
Subjective:  Christine Fields is a 41 y.o. female who presents for Chief Complaint  Patient presents with   other    Med check no other issues     Hypertension-compliant with medications without complaint.  Home blood pressures look normal but she does not check them very often.  No current symptoms of concern  Migraines-occasional migraine, no recent major issues with this.  She does need a refill on her abortive therapy  Impaired glucose-she has been doing fine on Rybelsus.  It really helps with cravings and appetite.  She has lost weight in recent months.  She is exercising.  Exercise - goes to gym, swims some, strength training, cardio.  Exercises 2 days per week, sometimes 3 days per week.  However insurance is no longer going to cover Rybelsus.  She did [facility] but had a lot of gastric problems with this and vows to never use metformin again.  No other aggravating or relieving factors.    No other c/o.  The following portions of the patient's history were reviewed and updated as appropriate: allergies, current medications, past family history, past medical history, past social history, past surgical history and problem list.  ROS Otherwise as in subjective above    Objective: BP 122/80   Pulse 65   Temp 98 F (36.7 C)   Wt 252 lb (114.3 kg)   BMI 38.89 kg/m   BP Readings from Last 3 Encounters:  05/30/21 122/80  11/12/20 120/70  04/17/20 (!) 150/106   Wt Readings from Last 3 Encounters:  05/30/21 252 lb (114.3 kg)  11/12/20 267 lb 3.2 oz (121.2 kg)  08/30/20 264 lb (119.7 kg)   General appearance: alert, no distress, well developed, well nourished Neck: supple, no lymphadenopathy, no thyromegaly, no masses Heart: RRR, normal S1, S2, no murmurs Lungs: CTA bilaterally, no wheezes, rhonchi, or rales Pulses: 2+ radial pulses, 2+ pedal pulses, normal cap refill Ext: no edema   Assessment: Encounter Diagnoses  Name  Primary?   Impaired fasting blood sugar Yes   Essential hypertension, benign    Vitamin D deficiency    BMI 38.0-38.9,adult    Migraine without status migrainosus, not intractable, unspecified migraine type      Plan: Problem List Items Addressed This Visit     Essential hypertension, benign    Blood pressure at goal.  Continue current medication       Migraine without status migrainosus, not intractable    Doing okay on current therapy.  No recent major issues with migraines       Relevant Medications   rizatriptan (MAXALT) 10 MG tablet   Impaired fasting blood sugar - Primary    Prior metformin gave her lower GI side effects.  Rybelsus was working well but insurance would not cover this and she is not diabetic.  We discussed that she is prediabetic or at risk for diabetes.  Trial of Wegovy since she really tolerated Rybelsus well.  Continue efforts at healthy diet and exercise       Relevant Orders   HgB A1c (Completed)   Vitamin D deficiency    Continue current therapy       BMI 38.0-38.9,adult   Relevant Orders   HgB A1c (Completed)   Arletta was seen today for other.  Diagnoses and all orders for this visit:  Impaired fasting blood sugar -     HgB A1c  Essential hypertension, benign  Vitamin D deficiency  BMI  38.0-38.9,adult -     HgB A1c  Migraine without status migrainosus, not intractable, unspecified migraine type  Other orders -     Semaglutide-Weight Management (WEGOVY) 0.25 MG/0.5ML SOAJ; Inject 0.25 mg into the skin once a week. -     rizatriptan (MAXALT) 10 MG tablet; Take 1 tablet (10 mg total) by mouth as needed for migraine, may repeat in 2 hours if needed     Follow up: 11/2021 for physical

## 2021-05-30 NOTE — Assessment & Plan Note (Signed)
Doing okay on current therapy.  No recent major issues with migraines

## 2021-05-30 NOTE — Assessment & Plan Note (Signed)
Blood pressure at goal.  Continue current medication

## 2021-05-30 NOTE — Assessment & Plan Note (Signed)
Prior metformin gave her lower GI side effects.  Rybelsus was working well but insurance would not cover this and she is not diabetic.  We discussed that she is prediabetic or at risk for diabetes.  Trial of Wegovy since she really tolerated Rybelsus well.  Continue efforts at healthy diet and exercise

## 2021-05-31 ENCOUNTER — Telehealth: Payer: Self-pay

## 2021-05-31 NOTE — Telephone Encounter (Signed)
P.A. WEGOVY 

## 2021-06-04 ENCOUNTER — Other Ambulatory Visit (HOSPITAL_COMMUNITY): Payer: Self-pay

## 2021-06-05 DIAGNOSIS — H5213 Myopia, bilateral: Secondary | ICD-10-CM | POA: Diagnosis not present

## 2021-06-05 DIAGNOSIS — H52223 Regular astigmatism, bilateral: Secondary | ICD-10-CM | POA: Diagnosis not present

## 2021-06-05 NOTE — Telephone Encounter (Signed)
Additional P.A. questions answered and faxed, pt informed

## 2021-06-11 ENCOUNTER — Other Ambulatory Visit (HOSPITAL_COMMUNITY): Payer: Self-pay

## 2021-06-14 NOTE — Telephone Encounter (Signed)
P.A. WEGOVY approved til 01/03/22, sent mychart message

## 2021-06-15 ENCOUNTER — Telehealth: Payer: 59 | Admitting: Nurse Practitioner

## 2021-06-15 DIAGNOSIS — H00012 Hordeolum externum right lower eyelid: Secondary | ICD-10-CM | POA: Diagnosis not present

## 2021-06-15 NOTE — Progress Notes (Signed)
  E-Visit for Stye   We are sorry that you are not feeling well. Here is how we plan to help!  Based on what you have shared with me it looks like you have a stye.  A stye is an inflammation of the eyelid.  It is often a red, painful lump near the edge of the eyelid that may look like a boil or a pimple.  A stye develops when an infection occurs at the base of an eyelash.   We have made appropriate suggestions for you based upon your presentation: Simple styes can be treated without medical intervention.  Most styes either resolve spontaneously or resolve with simple home treatment by applying warm compresses or heated washcloth to the stye for about 10-15 minutes three to four times a day. This causes the stye to drain and resolve. IT CAN TAKE UP TO 2 WEEKS FOR  STYE TO RESOLVE.  HOME CARE:  Wash your hands often! Let the stye open on its own. Don't squeeze or open it. Don't rub your eyes. This can irritate your eyes and let in bacteria.  If you need to touch your eyes, wash your hands first. Don't wear eye makeup or contact lenses until the area has healed.  GET HELP RIGHT AWAY IF:  Your symptoms do not improve. You develop blurred or loss of vision. Your symptoms worsen (increased discharge, pain or redness).   Thank you for choosing an e-visit.  Your e-visit answers were reviewed by a board certified advanced clinical practitioner to complete your personal care plan. Depending upon the condition, your plan could have included both over the counter or prescription medications.  Please review your pharmacy choice. Make sure the pharmacy is open so you can pick up prescription now. If there is a problem, you may contact your provider through [employer] and have the prescription routed to another pharmacy.  Your safety is important to [place]. If you have drug allergies check your prescription carefully.   For the next 24 hours you can use MyChart to ask questions about today's visit,  request a non-urgent call back, or ask for a work or school excuse. You will get an email in the next two days asking about your experience. I hope that your e-visit has been valuable and will speed your recovery.

## 2021-06-15 NOTE — Progress Notes (Signed)
I have spent 5 minutes in review of e-visit questionnaire, review and updating patient chart, medical decision making and response to patient.  ° °Najir Roop W Raynisha Avilla, NP ° °  °

## 2021-07-03 DIAGNOSIS — H00022 Hordeolum internum right lower eyelid: Secondary | ICD-10-CM | POA: Diagnosis not present

## 2021-07-16 DIAGNOSIS — R293 Abnormal posture: Secondary | ICD-10-CM | POA: Diagnosis not present

## 2021-07-16 DIAGNOSIS — M25561 Pain in right knee: Secondary | ICD-10-CM | POA: Diagnosis not present

## 2021-07-16 DIAGNOSIS — M9901 Segmental and somatic dysfunction of cervical region: Secondary | ICD-10-CM | POA: Diagnosis not present

## 2021-07-16 DIAGNOSIS — M25512 Pain in left shoulder: Secondary | ICD-10-CM | POA: Diagnosis not present

## 2021-07-16 DIAGNOSIS — M9907 Segmental and somatic dysfunction of upper extremity: Secondary | ICD-10-CM | POA: Diagnosis not present

## 2021-07-16 DIAGNOSIS — M25511 Pain in right shoulder: Secondary | ICD-10-CM | POA: Diagnosis not present

## 2021-07-16 DIAGNOSIS — M9902 Segmental and somatic dysfunction of thoracic region: Secondary | ICD-10-CM | POA: Diagnosis not present

## 2021-07-16 DIAGNOSIS — G43009 Migraine without aura, not intractable, without status migrainosus: Secondary | ICD-10-CM | POA: Diagnosis not present

## 2021-07-16 DIAGNOSIS — M25572 Pain in left ankle and joints of left foot: Secondary | ICD-10-CM | POA: Diagnosis not present

## 2021-07-17 ENCOUNTER — Ambulatory Visit (INDEPENDENT_AMBULATORY_CARE_PROVIDER_SITE_OTHER): Payer: 59

## 2021-07-17 ENCOUNTER — Other Ambulatory Visit (HOSPITAL_COMMUNITY): Payer: Self-pay

## 2021-07-17 ENCOUNTER — Encounter: Payer: Self-pay | Admitting: Podiatry

## 2021-07-17 ENCOUNTER — Ambulatory Visit (INDEPENDENT_AMBULATORY_CARE_PROVIDER_SITE_OTHER): Payer: 59 | Admitting: Podiatry

## 2021-07-17 DIAGNOSIS — N761 Subacute and chronic vaginitis: Secondary | ICD-10-CM | POA: Insufficient documentation

## 2021-07-17 DIAGNOSIS — E785 Hyperlipidemia, unspecified: Secondary | ICD-10-CM | POA: Insufficient documentation

## 2021-07-17 DIAGNOSIS — M722 Plantar fascial fibromatosis: Secondary | ICD-10-CM | POA: Diagnosis not present

## 2021-07-17 DIAGNOSIS — R7303 Prediabetes: Secondary | ICD-10-CM | POA: Insufficient documentation

## 2021-07-17 MED ORDER — MELOXICAM 15 MG PO TABS
15.0000 mg | ORAL_TABLET | Freq: Every day | ORAL | 3 refills | Status: DC
  Filled 2021-07-17: qty 30, 30d supply, fill #0

## 2021-07-17 MED ORDER — TRIAMCINOLONE ACETONIDE 40 MG/ML IJ SUSP
20.0000 mg | Freq: Once | INTRAMUSCULAR | Status: AC
  Administered 2021-07-17: 20 mg

## 2021-07-17 MED ORDER — METHYLPREDNISOLONE 4 MG PO TBPK
ORAL_TABLET | ORAL | 0 refills | Status: DC
  Filled 2021-07-17: qty 21, 6d supply, fill #0

## 2021-07-17 NOTE — Progress Notes (Signed)
Subjective:  Patient ID: Christine Fields, female    DOB: 22-Apr-1980,  MRN: [id number] HPI Chief Complaint  Patient presents with   Foot Pain    Plantar heel left - aching x 6 months+, AM pain, tried no heels, stretching, ice, OTC insoles-no help   New Patient (Initial Visit)    40 y.o. female presents with the above complaint.   ROS: Denies fever chills nausea vomit muscle aches pains calf pain back pain chest pain shortness of breath.  Past Medical History:  Diagnosis Date   Alkaline phosphatase elevation 2021   Hypertension    Migraine    Pre-diabetes    Vitamin D deficiency    Past Surgical History:  Procedure Laterality Date   CESAREAN SECTION     KNEE ARTHROSCOPY Right 07/13/2019   Procedure: RIGHT KNEE ARTHROSCOPY PARTIAL MEDIAL MENISCECTOMY MEDIAL CHONDROPLASTY;  Surgeon: Marcene Corning, MD;  Location: [facility];  Service: Orthopedics;  Laterality: Right;    Current Outpatient Medications:    meloxicam (MOBIC) 15 MG tablet, Take 1 tablet (15 mg total) by mouth daily., Disp: 30 tablet, Rfl: 3   methylPREDNISolone (MEDROL DOSEPAK) 4 MG TBPK tablet, Take as directed., Disp: 21 tablet, Rfl: 0   albuterol (VENTOLIN HFA) 108 (90 Base) MCG/ACT inhaler, Inhale 2 puffs into the lungs every 6 (six) hours as needed for wheezing or shortness of breath. (Patient not taking: Reported on 05/30/2021), Disp: 6.7 g, Rfl: 0   bisoprolol-hydrochlorothiazide (ZIAC) 10-6.25 MG tablet, Take 1 tablet by mouth daily., Disp: 90 tablet, Rfl: 3   chlorhexidine (PERIDEX) 0.12 % solution, Rinse and spit with 15 mLs for 30 seconds 2 (two) times daily., Disp: 473 mL, Rfl: 1   cholecalciferol (VITAMIN D3) 25 MCG (1000 UNIT) tablet, Take 2 tablets (2,000 Units total) by mouth daily., Disp: 180 tablet, Rfl: 3   levonorgestrel (MIRENA, 52 MG,) 20 MCG/24HR IUD, , Disp: , Rfl:    rizatriptan (MAXALT) 10 MG tablet, Take 1 tablet (10 mg total) by mouth as needed for migraine, may repeat in 2  hours if needed, Disp: 10 tablet, Rfl: 2   Semaglutide-Weight Management (WEGOVY) 0.25 MG/0.5ML SOAJ, Inject 0.25 mg into the skin once a week., Disp: 2 mL, Rfl: 0   Spacer/Aero-Holding Chambers DEVI, 1 each by Does not apply route as needed., Disp: 1 each, Rfl: 0   valsartan (DIOVAN) 80 MG tablet, Take 1 tablet (80 mg total) by mouth daily., Disp: 90 tablet, Rfl: 3  Allergies  Allergen Reactions   Amlodipine     Headache    Kiwi Extract     Tongue and mouth burning/irritation   Review of Systems Objective:  There were no vitals filed for this visit.  General: Well developed, nourished, in no acute distress, alert and oriented x3   Dermatological: Skin is warm, dry and supple bilateral. Nails x 10 are well maintained; remaining integument appears unremarkable at this time. There are no open sores, no preulcerative lesions, no rash or signs of infection present.  Vascular: Dorsalis Pedis artery and Posterior Tibial artery pedal pulses are 2/4 bilateral with immedate capillary fill time. Pedal hair growth present. No varicosities and no lower extremity edema present bilateral.   Neruologic: Grossly intact via light touch bilateral. Vibratory intact via tuning fork bilateral. Protective threshold with Semmes Wienstein monofilament intact to all pedal sites bilateral. Patellar and Achilles deep tendon reflexes 2+ bilateral. No Babinski or clonus noted bilateral.   Musculoskeletal: No gross boney pedal deformities bilateral. No pain,  crepitus, or limitation noted with foot and ankle range of motion bilateral. Muscular strength 5/5 in all groups tested bilateral.  She has pain on palpation medial calcaneal tubercle of the left heel with flexible pes planovalgus.  Gait: Unassisted, Nonantalgic.    Radiographs:  Radiographs taken today demonstrate mild pes planovalgus of the left foot plantar distally oriented calcaneal heel spur soft tissue increase in density at the plantar fascial  calcaneal insertion site indicative of Planter fasciitis.  No acute findings.    Assessment & Plan:   Assessment: Pes planovalgus with Planter fasciitis left foot.  Plan: Discussed etiology pathology conservative versus surgical therapies discussed appropriate shoe gear stretching exercise ice therapy and sugar modifications.  Injected her left heel today 20 mg Kenalog 5 mg Marcaine point of maximal tenderness of the left heel.  We also started her on methylprednisolone to be followed by meloxicam placed her in a plantar fascial brace to be followed by a night splint.  I will follow-up with her in 1 month.     Christine Fields T. [place], [place]

## 2021-07-18 ENCOUNTER — Other Ambulatory Visit (HOSPITAL_COMMUNITY): Payer: Self-pay

## 2021-07-21 ENCOUNTER — Other Ambulatory Visit (HOSPITAL_COMMUNITY): Payer: Self-pay

## 2021-07-21 MED ORDER — CHLORHEXIDINE GLUCONATE 0.12 % MT SOLN
Freq: Two times a day (BID) | OROMUCOSAL | 2 refills | Status: DC
  Filled 2021-12-15: qty 473, 15d supply, fill #0

## 2021-07-24 ENCOUNTER — Other Ambulatory Visit (HOSPITAL_COMMUNITY): Payer: Self-pay

## 2021-07-24 DIAGNOSIS — H00022 Hordeolum internum right lower eyelid: Secondary | ICD-10-CM | POA: Diagnosis not present

## 2021-07-24 MED ORDER — OFLOXACIN 0.3 % OP SOLN
1.0000 [drp] | Freq: Four times a day (QID) | OPHTHALMIC | 0 refills | Status: DC
  Filled 2021-07-24: qty 10, 50d supply, fill #0

## 2021-08-20 ENCOUNTER — Encounter (INDEPENDENT_AMBULATORY_CARE_PROVIDER_SITE_OTHER): Payer: Self-pay

## 2021-08-26 ENCOUNTER — Ambulatory Visit: Payer: 59 | Admitting: Podiatry

## 2021-08-27 DIAGNOSIS — H00022 Hordeolum internum right lower eyelid: Secondary | ICD-10-CM | POA: Diagnosis not present

## 2021-09-02 ENCOUNTER — Other Ambulatory Visit (HOSPITAL_COMMUNITY): Payer: Self-pay

## 2021-09-02 MED ORDER — FLUCONAZOLE 150 MG PO TABS
150.0000 mg | ORAL_TABLET | ORAL | 0 refills | Status: DC
  Filled 2021-09-02: qty 2, 3d supply, fill #0

## 2021-09-03 ENCOUNTER — Other Ambulatory Visit (HOSPITAL_COMMUNITY): Payer: Self-pay

## 2021-09-17 ENCOUNTER — Encounter: Payer: Self-pay | Admitting: Internal Medicine

## 2021-10-16 DIAGNOSIS — Z975 Presence of (intrauterine) contraceptive device: Secondary | ICD-10-CM | POA: Diagnosis not present

## 2021-10-16 DIAGNOSIS — Z124 Encounter for screening for malignant neoplasm of cervix: Secondary | ICD-10-CM | POA: Diagnosis not present

## 2021-10-16 DIAGNOSIS — Z01419 Encounter for gynecological examination (general) (routine) without abnormal findings: Secondary | ICD-10-CM | POA: Diagnosis not present

## 2021-10-20 DIAGNOSIS — Z1231 Encounter for screening mammogram for malignant neoplasm of breast: Secondary | ICD-10-CM | POA: Diagnosis not present

## 2021-10-20 LAB — HM MAMMOGRAPHY

## 2021-10-21 LAB — HM PAP SMEAR: HPV, high-risk: NEGATIVE

## 2021-10-21 LAB — RESULTS CONSOLE HPV: CHL HPV: NEGATIVE

## 2021-10-24 ENCOUNTER — Other Ambulatory Visit (HOSPITAL_COMMUNITY): Payer: Self-pay

## 2021-10-24 MED ORDER — INFLUENZA VAC SPLIT QUAD 0.5 ML IM SUSY
0.5000 mL | PREFILLED_SYRINGE | INTRAMUSCULAR | 0 refills | Status: DC
  Filled 2021-10-24: qty 0.5, 1d supply, fill #0

## 2021-11-11 ENCOUNTER — Encounter: Payer: 59 | Admitting: Medical

## 2021-11-13 ENCOUNTER — Encounter: Payer: 59 | Admitting: Medical

## 2021-12-16 ENCOUNTER — Other Ambulatory Visit (HOSPITAL_COMMUNITY): Payer: Self-pay

## 2022-01-09 ENCOUNTER — Other Ambulatory Visit (HOSPITAL_COMMUNITY): Payer: Self-pay

## 2022-01-12 ENCOUNTER — Other Ambulatory Visit: Payer: Self-pay

## 2022-01-14 DIAGNOSIS — M25561 Pain in right knee: Secondary | ICD-10-CM | POA: Diagnosis not present

## 2022-01-15 ENCOUNTER — Other Ambulatory Visit (HOSPITAL_COMMUNITY): Payer: Self-pay

## 2022-01-16 ENCOUNTER — Other Ambulatory Visit (HOSPITAL_COMMUNITY): Payer: Self-pay

## 2022-01-23 ENCOUNTER — Telehealth: Payer: Self-pay | Admitting: Medical

## 2022-01-23 NOTE — Telephone Encounter (Signed)
P.A. WEGOVY,  pt has not been in,  need find out if pt has been on this medication and what current weight is? She has appt 01/28/22

## 2022-01-28 ENCOUNTER — Ambulatory Visit (INDEPENDENT_AMBULATORY_CARE_PROVIDER_SITE_OTHER): Payer: Commercial Managed Care - PPO | Admitting: Medical

## 2022-01-28 ENCOUNTER — Other Ambulatory Visit: Payer: Self-pay

## 2022-01-28 ENCOUNTER — Encounter: Payer: Self-pay | Admitting: Medical

## 2022-01-28 VITALS — BP 132/88 | HR 58 | Temp 98.3°F | Ht 66.75 in | Wt 261.6 lb

## 2022-01-28 DIAGNOSIS — Z0000 Encounter for general adult medical examination without abnormal findings: Secondary | ICD-10-CM | POA: Diagnosis not present

## 2022-01-28 DIAGNOSIS — R7301 Impaired fasting glucose: Secondary | ICD-10-CM | POA: Diagnosis not present

## 2022-01-28 DIAGNOSIS — Z6841 Body Mass Index (BMI) 40.0 and over, adult: Secondary | ICD-10-CM

## 2022-01-28 DIAGNOSIS — E559 Vitamin D deficiency, unspecified: Secondary | ICD-10-CM

## 2022-01-28 DIAGNOSIS — E785 Hyperlipidemia, unspecified: Secondary | ICD-10-CM | POA: Diagnosis not present

## 2022-01-28 DIAGNOSIS — R7303 Prediabetes: Secondary | ICD-10-CM

## 2022-01-28 DIAGNOSIS — I10 Essential (primary) hypertension: Secondary | ICD-10-CM

## 2022-01-28 DIAGNOSIS — Z975 Presence of (intrauterine) contraceptive device: Secondary | ICD-10-CM

## 2022-01-28 DIAGNOSIS — G43909 Migraine, unspecified, not intractable, without status migrainosus: Secondary | ICD-10-CM

## 2022-01-28 DIAGNOSIS — Z23 Encounter for immunization: Secondary | ICD-10-CM | POA: Diagnosis not present

## 2022-01-28 LAB — POCT URINALYSIS DIP (PROADVANTAGE DEVICE)
Bilirubin, UA: NEGATIVE
Blood, UA: NEGATIVE
Glucose, UA: NEGATIVE mg/dL
Ketones, POC UA: NEGATIVE mg/dL
Leukocytes, UA: NEGATIVE
Nitrite, UA: NEGATIVE
Protein Ur, POC: NEGATIVE mg/dL
Specific Gravity, Urine: 1.01
Urobilinogen, Ur: 0.2
pH, UA: 7.5 (ref 5.0–8.0)

## 2022-01-28 LAB — CBC
Hematocrit: 42.7 % (ref 34.0–46.6)
Hemoglobin: 14.1 g/dL (ref 11.1–15.9)
MCH: 28.3 pg (ref 26.6–33.0)
MCHC: 33 g/dL (ref 31.5–35.7)
MCV: 86 fL (ref 79–97)
Platelets: 219 10*3/uL (ref 150–450)
RBC: 4.98 x10E6/uL (ref 3.77–5.28)
RDW: 12.8 % (ref 11.7–15.4)
WBC: 7.7 10*3/uL (ref 3.4–10.8)

## 2022-01-28 LAB — LIPID PANEL
Chol/HDL Ratio: 5.4 ratio — ABNORMAL HIGH (ref 0.0–4.4)
Cholesterol, Total: 198 mg/dL (ref 100–199)
HDL: 37 mg/dL — ABNORMAL LOW (ref >39)
LDL Chol Calc (NIH): 145 mg/dL — ABNORMAL HIGH (ref 0–99)
Triglycerides: 86 mg/dL (ref 0–149)
VLDL Cholesterol Cal: 16 mg/dL (ref 5–40)

## 2022-01-28 LAB — COMPREHENSIVE METABOLIC PANEL
ALT: 16 IU/L (ref 0–32)
AST: 13 IU/L (ref 0–40)
Albumin/Globulin Ratio: 1.9 (ref 1.2–2.2)
Albumin: 4.5 g/dL (ref 3.9–4.9)
Alkaline Phosphatase: 62 IU/L (ref 44–121)
BUN/Creatinine Ratio: 12 (ref 9–23)
BUN: 10 mg/dL (ref 6–24)
Bilirubin Total: 0.9 mg/dL (ref 0.0–1.2)
CO2: 22 mmol/L (ref 20–29)
Calcium: 9.8 mg/dL (ref 8.7–10.2)
Chloride: 102 mmol/L (ref 96–106)
Creatinine, Ser: 0.82 mg/dL (ref 0.57–1.00)
Globulin, Total: 2.4 g/dL (ref 1.5–4.5)
Glucose: 109 mg/dL — ABNORMAL HIGH (ref 70–99)
Potassium: 4.1 mmol/L (ref 3.5–5.2)
Sodium: 139 mmol/L (ref 134–144)
Total Protein: 6.9 g/dL (ref 6.0–8.5)
eGFR: 91 mL/min/{1.73_m2} (ref >59)

## 2022-01-28 LAB — HEMOGLOBIN A1C
Est. average glucose Bld gHb Est-mCnc: 128 mg/dL
Hgb A1c MFr Bld: 6.1 % — ABNORMAL HIGH (ref 4.8–5.6)

## 2022-01-28 NOTE — Progress Notes (Signed)
Subjective:   HPI  Christine Fields is a 42 y.o. female who presents for Chief Complaint  Patient presents with   Annual Exam    CPE fasting labs, no other issues, never got wegovy and weight has increased    Patient Care Team: Analisia Kingsford, Leward Quan as PCP - General (Family Medicine) Sees dentist:[facility]) Sees eye doctor:Dr.Shapiro Dr. Melrose Nakayama, [facility] Dr. Jearld Lesch, weight management clinic Dr. Eston [facility]    Concerns: HTN - compliant with medications, no issues, no chest pain, no dyspnea, no edema.  Monday home BP was 120/88, gets normal readings in general.  Got braces recently  Doing pretty well.   No particular c/o.    Saw gynecology 10/2021, had updated mammo, pap, STD screen, normal results per patient  Migraines - no recent c/o  She has questions about HPV vaccine.  Reviewed their medical, surgical, family, social, medication, and allergy history and updated chart as appropriate.  Past Medical History:  Diagnosis Date   Alkaline phosphatase elevation 2021   Hypertension    Migraine    Pre-diabetes    Vitamin D deficiency     Family History  Problem Relation Age of Onset   Diabetes Father    Hypertension Father    Stroke Father    Hypertension Maternal Grandmother    Diabetes Maternal Grandfather    Cancer Maternal Grandfather      Current Outpatient Medications:    bisoprolol-hydrochlorothiazide (ZIAC) 10-6.25 MG tablet, Take 1 tablet by mouth daily., Disp: 90 tablet, Rfl: 3   cholecalciferol (VITAMIN D3) 25 MCG (1000 UNIT) tablet, Take 2 tablets (2,000 Units total) by mouth daily., Disp: 180 tablet, Rfl: 3   levonorgestrel (MIRENA, 52 MG,) 20 MCG/24HR IUD, , Disp: , Rfl:    rizatriptan (MAXALT) 10 MG tablet, Take 1 tablet (10 mg total) by mouth as needed for migraine, may repeat in 2 hours if needed, Disp: 10 tablet, Rfl: 2   Spacer/Aero-Holding Chambers DEVI, 1 each by Does not apply route as needed.,  Disp: 1 each, Rfl: 0   valsartan (DIOVAN) 80 MG tablet, Take 1 tablet (80 mg total) by mouth daily., Disp: 90 tablet, Rfl: 3   albuterol (VENTOLIN HFA) 108 (90 Base) MCG/ACT inhaler, Inhale 2 puffs into the lungs every 6 (six) hours as needed for wheezing or shortness of breath. (Patient not taking: Reported on 05/30/2021), Disp: 6.7 g, Rfl: 0   chlorhexidine (PERIDEX) 0.12 % solution, Rinse with 1/2 oz for 30 seconds and expectorate 2 (two) times daily. (Patient not taking: Reported on 01/28/2022), Disp: 473 mL, Rfl: 2   fluconazole (DIFLUCAN) 150 MG tablet, Take 1 tablet (150 mg total) by mouth once may repeat in 3 days if needed (Patient not taking: Reported on 01/28/2022), Disp: 2 tablet, Rfl: 0   influenza vac split quadrivalent PF (FLUARIX) 0.5 ML injection, Inject 0.5 mLs into the muscle. (Patient not taking: Reported on 01/28/2022), Disp: 0.5 mL, Rfl: 0   meloxicam (MOBIC) 15 MG tablet, Take 1 tablet (15 mg total) by mouth daily., Disp: 30 tablet, Rfl: 3   methylPREDNISolone (MEDROL DOSEPAK) 4 MG TBPK tablet, Take as directed. (Patient not taking: Reported on 01/28/2022), Disp: 21 tablet, Rfl: 0   Semaglutide-Weight Management (WEGOVY) 0.25 MG/0.5ML SOAJ, Inject 0.25 mg into the skin once a week. (Patient not taking: Reported on 01/28/2022), Disp: 2 mL, Rfl: 0  Allergies  Allergen Reactions   Amlodipine     Headache    Kiwi Extract  Tongue and mouth burning/irritation     Review of Systems Constitutional: -fever, -chills, -sweats, -unexpected weight change, -decreased appetite, -fatigue Allergy: -sneezing, -itching, -congestion Dermatology: -changing moles, --rash, -lumps ENT: -runny nose, -ear pain, -sore throat, -hoarseness, -sinus pain, -teeth pain, - ringing in ears, -hearing loss, -nosebleeds Cardiology: -chest pain, -palpitations, -swelling, -difficulty breathing when lying flat, -waking up short of breath Respiratory: -cough, -shortness of breath, -difficulty breathing with  exercise or exertion, -wheezing, -coughing up blood Gastroenterology: -abdominal pain, -nausea, -vomiting, -diarrhea, -constipation, -blood in stool, -changes in bowel movement, -difficulty swallowing or eating Hematology: -bleeding, -bruising  Musculoskeletal: -joint aches, -muscle aches, -joint swelling, -back pain, -neck pain, -cramping, -changes in gait Ophthalmology: denies vision changes, eye redness, itching, discharge Urology: -burning with urination, -difficulty urinating, -blood in urine, -urinary frequency, -urgency, -incontinence Neurology: -headache, -weakness, -tingling, -numbness, -memory loss, -falls, -dizziness Psychology: -depressed mood, -agitation, -sleep problems Breast/gyn: -breast tendnerss, -discharge, -lumps, -vaginal discharge,- irregular periods, -heavy periods     Objective:  BP (!) 134/90   Pulse (!) 58   Temp 98.3 F (36.8 C)   Ht 5' 6.75" (1.695 m)   Wt 261 lb 9.6 oz (118.7 kg)   BMI 41.28 kg/m  BP Readings from Last 3 Encounters:  01/28/22 (!) 134/90  05/30/21 122/80  11/12/20 120/70   Wt Readings from Last 3 Encounters:  01/28/22 261 lb 9.6 oz (118.7 kg)  05/30/21 252 lb (114.3 kg)  11/12/20 267 lb 3.2 oz (121.2 kg)    General appearance: alert, no distress, WD/WN, African American female Skin: unremarkable HEENT: normocephalic, conjunctiva/corneas normal, sclerae anicteric, PERRLA, EOMi Neck: supple, no lymphadenopathy, no thyromegaly, no masses, normal ROM, no bruits Chest: non tender, normal shape and expansion Heart: RRR, normal S1, S2, no murmurs Lungs: CTA bilaterally, no wheezes, rhonchi, or rales Abdomen: +bs, soft, non tender, non distended, no masses, no hepatomegaly, no splenomegaly, no bruits Back: non tender, normal ROM, no scoliosis Musculoskeletal: upper extremities non tender, no obvious deformity, normal ROM throughout, lower extremities non tender, no obvious deformity, normal ROM throughout Extremities: no edema, no  cyanosis, no clubbing Pulses: 2+ symmetric, upper and lower extremities, normal cap refill Neurological: alert, oriented x 3, CN2-12 intact, strength normal upper extremities and lower extremities, sensation normal throughout, DTRs 2+ throughout, no cerebellar signs, gait normal Psychiatric: normal affect, behavior normal, pleasant  Breast/gyn/rectal - deferred to gynecology   Assessment and Plan :   Encounter Diagnoses  Name Primary?   Encounter for health maintenance examination in adult Yes   Need for HPV vaccination    IUD (intrauterine device) in place    Essential hypertension, benign    Migraine without status migrainosus, not intractable, unspecified migraine type    Impaired fasting blood sugar    Dyslipidemia    Vitamin D deficiency    Prediabetes    BMI 40.0-44.9, adult [place])       Physical exam - discussed and counseled on healthy lifestyle, diet, exercise, preventative care, vaccinations, sick and well care, proper use of emergency dept and after hours care, and addressed their concerns.    Health screening: Advised they see their eye doctor yearly for routine vision care. Advised they see their dentist yearly for routine dental care including hygiene visits twice yearly. See your gynecologist yearly for routine gynecological care.  Cancer screening Counseled on self breast exams, mammograms, cervical cancer screening  Colonoscopy:  Advised age 52yo   Vaccinations: Immunization History  Administered Date(s) Administered   DTaP / HiB / IPV  04/28/1978, 06/28/1978, 09/30/1978, 09/20/1980   HPV 9-valent 01/28/2022   Hepatitis A, Adult 07/30/2010   Hepatitis B, adult 07/30/2010, 09/01/2010   Influenza Split 09/20/2008, 10/24/2009   Influenza,inj,Quad PF,6+ Mos 10/04/2019, 10/11/2020, 10/24/2021   MMR 06/16/1979, 05/02/2005, 02/22/2017   PFIZER(Purple Top)SARS-COV-2 Vaccination 01/27/2019, 02/17/2019   Pfizer Covid-19 Vaccine Bivalent Booster 30yrs & up  01/24/2021   Td 02/12/2002   Tdap 04/03/2014    Counseled on the Human Papilloma virus vaccine.  Vaccine information sheet given.  HPV vaccine given after consent obtained.  Patient was advised to return in 2 months for HPV #2, and in 6 months for HPV #3.      Separate significant issues discussed: Hypertension-continue current medication.    Vitamin D deficiency-restart OTC supplement  Continue efforts to lose weight through healthy diet measures and exercise. She requests referral to healthy weight and wellness.   She couldn't get wegovy last year due to general shortages of the medication  Impaired glucose-labs today  Dyslipidemia - recheck labs today, not currently on medication, consider coronary CT test as discussed.    Krithi was seen today for annual exam.  Diagnoses and all orders for this visit:  Encounter for health maintenance examination in adult -     Comprehensive metabolic panel -     CBC -     Lipid panel -     Hemoglobin A1c -     POCT Urinalysis DIP (Proadvantage Device)  Need for HPV vaccination -     HPV 9-valent vaccine,Recombinat  IUD (intrauterine device) in place  Essential hypertension, benign  Migraine without status migrainosus, not intractable, unspecified migraine type  Impaired fasting blood sugar -     Hemoglobin A1c  Dyslipidemia -     Lipid panel  Vitamin D deficiency  Prediabetes -     Hemoglobin A1c  BMI 40.0-44.9, adult (HCC)   Follow-up pending labs, yearly for physical

## 2022-01-29 ENCOUNTER — Other Ambulatory Visit: Payer: Self-pay | Admitting: Medical

## 2022-01-29 ENCOUNTER — Other Ambulatory Visit (HOSPITAL_COMMUNITY): Payer: Self-pay

## 2022-01-29 MED ORDER — BISOPROLOL-HYDROCHLOROTHIAZIDE 10-6.25 MG PO TABS
1.0000 | ORAL_TABLET | Freq: Every day | ORAL | 3 refills | Status: DC
  Filled 2022-01-29 (×2): qty 90, 90d supply, fill #0
  Filled 2022-07-14: qty 90, 90d supply, fill #1

## 2022-01-29 MED ORDER — VALSARTAN 80 MG PO TABS
80.0000 mg | ORAL_TABLET | Freq: Every day | ORAL | 3 refills | Status: DC
  Filled 2022-01-29 (×2): qty 90, 90d supply, fill #0
  Filled 2022-07-14: qty 90, 90d supply, fill #1

## 2022-01-29 MED ORDER — ALBUTEROL SULFATE HFA 108 (90 BASE) MCG/ACT IN AERS
2.0000 | INHALATION_SPRAY | Freq: Four times a day (QID) | RESPIRATORY_TRACT | 0 refills | Status: DC | PRN
  Filled 2022-01-29 (×2): qty 6.7, 25d supply, fill #0

## 2022-01-29 MED ORDER — WEGOVY 0.25 MG/0.5ML ~~LOC~~ SOAJ: 0.2500 mg | SUBCUTANEOUS | 0 refills | Status: DC

## 2022-01-29 MED ORDER — ROSUVASTATIN CALCIUM 5 MG PO TABS
5.0000 mg | ORAL_TABLET | ORAL | 1 refills | Status: DC
  Filled 2022-01-29 (×2): qty 45, 90d supply, fill #0
  Filled 2022-08-28: qty 45, 90d supply, fill #1
  Filled 2022-12-01: qty 45, 90d supply, fill #2

## 2022-01-29 NOTE — Progress Notes (Signed)
Results sent through MyChart

## 2022-01-30 ENCOUNTER — Other Ambulatory Visit (HOSPITAL_COMMUNITY): Payer: Self-pay

## 2022-01-30 DIAGNOSIS — F4310 Post-traumatic stress disorder, unspecified: Secondary | ICD-10-CM | POA: Diagnosis not present

## 2022-02-07 NOTE — Telephone Encounter (Signed)
P.A. now completed after appt, pt never started [facility] due to shortage, if approved will offer [facility]

## 2022-02-11 ENCOUNTER — Encounter (INDEPENDENT_AMBULATORY_CARE_PROVIDER_SITE_OTHER): Payer: Commercial Managed Care - PPO | Admitting: Family Medicine

## 2022-02-12 ENCOUNTER — Encounter: Payer: Self-pay | Admitting: Internal Medicine

## 2022-02-14 NOTE — Telephone Encounter (Signed)
P,A. Approved til 08/31/22, sent mychart message, will see if [facility] has in stock

## 2022-03-06 DIAGNOSIS — F4310 Post-traumatic stress disorder, unspecified: Secondary | ICD-10-CM | POA: Diagnosis not present

## 2022-03-11 ENCOUNTER — Other Ambulatory Visit: Payer: Self-pay | Admitting: Medical

## 2022-03-11 ENCOUNTER — Other Ambulatory Visit (HOSPITAL_COMMUNITY): Payer: Self-pay

## 2022-03-11 MED ORDER — WEGOVY 0.5 MG/0.5ML ~~LOC~~ SOAJ
0.5000 mg | SUBCUTANEOUS | 0 refills | Status: DC
  Filled 2022-03-11: qty 2, 28d supply, fill #0

## 2022-03-12 ENCOUNTER — Other Ambulatory Visit: Payer: Self-pay | Admitting: *Deleted

## 2022-03-12 MED ORDER — WEGOVY 0.5 MG/0.5ML ~~LOC~~ SOAJ: 0.5000 mg | SUBCUTANEOUS | 0 refills | Status: DC

## 2022-03-12 NOTE — Telephone Encounter (Signed)
Called [facility] & they couldn't process because was picked up at [facility] yesterday.  Called pt and she said they got in some yesterday and she picked up.

## 2022-03-13 ENCOUNTER — Other Ambulatory Visit (HOSPITAL_COMMUNITY): Payer: Self-pay

## 2022-03-27 DIAGNOSIS — F4310 Post-traumatic stress disorder, unspecified: Secondary | ICD-10-CM | POA: Diagnosis not present

## 2022-04-03 ENCOUNTER — Other Ambulatory Visit (HOSPITAL_COMMUNITY): Payer: Self-pay

## 2022-04-03 ENCOUNTER — Other Ambulatory Visit: Payer: Self-pay | Admitting: Internal Medicine

## 2022-04-03 MED ORDER — WEGOVY 0.5 MG/0.5ML ~~LOC~~ SOAJ
0.5000 mg | SUBCUTANEOUS | 0 refills | Status: DC
  Filled 2022-04-03: qty 2, 28d supply, fill #0

## 2022-04-03 NOTE — Telephone Encounter (Signed)
done

## 2022-04-03 NOTE — Telephone Encounter (Signed)
From: Bary Richard To: Office of Dorothea Ogle, [place] Sent: 04/03/2022 11:15 AM EDT Subject: Medication Renewal Request  Refills have been requested for the following medications:   Semaglutide-Weight Management (WEGOVY) 0.5 MG/0.5ML SOAJ [Shane Tysinger]  Preferred pharmacy: [facility] Delivery method: Arlyss Gandy

## 2022-04-05 ENCOUNTER — Other Ambulatory Visit (HOSPITAL_COMMUNITY): Payer: Self-pay

## 2022-04-07 ENCOUNTER — Other Ambulatory Visit (HOSPITAL_COMMUNITY): Payer: Self-pay

## 2022-04-17 DIAGNOSIS — F4310 Post-traumatic stress disorder, unspecified: Secondary | ICD-10-CM | POA: Diagnosis not present

## 2022-04-22 ENCOUNTER — Other Ambulatory Visit: Payer: Self-pay | Admitting: Medical

## 2022-04-22 DIAGNOSIS — I10 Essential (primary) hypertension: Secondary | ICD-10-CM

## 2022-04-22 DIAGNOSIS — E785 Hyperlipidemia, unspecified: Secondary | ICD-10-CM

## 2022-04-28 DIAGNOSIS — F4310 Post-traumatic stress disorder, unspecified: Secondary | ICD-10-CM | POA: Diagnosis not present

## 2022-05-07 ENCOUNTER — Other Ambulatory Visit: Payer: Self-pay | Admitting: Medical

## 2022-05-07 NOTE — Telephone Encounter (Signed)
Is this okay to refill? 

## 2022-05-08 ENCOUNTER — Other Ambulatory Visit (HOSPITAL_COMMUNITY): Payer: Self-pay

## 2022-05-08 ENCOUNTER — Other Ambulatory Visit: Payer: Self-pay | Admitting: Medical

## 2022-05-08 MED ORDER — WEGOVY 0.5 MG/0.5ML ~~LOC~~ SOAJ
0.5000 mg | SUBCUTANEOUS | 0 refills | Status: DC
  Filled 2022-05-08 (×2): qty 2, 28d supply, fill #0

## 2022-05-08 MED ORDER — RIZATRIPTAN BENZOATE 10 MG PO TABS
10.0000 mg | ORAL_TABLET | ORAL | 0 refills | Status: DC | PRN
  Filled 2022-05-08: qty 10, 30d supply, fill #0

## 2022-05-08 NOTE — Telephone Encounter (Signed)
Left message for patient to return my call.

## 2022-05-10 ENCOUNTER — Other Ambulatory Visit (HOSPITAL_COMMUNITY): Payer: Self-pay

## 2022-05-22 ENCOUNTER — Ambulatory Visit: Payer: Commercial Managed Care - PPO | Admitting: Medical

## 2022-05-22 VITALS — BP 130/72 | HR 68 | Wt 263.0 lb

## 2022-05-22 DIAGNOSIS — E559 Vitamin D deficiency, unspecified: Secondary | ICD-10-CM

## 2022-05-22 DIAGNOSIS — R7301 Impaired fasting glucose: Secondary | ICD-10-CM

## 2022-05-22 DIAGNOSIS — Z6841 Body Mass Index (BMI) 40.0 and over, adult: Secondary | ICD-10-CM

## 2022-05-22 DIAGNOSIS — I10 Essential (primary) hypertension: Secondary | ICD-10-CM

## 2022-05-22 DIAGNOSIS — F4310 Post-traumatic stress disorder, unspecified: Secondary | ICD-10-CM | POA: Diagnosis not present

## 2022-05-22 NOTE — Progress Notes (Signed)
Subjective:  Christine Fields is a 42 y.o. female who presents for Chief Complaint  Patient presents with   med check    Med check       Here for follow up on weight loss efforts  Current exercise: Roughly 3 days/week with aerobic exercise  Dietary efforts: Overall healthy but does drink some fruit juice.  No soda at all.  Feels like she is eating pretty healthy but needs some may be minor adjustments to her program  Medication: Currently on Wegovy for the past 2 months.  She is getting ready to go up to the next higher dose however insurance no longer covers this medication  Any side effects of medication: None  We did refer her to healthy weight and wellness clinic and she just had contact with them so we will be seeing them soon.  No other aggravating or relieving factors.    No other c/o.  Past Medical History:  Diagnosis Date   Alkaline phosphatase elevation 2021   Hypertension    Migraine    Pre-diabetes    Vitamin D deficiency     Current Outpatient Medications on File Prior to Visit  Medication Sig Dispense Refill   bisoprolol-hydrochlorothiazide (ZIAC) 10-6.25 MG tablet Take 1 tablet by mouth daily. 90 tablet 3   levonorgestrel (MIRENA, 52 MG,) 20 MCG/24HR IUD      rizatriptan (MAXALT) 10 MG tablet Take 1 tablet (10 mg total) by mouth as needed for migraine, may repeat in 2 hours if needed 10 tablet 0   rosuvastatin (CRESTOR) 5 MG tablet Take 1 tablet (5 mg total) by mouth every other day. 90 tablet 1   Semaglutide-Weight Management (WEGOVY) 0.5 MG/0.5ML SOAJ Inject 0.5 mg into the skin once a week. 2 mL 0   valsartan (DIOVAN) 80 MG tablet Take 1 tablet (80 mg total) by mouth daily. 90 tablet 3   [DISCONTINUED] hydrochlorothiazide (HYDRODIURIL) 12.5 MG tablet Take 1 tablet (12.5 mg total) by mouth daily. 90 tablet 3   [DISCONTINUED] metoprolol tartrate (LOPRESSOR) 25 MG tablet Take 1 tablet (25 mg total) by mouth 2 (two) times daily. 1 tablet po BID for blood  pressure 180 tablet 3   No current facility-administered medications on file prior to visit.    The following portions of the patient's history were reviewed and updated as appropriate: allergies, current medications, past family history, past medical history, past social history, past surgical history and problem list.  ROS Otherwise as in subjective above   Objective: BP 130/72   Pulse 68   Wt 263 lb (119.3 kg)   BMI 41.50 kg/m   General appearance: alert, no distress, well developed, well nourished   Assessment: Encounter Diagnoses  Name Primary?   Essential hypertension, benign Yes   Impaired fasting blood sugar    Vitamin D deficiency    BMI 40.0-44.9, adult (HCC)      Plan: We discussed her efforts for weight loss.  She will continue to work on eating healthy.  She will try to increase her exercise.  Unfortunately insurance will no longer cover Wegovy.  She completed about 2 months of the medication.  At this point she will be establishing soon with the healthy weight and wellness clinic for other strategies.  Wt Readings from Last 3 Encounters:  05/22/22 263 lb (119.3 kg)  01/28/22 261 lb 9.6 oz (118.7 kg)  05/30/21 252 lb (114.3 kg)     [name] was seen today for med check.  Diagnoses and  all orders for this visit:  Essential hypertension, benign  Impaired fasting blood sugar  Vitamin D deficiency  BMI 40.0-44.9, adult (HCC)    Follow up: with healthy weight and wellness consult

## 2022-06-09 DIAGNOSIS — H5213 Myopia, bilateral: Secondary | ICD-10-CM | POA: Diagnosis not present

## 2022-06-10 ENCOUNTER — Encounter (HOSPITAL_BASED_OUTPATIENT_CLINIC_OR_DEPARTMENT_OTHER): Payer: Self-pay | Admitting: Cardiology

## 2022-06-10 ENCOUNTER — Ambulatory Visit (HOSPITAL_BASED_OUTPATIENT_CLINIC_OR_DEPARTMENT_OTHER): Payer: Commercial Managed Care - PPO | Admitting: Cardiology

## 2022-06-10 VITALS — BP 130/92 | HR 67 | Ht 66.75 in | Wt 264.4 lb

## 2022-06-10 DIAGNOSIS — Z8249 Family history of ischemic heart disease and other diseases of the circulatory system: Secondary | ICD-10-CM | POA: Diagnosis not present

## 2022-06-10 DIAGNOSIS — I10 Essential (primary) hypertension: Secondary | ICD-10-CM | POA: Diagnosis not present

## 2022-06-10 DIAGNOSIS — E7800 Pure hypercholesterolemia, unspecified: Secondary | ICD-10-CM

## 2022-06-10 DIAGNOSIS — Z7189 Other specified counseling: Secondary | ICD-10-CM | POA: Diagnosis not present

## 2022-06-10 DIAGNOSIS — Z6841 Body Mass Index (BMI) 40.0 and over, adult: Secondary | ICD-10-CM | POA: Diagnosis not present

## 2022-06-10 NOTE — Progress Notes (Signed)
Cardiology Office Note:    Date:  06/10/2022   ID:  Christine Fields, DOB 12/18/1980, MRN [id number]  PCP:  Jac Canavan, PA-C  Cardiologist:  Jodelle Red, MD  Referring MD: Jac Canavan, PA-C   CC: new patient consultation for hypertension  History of Present Illness:    Christine Fields is a 42 y.o. female with a hx of hypertension, obesity who is seen as a new consult at the request of Tysinger, Kermit Balo, PA-C for the evaluation and management of hypertension.  Note from 05/22/22 from Jac Canavan reviewed. Referred to health weight and wellness for weight loss, on Wegovy (but was no longer covered by insurance). Referred to cardiology for hypertension.  Cardiovascular risk factors: Prior clinical ASCVD:  none Comorbid conditions: hypertension, hyperlipidemia. Denies diabetes, chronic kidney disease . Prediabetic, A1c 6.1.  Metabolic syndrome/Obesity: BMI 42, highest adult weight 272 lbs. Went down to 215 lbs in 2018. Had stressful life, regained weight.  Chronic inflammatory conditions: none Tobacco use history: never Family history: maternal gma died of MI age 68, father has had CVA and MI (versus two CVAs). Lots of hypertension throughout her family.   Was told her LDL was elevated recently, recommended to start rosuvastatin 5 mg every other day and see cardiology. On Wegovy.  Discussed diet, exercise recommendations at length today.  Denies chest pain, shortness of breath at rest or with normal exertion. No PND, orthopnea, LE edema or unexpected weight gain. No syncope or palpitations.   Past Medical History:  Diagnosis Date   Alkaline phosphatase elevation 2021   Hypertension    Migraine    Pre-diabetes    Vitamin D deficiency     Past Surgical History:  Procedure Laterality Date   CESAREAN SECTION     KNEE ARTHROSCOPY Right 07/13/2019   Procedure: RIGHT KNEE ARTHROSCOPY PARTIAL MEDIAL MENISCECTOMY MEDIAL CHONDROPLASTY;  Surgeon: Marcene Corning, MD;  Location: [facility];  Service: Orthopedics;  Laterality: Right;    Current Medications: Current Outpatient Medications on File Prior to Visit  Medication Sig   bisoprolol-hydrochlorothiazide (ZIAC) 10-6.25 MG tablet Take 1 tablet by mouth daily.   levonorgestrel (MIRENA, 52 MG,) 20 MCG/24HR IUD    rizatriptan (MAXALT) 10 MG tablet Take 1 tablet (10 mg total) by mouth as needed for migraine, may repeat in 2 hours if needed   rosuvastatin (CRESTOR) 5 MG tablet Take 1 tablet (5 mg total) by mouth every other day.   Semaglutide-Weight Management (WEGOVY) 0.5 MG/0.5ML SOAJ Inject 0.5 mg into the skin once a week.   valsartan (DIOVAN) 80 MG tablet Take 1 tablet (80 mg total) by mouth daily.   No current facility-administered medications on file prior to visit.     Allergies:   Amlodipine and Kiwi extract   Social History   Tobacco Use   Smoking status: Never   Smokeless tobacco: Never  Vaping Use   Vaping Use: Never used  Substance Use Topics   Alcohol use: Yes    Alcohol/week: 1.0 standard drink of alcohol    Types: 1 Glasses of wine per week    Comment: occasionally   Drug use: No    Family History: family history includes Cancer in her maternal grandfather; Diabetes in her father and maternal grandfather; Hypertension in her father and maternal grandmother; Stroke in her father.  ROS:   Please see the history of present illness.  Additional pertinent ROS: Constitutional: Negative for chills, fever, night sweats, unintentional weight loss  HENT: Negative for ear pain and hearing loss.   Eyes: Negative for loss of vision and eye pain.  Respiratory: Negative for cough, sputum, wheezing.   Cardiovascular: See HPI. Gastrointestinal: Negative for abdominal pain, melena, and hematochezia.  Genitourinary: Negative for dysuria and hematuria.  Musculoskeletal: Negative for falls and myalgias.  Skin: Negative for itching and rash.  Neurological: Negative  for focal weakness, focal sensory changes and loss of consciousness.  Endo/Heme/Allergies: Does not bruise/bleed easily.     EKGs/Labs/Other Studies Reviewed:    The following studies were reviewed today: None  EKG:  EKG is personally reviewed.   06/10/22: NSR at 67 bpm, nonspecific t wave pattern  Recent Labs: 01/28/2022: ALT 16; BUN 10; Creatinine, Ser 0.82; Hemoglobin 14.1; Platelets 219; Potassium 4.1; Sodium 139  Recent Lipid Panel    Component Value Date/Time   CHOL 198 01/28/2022 1043   TRIG 86 01/28/2022 1043   HDL 37 (L) 01/28/2022 1043   CHOLHDL 5.4 (H) 01/28/2022 1043   LDLCALC 145 (H) 01/28/2022 1043   LDLDIRECT 103 (H) 11/10/2019 1519    Physical Exam:    VS:  BP (!) 130/92   Pulse 67   Ht 5' 6.75" (1.695 m)   Wt 264 lb 6.4 oz (119.9 kg)   BMI 41.72 kg/m     Wt Readings from Last 3 Encounters:  06/10/22 264 lb 6.4 oz (119.9 kg)  05/22/22 263 lb (119.3 kg)  01/28/22 261 lb 9.6 oz (118.7 kg)    GEN: Well nourished, well developed in no acute distress HEENT: Normal, moist mucous membranes NECK: No JVD CARDIAC: regular rhythm, normal S1 and S2, no rubs or gallops. No murmur. VASCULAR: Radial and DP pulses 2+ bilaterally. No carotid bruits RESPIRATORY:  Clear to auscultation without rales, wheezing or rhonchi  ABDOMEN: Soft, non-tender, non-distended MUSCULOSKELETAL:  Ambulates independently SKIN: Warm and dry, no edema NEUROLOGIC:  Alert and oriented x 3. No focal neuro deficits noted. PSYCHIATRIC:  Normal affect    ASSESSMENT:    1. Essential hypertension, benign   2. Pure hypercholesterolemia   3. Class 3 severe obesity due to excess calories without serious comorbidity with body mass index (BMI) of 40.0 to 44.9 in adult [facility])   4. Cardiac risk counseling   5. Counseling on health promotion and disease prevention   6. Family history of heart disease    PLAN:    Hypertension -improved on recheck -discussed impact of lifestyle on blood  pressure -for now, continue bisoprolol-hydrochlorothiazide. She is on a low dose of this. Overall beta blockers are not my first choice for hypertension but she is tolerating. If needed, would go up on thiazide and/or replace bisoprolol with amlodipine if additional agent needed  Hypercholesterolemia Class 3 obesity Family history of heart disease -we discussed metabolic syndrome at length. Unfortunately, GLP1ra are no longer covered for weight loss on employee plan, but if they become an option for her again, this is a good choice with the cardiovascular benefit  Cardiac risk counseling and prevention recommendations: -recommend heart healthy/Mediterranean diet, with whole grains, fruits, vegetable, fish, lean meats, nuts, and olive oil. Limit salt. -recommend moderate walking, 3-5 times/week for 30-50 minutes each session. Aim for at least 150 minutes.week. Goal should be pace of 3 miles/hours, or walking 1.5 miles in 30 minutes -recommend avoidance of tobacco products. Avoid excess alcohol. -ASCVD risk score: The 10-year ASCVD risk score (Arnett DK, et al., 2019) is: 2.4%   Values used to calculate the score:  Age: 54 years     Sex: Female     Is Non-Hispanic African American: No     Diabetic: No     Tobacco smoker: No     Systolic Blood Pressure: 144 mmHg     Is BP treated: Yes     HDL Cholesterol: 37 mg/dL     Total Cholesterol: 198 mg/dL    Plan for follow up: 1 year or sooner as needed  Jodelle Red, MD, PhD, [facility]  [facility]  [place]  [facility] at [facility] at [facility] [address] [place], [place] [address] [phone]   Signed, Jodelle Red, MD PhD 06/10/2022 4:29 PM     [facility]

## 2022-06-10 NOTE — Patient Instructions (Addendum)
Medication Instructions:  Your physician recommends that you continue on your current medications as directed. Please refer to the Current Medication list given to you today.  *If you need a refill on your cardiac medications before your next appointment, please call your pharmacy*  Lab Work: NONE  Testing/Procedures: NONE  Follow-Up: At [facility], you and your health needs are our priority.  As part of our continuing mission to provide you with exceptional heart care, we have created designated Provider Care Teams.  These Care Teams include your primary Cardiologist (physician) and Advanced Practice Providers (APPs -  Physician Assistants and Nurse Practitioners) who all work together to provide you with the care you need, when you need it.  We recommend signing up for the patient portal called "MyChart".  Sign up information is provided on this After Visit Summary.  MyChart is used to connect with patients for Virtual Visits (Telemedicine).  Patients are able to view lab/test results, encounter notes, upcoming appointments, etc.  Non-urgent messages can be sent to your provider as well.   To learn more about what you can do with MyChart, go to [email or web address].    Your next appointment:   12 month(s)  The format for your next appointment:   In Person  Provider:   Jodelle Red, MD   [email or web address]

## 2022-06-17 ENCOUNTER — Other Ambulatory Visit (HOSPITAL_COMMUNITY): Payer: Self-pay

## 2022-06-17 ENCOUNTER — Telehealth: Payer: Commercial Managed Care - PPO | Admitting: Nurse Practitioner

## 2022-06-17 DIAGNOSIS — J329 Chronic sinusitis, unspecified: Secondary | ICD-10-CM

## 2022-06-17 DIAGNOSIS — B9789 Other viral agents as the cause of diseases classified elsewhere: Secondary | ICD-10-CM | POA: Diagnosis not present

## 2022-06-17 MED ORDER — IPRATROPIUM BROMIDE 0.03 % NA SOLN
2.0000 | Freq: Two times a day (BID) | NASAL | 12 refills | Status: DC
  Filled 2022-06-17: qty 30, 43d supply, fill #0

## 2022-06-17 NOTE — Progress Notes (Signed)
E-Visit for Sinus Problems  We are sorry that you are not feeling well.  Here is how we plan to help!  Based on what you have shared with me it looks like you have sinusitis.  Sinusitis is inflammation and infection in the sinus cavities of the head.  Based on your presentation I believe you most likely have Acute Viral Sinusitis.This is an infection most likely caused by a virus. There is not specific treatment for viral sinusitis other than to help you with the symptoms until the infection runs its course.  You may use an oral decongestant such as Mucinex D or if you have glaucoma or high blood pressure use plain Mucinex. Saline nasal spray help and can safely be used as often as needed for congestion, I have prescribed: Ipratropium Bromide nasal spray 0.03% 2 sprays in eah nostril 2-3 times a day  We try our best to avoid over prescribing antibiotics. Typical viruses take 7 days to resolve, and we do not recommend antibiotics prior to 7-10 days of consistent symptoms.   Providers prescribe antibiotics to treat infections caused by bacteria. Antibiotics are very powerful in treating bacterial infections when they are used properly. To maintain their effectiveness, they should be used only when necessary. Overuse of antibiotics has resulted in the development of superbugs that are resistant to treatment!    After careful review of your answers, I would not recommend an antibiotic for your condition.  Antibiotics are not effective against viruses and therefore should not be used to treat them. Common examples of infections caused by viruses include colds and flu   Some authorities believe that zinc sprays or the use of Echinacea may shorten the course of your symptoms.  Sinus infections are not as easily transmitted as other respiratory infection, however we still recommend that you avoid close contact with loved ones, especially the very young and elderly.  Remember to wash your hands thoroughly  throughout the day as this is the number one way to prevent the spread of infection!  Home Care: Only take medications as instructed by your medical team. Do not take these medications with alcohol. A steam or ultrasonic humidifier can help congestion.  You can place a towel over your head and breathe in the steam from hot water coming from a faucet. Avoid close contacts especially the very young and the elderly. Cover your mouth when you cough or sneeze. Always remember to wash your hands.  Get Help Right Away If: You develop worsening fever or sinus pain. You develop a severe head ache or visual changes. Your symptoms persist after you have completed your treatment plan.  Make sure you Understand these instructions. Will watch your condition. Will get help right away if you are not doing well or get worse.   Thank you for choosing an e-visit.  Your e-visit answers were reviewed by a board certified advanced clinical practitioner to complete your personal care plan. Depending upon the condition, your plan could have included both over the counter or prescription medications.  Please review your pharmacy choice. Make sure the pharmacy is open so you can pick up prescription now. If there is a problem, you may contact your provider through [employer] and have the prescription routed to another pharmacy.  Your safety is important to [place]. If you have drug allergies check your prescription carefully.   For the next 24 hours you can use MyChart to ask questions about today's visit, request a non-urgent call back, or  ask for a work or school excuse. You will get an email in the next two days asking about your experience. I hope that your e-visit has been valuable and will speed your recovery.  Meds ordered this encounter  Medications   ipratropium (ATROVENT) 0.03 % nasal spray    Sig: Place 2 sprays into both nostrils every 12 (twelve) hours.    Dispense:  30 mL    Refill:  12     I spent approximately 5 minutes reviewing the patient's history, current symptoms and coordinating their care today.

## 2022-06-19 ENCOUNTER — Other Ambulatory Visit (HOSPITAL_COMMUNITY): Payer: Self-pay

## 2022-06-19 ENCOUNTER — Other Ambulatory Visit: Payer: Self-pay | Admitting: Obstetrics

## 2022-06-19 ENCOUNTER — Telehealth: Payer: Commercial Managed Care - PPO | Admitting: Medical

## 2022-06-19 DIAGNOSIS — F4310 Post-traumatic stress disorder, unspecified: Secondary | ICD-10-CM | POA: Diagnosis not present

## 2022-06-19 DIAGNOSIS — J321 Chronic frontal sinusitis: Secondary | ICD-10-CM

## 2022-06-19 MED ORDER — DESLORATADINE 5 MG PO TABS
5.0000 mg | ORAL_TABLET | Freq: Every day | ORAL | 11 refills | Status: AC
  Filled 2022-06-19: qty 30, 30d supply, fill #0
  Filled 2023-06-10: qty 30, 30d supply, fill #1

## 2022-06-19 MED ORDER — AZITHROMYCIN 250 MG PO TABS
ORAL_TABLET | ORAL | 2 refills | Status: AC
  Filled 2022-06-19: qty 6, 5d supply, fill #0
  Filled 2022-08-12: qty 6, 5d supply, fill #1

## 2022-06-30 ENCOUNTER — Other Ambulatory Visit (HOSPITAL_COMMUNITY): Payer: Self-pay

## 2022-07-14 ENCOUNTER — Other Ambulatory Visit: Payer: Self-pay

## 2022-07-14 ENCOUNTER — Other Ambulatory Visit: Payer: Self-pay | Admitting: Medical

## 2022-07-14 ENCOUNTER — Other Ambulatory Visit (HOSPITAL_COMMUNITY): Payer: Self-pay

## 2022-07-14 MED ORDER — RIZATRIPTAN BENZOATE 10 MG PO TABS
10.0000 mg | ORAL_TABLET | ORAL | 0 refills | Status: DC | PRN
  Filled 2022-07-14: qty 10, 30d supply, fill #0

## 2022-07-30 ENCOUNTER — Other Ambulatory Visit (INDEPENDENT_AMBULATORY_CARE_PROVIDER_SITE_OTHER): Payer: Commercial Managed Care - PPO

## 2022-07-30 DIAGNOSIS — Z23 Encounter for immunization: Secondary | ICD-10-CM | POA: Diagnosis not present

## 2022-08-03 DIAGNOSIS — Z0289 Encounter for other administrative examinations: Secondary | ICD-10-CM

## 2022-08-07 DIAGNOSIS — F4310 Post-traumatic stress disorder, unspecified: Secondary | ICD-10-CM | POA: Diagnosis not present

## 2022-08-12 ENCOUNTER — Other Ambulatory Visit (HOSPITAL_COMMUNITY): Payer: Self-pay

## 2022-08-24 ENCOUNTER — Ambulatory Visit (INDEPENDENT_AMBULATORY_CARE_PROVIDER_SITE_OTHER): Payer: Commercial Managed Care - PPO | Admitting: Family Medicine

## 2022-08-24 ENCOUNTER — Encounter (INDEPENDENT_AMBULATORY_CARE_PROVIDER_SITE_OTHER): Payer: Self-pay | Admitting: Family Medicine

## 2022-08-24 VITALS — BP 189/125 | HR 64 | Temp 98.0°F | Ht 66.5 in | Wt 258.0 lb

## 2022-08-24 DIAGNOSIS — R7303 Prediabetes: Secondary | ICD-10-CM | POA: Diagnosis not present

## 2022-08-24 DIAGNOSIS — Z1331 Encounter for screening for depression: Secondary | ICD-10-CM

## 2022-08-24 DIAGNOSIS — R0602 Shortness of breath: Secondary | ICD-10-CM | POA: Insufficient documentation

## 2022-08-24 DIAGNOSIS — R5383 Other fatigue: Secondary | ICD-10-CM | POA: Insufficient documentation

## 2022-08-24 DIAGNOSIS — F32A Depression, unspecified: Secondary | ICD-10-CM

## 2022-08-24 DIAGNOSIS — E559 Vitamin D deficiency, unspecified: Secondary | ICD-10-CM | POA: Diagnosis not present

## 2022-08-24 DIAGNOSIS — I10 Essential (primary) hypertension: Secondary | ICD-10-CM

## 2022-08-24 DIAGNOSIS — Z6841 Body Mass Index (BMI) 40.0 and over, adult: Secondary | ICD-10-CM | POA: Diagnosis not present

## 2022-08-24 DIAGNOSIS — E785 Hyperlipidemia, unspecified: Secondary | ICD-10-CM

## 2022-08-24 DIAGNOSIS — E669 Obesity, unspecified: Secondary | ICD-10-CM

## 2022-08-24 DIAGNOSIS — E7849 Other hyperlipidemia: Secondary | ICD-10-CM | POA: Diagnosis not present

## 2022-08-25 NOTE — Progress Notes (Unsigned)
Chief Complaint:   OBESITY REN…E LYE (MR# [id number]) is a 42 y.o. female who presents for evaluation and treatment of obesity and related comorbidities. Current BMI is Body mass index is 41.02 kg/m. Christine Fields has been struggling with her weight for many years and has been unsuccessful in either losing weight, maintaining weight loss, or reaching her healthy weight goal.  Christine Fields is currently in the action stage of change and ready to dedicate time achieving and maintaining a healthier weight. Christine Fields is interested in becoming our patient and working on intensive lifestyle modifications including (but not limited to) diet and exercise for weight loss.  Christine Fields's habits were reviewed today and are as follows: Her family eats meals together, she has been heavy most of her life, her heaviest weight ever was 272 pounds, she is a picky eater and doesn't like to eat healthier foods, she has significant food cravings issues, she skips meals frequently, she is frequently drinking liquids with calories, and she frequently makes poor food choices.  Depression Screen Tarea's Food and Mood (modified PHQ-9) score was 9.  Subjective:   1. Other fatigue Christine Fields admits to daytime somnolence and admits to waking up still tired. Patient has a history of symptoms of daytime fatigue, morning fatigue, and morning headache. Christine Fields generally gets 5 or 6 hours of sleep per night, and states that she has nightime awakenings. Christine Fields is not present. Apneic episodes are not present. [name] is 5.   2. SOBOE (shortness of breath on exertion) Christine Fields notes mild shortness of breath, occurs when she is walking up stairs (3+ flights). She notes getting out of breath sooner with activity than she used to. This has not gotten worse recently. Christine Fields denies shortness of breath at rest or orthopnea.  3. Prediabetes Patient has a strong family history of diabetes mellitus, and she has had elevated A1c up  to 6.2. She is not on metformin and she is working on her diet.   4. Essential hypertension, benign Patient's blood pressure is elevated today. She states it is normally 130's/80's.   5. Other hyperlipidemia Patient is on Crestor, and she is working on her diet. She denies chest pain.   6. Vitamin D deficiency Patient is on OTC Vitamin D. She has no recent labs, and she notes some fatigue.   Assessment/Plan:   1. Other fatigue [name] does feel that her weight is causing her energy to be lower than it should be. Fatigue may be related to obesity, depression or many other causes. Labs will be ordered, and in the meanwhile, [name] will focus on self care including making healthy food choices, increasing physical activity and focusing on stress reduction.  - Vitamin B12 - TSH - Folate  2. SOBOE (shortness of breath on exertion) Christine Fields does feel that she gets out of breath more easily that she used to when she exercises. Christine Fields's shortness of breath appears to be obesity related and exercise induced. She has agreed to work on weight loss and gradually increase exercise to treat her exercise induced shortness of breath. Will continue to monitor closely.  3. Prediabetes We will check labs today. Patient will start her Category 2 eating plan, and we will follow-up at her next visit.   - CMP14+EGFR - Insulin, random - Hemoglobin A1c  4. Essential hypertension, benign We will check labs today. Patient will continue her current medications, and she will check her blood pressure at home. She will start her Category 2 eating  plan, and we will follow-up at her next visit.   5. Other hyperlipidemia We will check labs today. Patient will start her Category 2 eating plan, and we will follow-up at her next visit.   - Lipid Panel With LDL/HDL Ratio  6. Vitamin D deficiency We will check labs today, and we will follow-up at patient's next visit.   - VITAMIN D 25 Hydroxy (Vit-D Deficiency,  Fractures)  7. Depression screening Christine Fields had a positive depression screening. Depression is commonly associated with obesity and often results in emotional eating behaviors. We will monitor this closely and work on CBT to help improve the non-hunger eating patterns. Referral to Psychology may be required if no improvement is seen as she continues in our clinic.  8. BMI 40.0-44.9, adult (HCC)  9. Obesity,Beginning BMI 41.0 Christine Fields is currently in the action stage of change and her goal is to continue with weight loss efforts. I recommend [name] begin the structured treatment plan as follows:  She has agreed to the Category 2 Plan + 100 calories.  Exercise goals: As is.    Behavioral modification strategies: increasing lean protein intake.  She was informed of the importance of frequent follow-up visits to maximize her success with intensive lifestyle modifications for her multiple health conditions. She was informed we would discuss her lab results at her next visit unless there is a critical issue that needs to be addressed sooner. [name] agreed to keep her next visit at the agreed upon time to discuss these results.  Objective:   Blood pressure (!) 189/125, pulse 64, temperature 98 F (36.7 C), height 5' 6.5" (1.689 m), weight 258 lb (117 kg), SpO2 98%. Body mass index is 41.02 kg/m.  EKG: Normal sinus rhythm, rate 67 BPM.  Indirect Calorimeter completed today shows a VO2 of 255 and a REE of 1757.  Her calculated basal metabolic rate is 3474 thus her basal metabolic rate is worse than expected.  Lab Results  Component Value Date   CREATININE 0.74 08/24/2022   BUN 11 08/24/2022   NA 137 08/24/2022   K 3.9 08/24/2022   CL 101 08/24/2022   CO2 23 08/24/2022   Lab Results  Component Value Date   ALT 14 08/24/2022   AST 14 08/24/2022   ALKPHOS 62 08/24/2022   BILITOT 0.6 08/24/2022   Lab Results  Component Value Date   HGBA1C 6.0 (H) 08/24/2022   HGBA1C 6.1 (H) 01/28/2022    HGBA1C 5.7 (A) 05/30/2021   HGBA1C 6.1 (H) 11/12/2020   HGBA1C 6.2 (H) 11/10/2019   Lab Results  Component Value Date   INSULIN 14.0 08/24/2022   INSULIN 18.4 05/03/2019   Lab Results  Component Value Date   TSH 4.660 (H) 08/24/2022   Lab Results  Component Value Date   CHOL 169 08/24/2022   HDL 37 (L) 08/24/2022   LDLCALC 117 (H) 08/24/2022   LDLDIRECT 103 (H) 11/10/2019   TRIG 81 08/24/2022   CHOLHDL 5.4 (H) 01/28/2022   Lab Results  Component Value Date   WBC 7.7 01/28/2022   HGB 14.1 01/28/2022   HCT 42.7 01/28/2022   MCV 86 01/28/2022   PLT 219 01/28/2022   No results found for: "IRON", "TIBC", "FERRITIN"  Attestation Statements:   Reviewed by clinician on day of visit: allergies, medications, problem list, medical history, surgical history, family history, social history, and previous encounter notes.  Time spent on visit including pre-visit chart review and post-visit charting and care was 40 minutes.  I, Burt Knack, am acting as transcriptionist for Quillian Quince, MD.  I have reviewed the above documentation for accuracy and completeness, and I agree with the above. - Quillian Quince, MD

## 2022-08-28 ENCOUNTER — Other Ambulatory Visit (HOSPITAL_COMMUNITY): Payer: Self-pay

## 2022-09-07 ENCOUNTER — Other Ambulatory Visit (HOSPITAL_COMMUNITY): Payer: Self-pay

## 2022-09-07 ENCOUNTER — Encounter (INDEPENDENT_AMBULATORY_CARE_PROVIDER_SITE_OTHER): Payer: Self-pay | Admitting: Family Medicine

## 2022-09-07 ENCOUNTER — Ambulatory Visit (INDEPENDENT_AMBULATORY_CARE_PROVIDER_SITE_OTHER): Payer: Commercial Managed Care - PPO | Admitting: Family Medicine

## 2022-09-07 ENCOUNTER — Other Ambulatory Visit: Payer: Self-pay

## 2022-09-07 VITALS — BP 164/106 | HR 57 | Temp 98.0°F | Ht 66.5 in | Wt 260.0 lb

## 2022-09-07 DIAGNOSIS — E559 Vitamin D deficiency, unspecified: Secondary | ICD-10-CM | POA: Diagnosis not present

## 2022-09-07 DIAGNOSIS — Z6841 Body Mass Index (BMI) 40.0 and over, adult: Secondary | ICD-10-CM

## 2022-09-07 DIAGNOSIS — R7303 Prediabetes: Secondary | ICD-10-CM

## 2022-09-07 DIAGNOSIS — E538 Deficiency of other specified B group vitamins: Secondary | ICD-10-CM

## 2022-09-07 DIAGNOSIS — R030 Elevated blood-pressure reading, without diagnosis of hypertension: Secondary | ICD-10-CM | POA: Insufficient documentation

## 2022-09-07 DIAGNOSIS — E669 Obesity, unspecified: Secondary | ICD-10-CM | POA: Diagnosis not present

## 2022-09-07 DIAGNOSIS — E7849 Other hyperlipidemia: Secondary | ICD-10-CM | POA: Diagnosis not present

## 2022-09-07 DIAGNOSIS — R7989 Other specified abnormal findings of blood chemistry: Secondary | ICD-10-CM | POA: Insufficient documentation

## 2022-09-07 MED ORDER — VITAMIN D (ERGOCALCIFEROL) 1.25 MG (50000 UNIT) PO CAPS
50000.0000 [IU] | ORAL_CAPSULE | ORAL | 0 refills | Status: DC
Start: 2022-09-07 — End: 2022-10-06
  Filled 2022-09-07: qty 5, 35d supply, fill #0

## 2022-09-07 MED ORDER — METFORMIN HCL 500 MG PO TABS
500.0000 mg | ORAL_TABLET | Freq: Every day | ORAL | 0 refills | Status: DC
Start: 2022-09-07 — End: 2022-10-06
  Filled 2022-09-07: qty 30, 30d supply, fill #0

## 2022-09-08 LAB — TSH+T4F+T3FREE
Free T4: 1.42 ng/dL (ref 0.82–1.77)
T3, Free: 2.9 pg/mL (ref 2.0–4.4)
TSH: 3.86 u[IU]/mL (ref 0.450–4.500)

## 2022-09-08 NOTE — Progress Notes (Unsigned)
Chief Complaint:   OBESITY Christine Fields is here to discuss her progress with her obesity treatment plan along with follow-up of her obesity related diagnoses. Fisher is on the Category 2 Plan + 100 calories and states she is following her eating plan approximately 50% of the time. [name] states she did some walking.    Today's visit was #: 2 Starting weight: 258 lbs Starting date: 08/24/2022 Today's weight: 260 lbs Today's date: 09/07/2022 Total lbs lost to date: 0 Total lbs lost since last in-office visit: 0  Interim History: Patient tried to follow her eating plan, but she is having questions about the extra sauces, etc.  She is open to looking at other meal plans.  Subjective:   1. Other hyperlipidemia Patient is working on decreasing cholesterol with her diet.  Her LDL is still above goal, and she is not on statin.  I discussed labs with the patient today.  2. Prediabetes Patient's A1c is elevated at 6.0, and she notes polyphagia.  Both parents have type 2 diabetes mellitus.  I discussed labs with the patient today.  3. Vitamin D deficiency Patient is on OTC vitamin D, but her level is not yet at goal.  I discussed labs with the patient today.  4. B12 deficiency Patient's B12 is below goal, and she has been eating less meat overall.  I discussed labs with the patient today.  5. Elevated TSH Patient's last TSH was elevated, and she notes fatigue.  6. Elevated blood pressure reading Patient's blood pressure is elevated even with recheck.  She is working on her diet and weight loss.  Assessment/Plan:   1. Other hyperlipidemia Patient will continue with her low-cholesterol plan, and we will recheck labs in 3 months.  2. Prediabetes Patient agreed to start metformin 500 mg every morning with food, with no refills.  We will follow-up at her next visit.  - metFORMIN (GLUCOPHAGE) 500 MG tablet; Take 1 tablet (500 mg total) by mouth daily with breakfast.  Dispense: 30 tablet;  Refill: 0  3. Vitamin D deficiency Patient agreed to start prescription vitamin D 50,000 IU once weekly with no refills.  - Vitamin D, Ergocalciferol, (DRISDOL) 1.25 MG (50000 UNIT) CAPS capsule; Take 1 capsule (50,000 Units total) by mouth every 7 (seven) days.  Dispense: 5 capsule; Refill: 0  4. B12 deficiency Patient will continue a B12 rich diet, and we will recheck labs in 3 months.  5. Elevated TSH We will recheck labs today, and we will follow-up at patient's next visit in 2 weeks.  - TSH+T4F+T3Free  6. Elevated blood pressure reading We will recheck her blood pressure and 2 weeks, and patient will continue with her diet and weight loss.  7. BMI 40.0-44.9, adult (HCC)  8. Obesity,Beginning BMI 41.0 [name] is currently in the action stage of change. As such, her goal is to continue with weight loss efforts. She has agreed to keeping a food journal and adhering to recommended goals of 1200-1500 calories and 85+ grams of protein daily.   Behavioral modification strategies: increasing lean protein intake and keeping a strict food journal.  [name] has agreed to follow-up with our clinic in 2 weeks. She was informed of the importance of frequent follow-up visits to maximize her success with intensive lifestyle modifications for her multiple health conditions.   [name] was informed we would discuss her lab results at her next visit unless there is a critical issue that needs to be addressed sooner. [name] agreed to keep  her next visit at the agreed upon time to discuss these results.  Objective:   Blood pressure (!) 164/106, pulse (!) 57, temperature 98 F (36.7 C), height 5' 6.5" (1.689 m), weight 260 lb (117.9 kg), SpO2 98%. Body mass index is 41.34 kg/m.  Lab Results  Component Value Date   CREATININE 0.74 08/24/2022   BUN 11 08/24/2022   NA 137 08/24/2022   K 3.9 08/24/2022   CL 101 08/24/2022   CO2 23 08/24/2022   Lab Results  Component Value Date   ALT 14  08/24/2022   AST 14 08/24/2022   ALKPHOS 62 08/24/2022   BILITOT 0.6 08/24/2022   Lab Results  Component Value Date   HGBA1C 6.0 (H) 08/24/2022   HGBA1C 6.1 (H) 01/28/2022   HGBA1C 5.7 (A) 05/30/2021   HGBA1C 6.1 (H) 11/12/2020   HGBA1C 6.2 (H) 11/10/2019   Lab Results  Component Value Date   INSULIN 14.0 08/24/2022   INSULIN 18.4 05/03/2019   Lab Results  Component Value Date   TSH 3.860 09/07/2022   Lab Results  Component Value Date   CHOL 169 08/24/2022   HDL 37 (L) 08/24/2022   LDLCALC 117 (H) 08/24/2022   LDLDIRECT 103 (H) 11/10/2019   TRIG 81 08/24/2022   CHOLHDL 5.4 (H) 01/28/2022   Lab Results  Component Value Date   VD25OH 36.3 08/24/2022   VD25OH 30.1 11/12/2020   VD25OH 20.0 (L) 11/10/2019   Lab Results  Component Value Date   WBC 7.7 01/28/2022   HGB 14.1 01/28/2022   HCT 42.7 01/28/2022   MCV 86 01/28/2022   PLT 219 01/28/2022   No results found for: "IRON", "TIBC", "FERRITIN"  Attestation Statements:   Reviewed by clinician on day of visit: allergies, medications, problem list, medical history, surgical history, family history, social history, and previous encounter notes.  Time spent on visit including pre-visit chart review and post-visit care and charting was 40 minutes.   I, Burt Knack, am acting as transcriptionist for Quillian Quince, MD.  I have reviewed the above documentation for accuracy and completeness, and I agree with the above. -  Quillian Quince, MD

## 2022-10-02 ENCOUNTER — Other Ambulatory Visit (HOSPITAL_COMMUNITY): Payer: Self-pay

## 2022-10-02 MED ORDER — INFLUENZA VIRUS VACC SPLIT PF (FLUZONE) 0.5 ML IM SUSY
0.5000 mL | PREFILLED_SYRINGE | Freq: Once | INTRAMUSCULAR | 0 refills | Status: AC
  Filled 2022-10-02: qty 0.5, 1d supply, fill #0

## 2022-10-06 ENCOUNTER — Encounter (INDEPENDENT_AMBULATORY_CARE_PROVIDER_SITE_OTHER): Payer: Self-pay | Admitting: Family Medicine

## 2022-10-06 ENCOUNTER — Other Ambulatory Visit (HOSPITAL_COMMUNITY): Payer: Self-pay

## 2022-10-06 ENCOUNTER — Telehealth (INDEPENDENT_AMBULATORY_CARE_PROVIDER_SITE_OTHER): Payer: Commercial Managed Care - PPO | Admitting: Family Medicine

## 2022-10-06 VITALS — Ht 66.5 in | Wt 260.0 lb

## 2022-10-06 DIAGNOSIS — E559 Vitamin D deficiency, unspecified: Secondary | ICD-10-CM | POA: Diagnosis not present

## 2022-10-06 DIAGNOSIS — E669 Obesity, unspecified: Secondary | ICD-10-CM

## 2022-10-06 DIAGNOSIS — R7303 Prediabetes: Secondary | ICD-10-CM | POA: Diagnosis not present

## 2022-10-06 DIAGNOSIS — Z6841 Body Mass Index (BMI) 40.0 and over, adult: Secondary | ICD-10-CM

## 2022-10-06 MED ORDER — METFORMIN HCL 500 MG PO TABS
500.0000 mg | ORAL_TABLET | Freq: Every day | ORAL | 0 refills | Status: DC
  Filled 2022-10-06: qty 30, 30d supply, fill #0

## 2022-10-06 MED ORDER — VITAMIN D (ERGOCALCIFEROL) 1.25 MG (50000 UNIT) PO CAPS
50000.0000 [IU] | ORAL_CAPSULE | ORAL | 0 refills | Status: DC
Start: 2022-10-06 — End: 2022-11-10
  Filled 2022-10-06: qty 5, 35d supply, fill #0

## 2022-10-06 NOTE — Progress Notes (Unsigned)
TeleHealth Visit:  Due to the COVID-19 pandemic, this visit was completed with telemedicine (audio/video) technology to reduce patient and provider exposure as well as to preserve personal protective equipment.   Christine Fields has verbally consented to this TeleHealth visit. The patient is located at home, the provider is located at the [facility] office. The participants in this visit include the listed provider and patient. The visit was conducted today via MyChart video.   Chief Complaint: OBESITY Christine Fields is here to discuss her progress with her obesity treatment plan along with follow-up of her obesity related diagnoses. Christine Fields is on keeping a food journal and adhering to recommended goals of 1200-1500 calories and 85+ grams of protein and states she is following her eating plan approximately 90% of the time. Christine Fields states she is doing cardio, strengthening, and playing Tennis for 40 minutes 4 times per week.  Today's visit was #: 3 Starting weight: 258 lbs Starting date: 08/24/2022  Interim History: Patient is working on following her plan better.  She notes her meal planning is getting better, but she still struggles with snacking.  Subjective:   1. Prediabetes Patient started metformin and she has some loose stools.  She continues to work on her diet and weight loss.  2. Vitamin D deficiency Patient is doing well on her vitamin D with no side effects noted.  Assessment/Plan:   1. Prediabetes Patient will continue metformin 500 mg every morning, and we will refill for 1 month.  We will recheck labs in 2 months.  - metFORMIN (GLUCOPHAGE) 500 MG tablet; Take 1 tablet (500 mg total) by mouth daily with breakfast.  Dispense: 30 tablet; Refill: 0  2. Vitamin D deficiency Patient will continue prescription vitamin D 50,000 IU once weekly, and we will refill for 1 month.  We will recheck labs in 2 months.  - Vitamin D, Ergocalciferol, (DRISDOL) 1.25 MG (50000 UNIT) CAPS  capsule; Take 1 capsule (50,000 Units total) by mouth every 7 (seven) days.  Dispense: 5 capsule; Refill: 0  3. BMI 40.0-44.9, adult (HCC)  4. Obesity,Beginning BMI 41.0 Christine Fields is currently in the action stage of change. As such, her goal is to continue with weight loss efforts. She has agreed to keeping a food journal and adhering to recommended goals of 1200-1500 calories and 85+ grams of protein daily.   Exercise goals: As is.   Behavioral modification strategies: increasing lean protein intake.  Christine Fields has agreed to follow-up with our clinic in 2 weeks. She was informed of the importance of frequent follow-up visits to maximize her success with intensive lifestyle modifications for her multiple health conditions.  Objective:   VITALS: Per patient if applicable, see vitals. GENERAL: Alert and in no acute distress. CARDIOPULMONARY: No increased WOB. Speaking in clear sentences.  PSYCH: Pleasant and cooperative. Speech normal rate and rhythm. Affect is appropriate. Insight and judgement are appropriate. Attention is focused, linear, and appropriate.  NEURO: Oriented as arrived to appointment on time with no prompting.   Lab Results  Component Value Date   CREATININE 0.74 08/24/2022   BUN 11 08/24/2022   NA 137 08/24/2022   K 3.9 08/24/2022   CL 101 08/24/2022   CO2 23 08/24/2022   Lab Results  Component Value Date   ALT 14 08/24/2022   AST 14 08/24/2022   ALKPHOS 62 08/24/2022   BILITOT 0.6 08/24/2022   Lab Results  Component Value Date   HGBA1C 6.0 (H) 08/24/2022   HGBA1C 6.1 (H)  01/28/2022   HGBA1C 5.7 (A) 05/30/2021   HGBA1C 6.1 (H) 11/12/2020   HGBA1C 6.2 (H) 11/10/2019   Lab Results  Component Value Date   INSULIN 14.0 08/24/2022   INSULIN 18.4 05/03/2019   Lab Results  Component Value Date   TSH 3.860 09/07/2022   Lab Results  Component Value Date   CHOL 169 08/24/2022   HDL 37 (L) 08/24/2022   LDLCALC 117 (H) 08/24/2022   LDLDIRECT 103 (H) 11/10/2019    TRIG 81 08/24/2022   CHOLHDL 5.4 (H) 01/28/2022   Lab Results  Component Value Date   VD25OH 36.3 08/24/2022   VD25OH 30.1 11/12/2020   VD25OH 20.0 (L) 11/10/2019   Lab Results  Component Value Date   WBC 7.7 01/28/2022   HGB 14.1 01/28/2022   HCT 42.7 01/28/2022   MCV 86 01/28/2022   PLT 219 01/28/2022   No results found for: "IRON", "TIBC", "FERRITIN"  Attestation Statements:   Reviewed by clinician on day of visit: allergies, medications, problem list, medical history, surgical history, family history, social history, and previous encounter notes.   I, Burt Knack, am acting as transcriptionist for Quillian Quince, MD.  I have reviewed the above documentation for accuracy and completeness, and I agree with the above. - Quillian Quince, MD

## 2022-10-07 ENCOUNTER — Telehealth: Payer: Self-pay

## 2022-10-07 NOTE — Progress Notes (Signed)
10/07/2022  Patient ID: Christine Fields, female   DOB: 04-10-1980, 42 y.o.   MRN: [id number]  Attempted to contact patient to discuss HTN control and medications. Left HIPAA compliant message for patient to return my call at their convenience.   Sherrill Raring, PharmD Clinical Pharmacist [phone]

## 2022-10-12 ENCOUNTER — Other Ambulatory Visit (HOSPITAL_COMMUNITY): Payer: Self-pay

## 2022-10-12 ENCOUNTER — Ambulatory Visit: Payer: Commercial Managed Care - PPO | Admitting: Medical

## 2022-10-12 VITALS — BP 146/100 | HR 56 | Wt 261.4 lb

## 2022-10-12 DIAGNOSIS — I10 Essential (primary) hypertension: Secondary | ICD-10-CM | POA: Diagnosis not present

## 2022-10-12 MED ORDER — VALSARTAN 160 MG PO TABS
160.0000 mg | ORAL_TABLET | Freq: Every day | ORAL | 2 refills | Status: DC
  Filled 2022-10-12: qty 30, 30d supply, fill #0

## 2022-10-12 MED ORDER — BISOPROLOL-HYDROCHLOROTHIAZIDE 10-6.25 MG PO TABS
2.0000 | ORAL_TABLET | Freq: Every day | ORAL | 0 refills | Status: DC
  Filled 2022-10-12: qty 180, 90d supply, fill #0

## 2022-10-12 NOTE — Progress Notes (Signed)
Subjective:  SHINITA MAC is a 42 y.o. female who presents for Chief Complaint  Patient presents with   Hypertension    High BP readings 212/124 169/123 186/122 167/105     Here for concern about elevated blood pressures.  She brought her blood pressure Cuff with her today.  She has been getting high readings at home and at her weight management visits.  She is compliant with her current medication.  She is exercising regularly.  She is trying to lose weight  No symptoms of concern  No other aggravating or relieving factors.    No other c/o.  Past Medical History:  Diagnosis Date   Alkaline phosphatase elevation 2021   Hypertension    Migraine    Pre-diabetes    Vitamin D deficiency    Current Outpatient Medications on File Prior to Visit  Medication Sig Dispense Refill   desloratadine (CLARINEX) 5 MG tablet Take 1 tablet (5 mg total) by mouth daily. 30 tablet 11   ipratropium (ATROVENT) 0.03 % nasal spray Place 2 sprays into both nostrils every 12 (twelve) hours. 30 mL 12   levonorgestrel (MIRENA, 52 MG,) 20 MCG/24HR IUD      metFORMIN (GLUCOPHAGE) 500 MG tablet Take 1 tablet (500 mg total) by mouth daily with breakfast. 30 tablet 0   rizatriptan (MAXALT) 10 MG tablet Take 1 tablet (10 mg total) by mouth as needed for migraine, may repeat in 2 hours if needed 10 tablet 0   rosuvastatin (CRESTOR) 5 MG tablet Take 1 tablet (5 mg total) by mouth every other day. 90 tablet 1   Vitamin D, Ergocalciferol, (DRISDOL) 1.25 MG (50000 UNIT) CAPS capsule Take 1 capsule (50,000 Units total) by mouth every 7 (seven) days. 5 capsule 0   No current facility-administered medications on file prior to visit.     The following portions of the patient's history were reviewed and updated as appropriate: allergies, current medications, past family history, past medical history, past social history, past surgical history and problem list.  ROS Otherwise as in subjective  above  Objective: BP (!) 146/100   Pulse (!) 56   Wt 261 lb 6.4 oz (118.6 kg)   BMI 41.56 kg/m   General appearance: alert, no distress, well developed, well nourished neck: supple, no lymphadenopathy, no thyromegaly, no masses Heart: RRR, normal S1, S2, no murmurs Lungs: CTA bilaterally, no wheezes, rhonchi, or rales Pulses: 2+ radial pulses, 2+ pedal pulses, normal cap refill Ext: no edema    Assessment: Encounter Diagnosis  Name Primary?   Essential hypertension, benign Yes     Plan: I looked back and reviewed her EKGs from earlier this year and prior.  I reviewed her lab work from January 2024.  We will increase the dosing of her medications.  Advise she not check her blood pressure for another week or 2 to give time for her blood pressure to adjust  Limit salt, continue regular exercise.  Patient Instructions  Hypertension  Increase doses of both medicaiton  Increase Valsartan from 80mg  to 160mg  daily  Increase to Bisoprolol HCT from 10/6.25mg  daily to 20/12.5mg ,  2 tablets daily  Limit salt  Continue regular exercise  Drink at least 80 to 100 ounces of water daily  If you happen to get dizzy or lightheaded within the next week then go back to valsartan 80 mg for 1 week and then go up to the higher dose.       Kohana was seen today for hypertension.  Diagnoses and all orders for this visit:  Essential hypertension, benign  Other orders -     bisoprolol-hydrochlorothiazide (ZIAC) 10-6.25 MG tablet; Take 2 tablets by mouth daily. -     valsartan (DIOVAN) 160 MG tablet; Take 1 tablet (160 mg total) by mouth daily.    Follow up: 68mo

## 2022-10-12 NOTE — Patient Instructions (Addendum)
Hypertension  Increase doses of both medicaiton  Increase Valsartan from 80mg  to 160mg  daily  Increase to Bisoprolol HCT from 10/6.25mg  daily to 20/12.5mg ,  2 tablets daily  Limit salt  Continue regular exercise  Drink at least 80 to 100 ounces of water daily  If you happen to get dizzy or lightheaded within the next week then go back to valsartan 80 mg for 1 week and then go up to the higher dose.

## 2022-10-15 ENCOUNTER — Other Ambulatory Visit (HOSPITAL_COMMUNITY): Payer: Self-pay

## 2022-10-15 ENCOUNTER — Other Ambulatory Visit: Payer: Commercial Managed Care - PPO

## 2022-10-15 DIAGNOSIS — I10 Essential (primary) hypertension: Secondary | ICD-10-CM

## 2022-10-15 MED ORDER — OMRON 5 SERIES BP MONITOR DEVI
1.0000 | 0 refills | Status: AC
  Filled 2022-10-15: qty 1, 30d supply, fill #0

## 2022-10-15 NOTE — Progress Notes (Addendum)
   10/15/2022  Patient ID: Christine Fields, female   DOB: [birth year], 42 y.o.   MRN: [id number]  Contacted patient via telephone to follow up on hypertension. Confirmed patient is taking the following medications for hypertension:  Current hypertension medications:       Sig   bisoprolol-hydrochlorothiazide (ZIAC) 10-6.25 MG tablet Take 2 tablets by mouth daily.   valsartan (DIOVAN) 160 MG tablet Take 1 tablet (160 mg total) by mouth daily.       Has not been able to pick up a new blood pressure monitor yet. Will send in an rx for an updated monitor per standing bp protocol.   Patient has follow up with PCP scheduled 10/30 but instructed to contact us sooner if concerns/questions.  Sherrill Raring, PharmD Clinical Pharmacist [phone]

## 2022-10-16 ENCOUNTER — Other Ambulatory Visit (HOSPITAL_COMMUNITY): Payer: Self-pay

## 2022-10-23 DIAGNOSIS — Z1231 Encounter for screening mammogram for malignant neoplasm of breast: Secondary | ICD-10-CM | POA: Diagnosis not present

## 2022-10-23 LAB — HM MAMMOGRAPHY

## 2022-11-10 ENCOUNTER — Encounter (INDEPENDENT_AMBULATORY_CARE_PROVIDER_SITE_OTHER): Payer: Self-pay | Admitting: Family Medicine

## 2022-11-10 ENCOUNTER — Ambulatory Visit (INDEPENDENT_AMBULATORY_CARE_PROVIDER_SITE_OTHER): Payer: Commercial Managed Care - PPO | Admitting: Family Medicine

## 2022-11-10 ENCOUNTER — Other Ambulatory Visit (HOSPITAL_COMMUNITY): Payer: Self-pay

## 2022-11-10 VITALS — BP 181/123 | HR 56 | Temp 97.6°F | Ht 66.5 in | Wt 265.0 lb

## 2022-11-10 DIAGNOSIS — E559 Vitamin D deficiency, unspecified: Secondary | ICD-10-CM

## 2022-11-10 DIAGNOSIS — E669 Obesity, unspecified: Secondary | ICD-10-CM | POA: Diagnosis not present

## 2022-11-10 DIAGNOSIS — R7303 Prediabetes: Secondary | ICD-10-CM

## 2022-11-10 DIAGNOSIS — Z6841 Body Mass Index (BMI) 40.0 and over, adult: Secondary | ICD-10-CM

## 2022-11-10 MED ORDER — METFORMIN HCL 500 MG PO TABS
500.0000 mg | ORAL_TABLET | Freq: Every day | ORAL | 0 refills | Status: DC
  Filled 2022-11-10: qty 30, 30d supply, fill #0

## 2022-11-10 MED ORDER — VITAMIN D (ERGOCALCIFEROL) 1.25 MG (50000 UNIT) PO CAPS
50000.0000 [IU] | ORAL_CAPSULE | ORAL | 0 refills | Status: DC
Start: 2022-11-10 — End: 2022-12-08
  Filled 2022-11-10: qty 5, 35d supply, fill #0

## 2022-11-10 NOTE — Progress Notes (Unsigned)
Chief Complaint:   OBESITY Christine Fields is here to discuss her progress with her obesity treatment plan along with follow-up of her obesity related diagnoses. Christine Fields is on keeping a food journal and adhering to recommended goals of 1200-1500 calories and 85+ grams of protein and states she is following her eating plan approximately 25% of the time. Christine Fields states she is walking and at the gym for 30-45 minutes 7 times per week.  Today's visit was #: 4 Starting weight: 258 lbs Starting date: 08/24/2022 Today's weight: 265 lbs Today's date: 11/10/2022 Total lbs lost to date: 0 Total lbs lost since last in-office visit: 0  Interim History: Patient did some celebration eating while in [place] on vacation.  She gained some weight, but she is ready to get back on track with her weight loss efforts.  She notes trying to increase her protein but sometimes skipping breakfast.  Subjective:   1. Prediabetes Patient started metformin and she is doing well with minimal GI upset.  She is working on her diet and exercise.  2. Vitamin D deficiency Patient is on vitamin D, and she denies nausea, vomiting, or muscle weakness.  Assessment/Plan:   1. Prediabetes We will refill metformin 500 mg every morning for 1 month.  Patient will continue with her diet, exercise, and weight loss and we will recheck labs in 1 month.  - metFORMIN (GLUCOPHAGE) 500 MG tablet; Take 1 tablet (500 mg total) by mouth daily with breakfast.  Dispense: 30 tablet; Refill: 0  2. Vitamin D deficiency Patient will continue once weekly prescription vitamin D 50,000 IU, and we will refill for 1 month.  We will recheck labs in 1 month.  - Vitamin D, Ergocalciferol, (DRISDOL) 1.25 MG (50000 UNIT) CAPS capsule; Take 1 capsule (50,000 Units total) by mouth every 7 (seven) days.  Dispense: 5 capsule; Refill: 0  3. BMI 40.0-44.9, adult (HCC)  4. Obesity,Beginning BMI 41.0 Christine Fields is currently in the action stage of change. As such,  her goal is to get back to weightloss efforts . She has agreed to keeping a food journal and adhering to recommended goals of 1200-1500 calories and 85+ grams of protein daily.   Patient is to come to her next visit fasting for labs.  Exercise goals: As is.   Behavioral modification strategies: increasing lean protein intake.  Christine Fields has agreed to follow-up with our clinic in 4 weeks. She was informed of the importance of frequent follow-up visits to maximize her success with intensive lifestyle modifications for her multiple health conditions.   Objective:   Blood pressure (!) 181/123, pulse (!) 56, temperature 97.6 F (36.4 C), height 5' 6.5" (1.689 m), weight 265 lb (120.2 kg), SpO2 99%. Body mass index is 42.13 kg/m.  Lab Results  Component Value Date   CREATININE 0.74 08/24/2022   BUN 11 08/24/2022   NA 137 08/24/2022   K 3.9 08/24/2022   CL 101 08/24/2022   CO2 23 08/24/2022   Lab Results  Component Value Date   ALT 14 08/24/2022   AST 14 08/24/2022   ALKPHOS 62 08/24/2022   BILITOT 0.6 08/24/2022   Lab Results  Component Value Date   HGBA1C 6.0 (H) 08/24/2022   HGBA1C 6.1 (H) 01/28/2022   HGBA1C 5.7 (A) 05/30/2021   HGBA1C 6.1 (H) 11/12/2020   HGBA1C 6.2 (H) 11/10/2019   Lab Results  Component Value Date   INSULIN 14.0 08/24/2022   INSULIN 18.4 05/03/2019   Lab Results  Component  Value Date   TSH 3.860 09/07/2022   Lab Results  Component Value Date   CHOL 169 08/24/2022   HDL 37 (L) 08/24/2022   LDLCALC 117 (H) 08/24/2022   LDLDIRECT 103 (H) 11/10/2019   TRIG 81 08/24/2022   CHOLHDL 5.4 (H) 01/28/2022   Lab Results  Component Value Date   VD25OH 36.3 08/24/2022   VD25OH 30.1 11/12/2020   VD25OH 20.0 (L) 11/10/2019   Lab Results  Component Value Date   WBC 7.7 01/28/2022   HGB 14.1 01/28/2022   HCT 42.7 01/28/2022   MCV 86 01/28/2022   PLT 219 01/28/2022   No results found for: "IRON", "TIBC", "FERRITIN"  Attestation Statements:    Reviewed by clinician on day of visit: allergies, medications, problem list, medical history, surgical history, family history, social history, and previous encounter notes.   I, Burt Knack, am acting as transcriptionist for Quillian Quince, MD.  I have reviewed the above documentation for accuracy and completeness, and I agree with the above. -  Quillian Quince, MD

## 2022-11-11 ENCOUNTER — Other Ambulatory Visit (HOSPITAL_COMMUNITY): Payer: Self-pay

## 2022-11-11 ENCOUNTER — Ambulatory Visit: Payer: Commercial Managed Care - PPO | Admitting: Medical

## 2022-11-11 VITALS — BP 158/110 | HR 64 | Wt 268.8 lb

## 2022-11-11 DIAGNOSIS — I10 Essential (primary) hypertension: Secondary | ICD-10-CM

## 2022-11-11 DIAGNOSIS — Z6841 Body Mass Index (BMI) 40.0 and over, adult: Secondary | ICD-10-CM

## 2022-11-11 MED ORDER — VALSARTAN 320 MG PO TABS
320.0000 mg | ORAL_TABLET | Freq: Every day | ORAL | 2 refills | Status: DC
  Filled 2022-11-11: qty 30, 30d supply, fill #0
  Filled 2023-02-10: qty 30, 30d supply, fill #1

## 2022-11-11 NOTE — Progress Notes (Signed)
Subjective:  Christine Fields is a 42 y.o. female who presents for Chief Complaint  Patient presents with   1 month follow-up    1 month follow-up. Bp is going good.    Here for recheck on blood pressure.  I saw her a month ago.  At that time we increased valsartan from 80mg  to 160 mg daily and we increased bisoprolol HCT from 10/6.25 mg daily to 20/12.5 mg (2 tablets daily).  Denies any particular symptom.  She just got back from a trip to [facility] earlier this week and has little ankle swelling.  She did eat out several times on that trip and probably had more salt than usual.  She is exercising regularly.  She is trying to lose weight.  She [facility].  No other aggravating or relieving factors.    No other c/o.  Past Medical History:  Diagnosis Date   Alkaline phosphatase elevation 2021   Hypertension    Migraine    Pre-diabetes    Vitamin D deficiency    Current Outpatient Medications on File Prior to Visit  Medication Sig Dispense Refill   bisoprolol-hydrochlorothiazide (ZIAC) 10-6.25 MG tablet Take 2 tablets by mouth daily. 180 tablet 0   desloratadine (CLARINEX) 5 MG tablet Take 1 tablet (5 mg total) by mouth daily. 30 tablet 11   ipratropium (ATROVENT) 0.03 % nasal spray Place 2 sprays into both nostrils every 12 (twelve) hours. 30 mL 12   levonorgestrel (MIRENA, 52 MG,) 20 MCG/24HR IUD      metFORMIN (GLUCOPHAGE) 500 MG tablet Take 1 tablet (500 mg total) by mouth daily with breakfast. 30 tablet 0   rizatriptan (MAXALT) 10 MG tablet Take 1 tablet (10 mg total) by mouth as needed for migraine, may repeat in 2 hours if needed 10 tablet 0   rosuvastatin (CRESTOR) 5 MG tablet Take 1 tablet (5 mg total) by mouth every other day. 90 tablet 1   Vitamin D, Ergocalciferol, (DRISDOL) 1.25 MG (50000 UNIT) CAPS capsule Take 1 capsule (50,000 Units total) by mouth every 7 (seven) days. 5 capsule 0   Blood Pressure Monitoring (OMRON 5 SERIES BP MONITOR) DEVI  use as directed 1 each 0   No current facility-administered medications on file prior to visit.    The following portions of the patient's history were reviewed and updated as appropriate: allergies, current medications, past family history, past medical history, past social history, past surgical history and problem list.  ROS Otherwise as in subjective above    Objective: BP (!) 158/110   Pulse 64   Wt 268 lb 12.8 oz (121.9 kg)   BMI 42.74 kg/m   BP Readings from Last 3 Encounters:  11/11/22 (!) 158/110  11/10/22 (!) 181/123  10/12/22 (!) 146/100   Wt Readings from Last 3 Encounters:  11/11/22 268 lb 12.8 oz (121.9 kg)  11/10/22 265 lb (120.2 kg)  10/12/22 261 lb 6.4 oz (118.6 kg)    General appearance: alert, no distress, well developed, well nourished     Assessment: Encounter Diagnoses  Name Primary?   BMI 40.0-44.9, adult (HCC) Yes   Essential hypertension, benign       Plan: Hypertension-I suspect her elevated pressure today is partly due to known whitecoat hypertension as well as excess salt recently some food choices when she was on a trip to [facility].  We literally doubled the dose of both of her medications a month ago.  Continue with exercise, continue  back with healthy food choices  Continue the bisoprolol HCT 10/6 to 5 mg, 2 tablets daily.  Increase valsartan from 160 mg to 320 mg daily  Advise she wait a week and check some blood pressures at home 3 times a week and send me numbers via MyChart over the next 2 weeks  We discussed some of the weight loss medication options that she will check insurance coverage for.  We discussed that blood pressures are not improving over the next month and we will either have her see cardiology or get echocardiogram  Patient Instructions  Call your insurance to see if they cover the following weight loss medications: Qsymia Contrave  Saxenda   We are increasing Valsartan from 160mg  to 320mg  daily.    Double up on the 160mg  tablet until you run out, then the new dose will be the 320mg   Continue the Bisoprolol HCT, 2 tablet daily    Christine Fields was seen today for 1 month follow-up.  Diagnoses and all orders for this visit:  BMI 40.0-44.9, adult (HCC)  Essential hypertension, benign  Other orders -     valsartan (DIOVAN) 320 MG tablet; Take 1 tablet (320 mg total) by mouth daily.     Follow up: 29mo

## 2022-11-11 NOTE — Patient Instructions (Addendum)
Call your insurance to see if they cover the following weight loss medications: Qsymia Contrave  Saxenda   We are increasing Valsartan from 160mg  to 320mg  daily.   Double up on the 160mg  tablet until you run out, then the new dose will be the 320mg   Continue the Bisoprolol HCT, 2 tablet daily

## 2022-11-17 ENCOUNTER — Other Ambulatory Visit (HOSPITAL_COMMUNITY): Payer: Self-pay

## 2022-11-17 DIAGNOSIS — I10 Essential (primary) hypertension: Secondary | ICD-10-CM | POA: Diagnosis not present

## 2022-11-17 DIAGNOSIS — Z975 Presence of (intrauterine) contraceptive device: Secondary | ICD-10-CM | POA: Diagnosis not present

## 2022-11-17 DIAGNOSIS — Z01419 Encounter for gynecological examination (general) (routine) without abnormal findings: Secondary | ICD-10-CM | POA: Diagnosis not present

## 2022-12-08 ENCOUNTER — Encounter (INDEPENDENT_AMBULATORY_CARE_PROVIDER_SITE_OTHER): Payer: Self-pay | Admitting: Physician Assistant

## 2022-12-08 ENCOUNTER — Other Ambulatory Visit (HOSPITAL_COMMUNITY): Payer: Self-pay

## 2022-12-08 ENCOUNTER — Ambulatory Visit (INDEPENDENT_AMBULATORY_CARE_PROVIDER_SITE_OTHER): Payer: Commercial Managed Care - PPO | Admitting: Physician Assistant

## 2022-12-08 VITALS — BP 160/101 | HR 59 | Temp 98.0°F | Ht 66.5 in | Wt 263.0 lb

## 2022-12-08 DIAGNOSIS — I10 Essential (primary) hypertension: Secondary | ICD-10-CM

## 2022-12-08 DIAGNOSIS — E7849 Other hyperlipidemia: Secondary | ICD-10-CM | POA: Diagnosis not present

## 2022-12-08 DIAGNOSIS — E559 Vitamin D deficiency, unspecified: Secondary | ICD-10-CM | POA: Diagnosis not present

## 2022-12-08 DIAGNOSIS — E538 Deficiency of other specified B group vitamins: Secondary | ICD-10-CM

## 2022-12-08 DIAGNOSIS — R7303 Prediabetes: Secondary | ICD-10-CM | POA: Diagnosis not present

## 2022-12-08 DIAGNOSIS — Z6841 Body Mass Index (BMI) 40.0 and over, adult: Secondary | ICD-10-CM | POA: Diagnosis not present

## 2022-12-08 MED ORDER — METFORMIN HCL 500 MG PO TABS
500.0000 mg | ORAL_TABLET | Freq: Every day | ORAL | 0 refills | Status: DC
  Filled 2022-12-08: qty 30, 30d supply, fill #0

## 2022-12-08 MED ORDER — VITAMIN D (ERGOCALCIFEROL) 1.25 MG (50000 UNIT) PO CAPS
50000.0000 [IU] | ORAL_CAPSULE | ORAL | 0 refills | Status: DC
  Filled 2022-12-08: qty 5, 35d supply, fill #0

## 2022-12-08 NOTE — Progress Notes (Signed)
SUBJECTIVE:  Chief Complaint: Obesity  Interim History: Christine Fields is a 42 year old female with a history of prediabetes, hypertension, vitamin D and B12 deficiency, and hyperlipidemia, who is currently on a treatment plan for obesity.  She is down 2 lbs since last visit.   She reports being approximately 90% compliant with the plan . The patient has made dietary changes, focusing on increased protein intake, and has removed non-compliant foods from her home.  The patient's blood pressure was noted to be elevated during the visit, which she attributes to anxiety related to medical visits, possibly some component of "white coat hypertension." She is currently on Valsartan 160 mg daily and Ziac 10-6.25 mg 2 tablets daily for hypertension and has been following up closely with her PCP- Christine Oyster, PA-C for management of HTN. She reports a recent increase in her BP medication valsarta to 320 mg daiy, but she is currently still taking the lower dosage and we discussed going ahead and increasing to 320 mg daily. She did not take her medications this morning as she was fasting for labs. Her BP is elevated today.  She is on Metformin 500 mg once daily for primary indication of prediabetes. She is toleraing all of her medications well. She also takes a Vitamin D supplement without any reported issues.  The patient's primary concern is the risk of developing diabetes, given her family history. She expresses a desire to do more to prevent this, but struggles with finding the time for increased physical activity. She walks her dogs regularly as a form of exercise.  The patient's B12 levels were noted to be on the lower side during her last visit, but she reports her energy levels as being fine. She is not currently taking a B12 supplement or multivitamin.  The patient is also a Education officer, environmental in clinical psychology, which adds to her daily time constraints. She works as a Paramedic for [employer]. She  expresses a fear of needles and a strong desire to avoid insulin therapy for diabetes management.  Christine Fields is here to discuss her progress with her obesity treatment plan. She is on the Category 2 Plan and keeping a food journal and adhering to recommended goals of 1200-1500 calories and 85 grams of  protein and states she is following her eating plan approximately 90 % of the time. She states she is exercising walking her dogs, weight lifting 60 minutes 2-3 times per week.  Fasting labs obtained today.  She was informed we would discuss her lab results at her next visit unless there is a critical issue that needs to be addressed sooner. She agreed to keep her next visit at the agreed upon time to discuss these results.   OBJECTIVE: Visit Diagnoses: Problem List Items Addressed This Visit     Essential hypertension, benign   Morbid obesity (HCC)   Relevant Medications   metFORMIN (GLUCOPHAGE) 500 MG tablet   Vitamin D deficiency   Relevant Medications   Vitamin D, Ergocalciferol, (DRISDOL) 1.25 MG (50000 UNIT) CAPS capsule   Other Relevant Orders   VITAMIN D 25 Hydroxy (Vit-D Deficiency, Fractures)   Prediabetes - Primary   Relevant Medications   metFORMIN (GLUCOPHAGE) 500 MG tablet   Other Relevant Orders   CMP14+EGFR   Hemoglobin A1c   Insulin, random   Other hyperlipidemia   B12 deficiency   Obesity 42 year old with obesity, currently following her nutrition plan with good adherence overall.  Reports 90% compliance with dietary changes and has lost 2  pounds since the last visit. Discussed strategies for maintaining compliance, especially during holidays, and the importance of portion control and mindful eating. Emphasized the benefits of weight loss in preventing diabetes progression. - Continue current dietary plan - Encourage portion control and mindful eating during holidays - Recommend walking within 30 minutes of eating - Suggest online walking videos for indoor exercise  during winter months  Hypertension Blood pressure elevated today, likely due to not taking medication while fasting for labs. Currently on valsartan and Ziac. Recent increase in valsartan dosage from 160 mg to 320 mg by primary care physician, but has not yet started the increased dose. Discussed the importance of adhering to the increased dose to manage blood pressure effectively. - Discuss with primary care physician about starting the increased valsartan dose - Continue current medications: valsartan and Ziac Continue to work on nutrition plan to promote weight loss and improve BP control.  Recheck labs today.   Prediabetes Prediabetes with previous A1c of 6.0-6.1. On metformin with no issues, no GI side effects. Emphasized the importance of weight loss and dietary control to prevent progression to diabetes. Discussed the goal of reducing A1c to below 5.6 to avoid diabetes. - Recheck A1c - Check fasting insulin level - Continue metformin - Encourage weight loss and dietary control  Hyperlipidemia On Crestor 5 mg every other day. Reports no side effects.  Hyperlipidemia. Will recheck lipid panel during fasting labs. - Recheck lipid panel during fasting labs  Vitamin D Deficiency On Ergocalciferol 50,000 units every 7 days.  No N/V or muscle weakness with Ergocalciferol.  Vitamin D deficiency. Fasting for labs today to recheck vitamin D levels. - Recheck vitamin D level - Refill/continue Ergocalciferol 50,000 units weekly.     General Health Maintenance Fasting for labs today to monitor various health parameters. Discussed the importance of regular monitoring and adherence to treatment plans. - Recheck liver and kidney function - Check electrolytes - Refill metformin prescription - Schedule follow-up appointment with Dr. Dalbert Fields on December 23 at 7:40 AM.  Vitals Temp: 98 F (36.7 C) BP: (!) 160/101 Pulse Rate: (!) 59 SpO2: 100 %   Anthropometric Measurements Height: 5'  6.5" (1.689 m) Weight: 263 lb (119.3 kg) BMI (Calculated): 41.82 Weight at Last Visit: 265 lb Weight Lost Since Last Visit: 2 lb Weight Gained Since Last Visit: 0 Starting Weight: 258 lb Total Weight Loss (lbs): 0 lb (0 kg) Peak Weight: 272 lb   Body Composition  Body Fat %: 48.2 % Fat Mass (lbs): 127 lbs Muscle Mass (lbs): 129.8 lbs Total Body Water (lbs): 96 lbs Visceral Fat Rating : 14   Other Clinical Data Fasting: yes Labs: yes Today's Visit #: 5 Starting Date: 08/24/22     ASSESSMENT AND PLAN:  Diet: Christine Fields is currently in the action stage of change. As such, her goal is to continue with weight loss efforts. She has agreed to Category 2 Plan and keeping a food journal and adhering to recommended goals of 1200-1500 calories and 85 grams of protein.  Exercise: Christine Fields has been instructed to continue exercising as is for weight loss and overall health benefits.   Behavior Modification:  We discussed the following Behavioral Modification Strategies today: increasing lean protein intake, decreasing simple carbohydrates, increasing vegetables, increase H2O intake, increase high fiber foods, emotional eating strategies , and holiday eating strategies. We discussed various medication options to help Christine Fields with her weight loss efforts and we both agreed to continue metformin 500 mg daily for prediabetes.  Return in about 4 weeks (around 01/05/2023).Marland Kitchen She was informed of the importance of frequent follow up visits to maximize her success with intensive lifestyle modifications for her multiple health conditions.  Attestation Statements:   Reviewed by clinician on day of visit: allergies, medications, problem list, medical history, surgical history, family history, social history, and previous encounter notes.   Time spent on visit including pre-visit chart review and post-visit care and charting was 38 minutes.    Makesha Belitz, PA-C

## 2022-12-09 LAB — CMP14+EGFR
ALT: 23 [IU]/L (ref 0–32)
AST: 26 [IU]/L (ref 0–40)
Albumin: 4.4 g/dL (ref 3.9–4.9)
Alkaline Phosphatase: 67 [IU]/L (ref 44–121)
BUN/Creatinine Ratio: 13 (ref 9–23)
BUN: 10 mg/dL (ref 6–24)
Bilirubin Total: 0.8 mg/dL (ref 0.0–1.2)
CO2: 22 mmol/L (ref 20–29)
Calcium: 9.3 mg/dL (ref 8.7–10.2)
Chloride: 103 mmol/L (ref 96–106)
Creatinine, Ser: 0.77 mg/dL (ref 0.57–1.00)
Globulin, Total: 2.2 g/dL (ref 1.5–4.5)
Glucose: 110 mg/dL — ABNORMAL HIGH (ref 70–99)
Potassium: 4.2 mmol/L (ref 3.5–5.2)
Sodium: 139 mmol/L (ref 134–144)
Total Protein: 6.6 g/dL (ref 6.0–8.5)
eGFR: 97 mL/min/{1.73_m2} (ref >59)

## 2022-12-09 LAB — HEMOGLOBIN A1C
Est. average glucose Bld gHb Est-mCnc: 134 mg/dL
Hgb A1c MFr Bld: 6.3 % — ABNORMAL HIGH (ref 4.8–5.6)

## 2022-12-09 LAB — INSULIN, RANDOM: INSULIN: 13.7 u[IU]/mL (ref 2.6–24.9)

## 2022-12-09 LAB — VITAMIN D 25 HYDROXY (VIT D DEFICIENCY, FRACTURES): Vit D, 25-Hydroxy: 55 ng/mL (ref 30.0–100.0)

## 2022-12-14 ENCOUNTER — Other Ambulatory Visit (HOSPITAL_COMMUNITY): Payer: Self-pay

## 2022-12-31 ENCOUNTER — Other Ambulatory Visit (HOSPITAL_COMMUNITY): Payer: Self-pay

## 2023-01-04 ENCOUNTER — Ambulatory Visit (INDEPENDENT_AMBULATORY_CARE_PROVIDER_SITE_OTHER): Payer: Commercial Managed Care - PPO | Admitting: Family Medicine

## 2023-02-01 ENCOUNTER — Other Ambulatory Visit: Payer: Commercial Managed Care - PPO

## 2023-02-03 ENCOUNTER — Encounter: Payer: Commercial Managed Care - PPO | Admitting: Medical

## 2023-02-10 ENCOUNTER — Other Ambulatory Visit: Payer: Self-pay | Admitting: Medical

## 2023-02-10 ENCOUNTER — Other Ambulatory Visit (INDEPENDENT_AMBULATORY_CARE_PROVIDER_SITE_OTHER): Payer: Self-pay | Admitting: Physician Assistant

## 2023-02-10 ENCOUNTER — Other Ambulatory Visit (HOSPITAL_COMMUNITY): Payer: Self-pay

## 2023-02-10 ENCOUNTER — Encounter (HOSPITAL_COMMUNITY): Payer: Self-pay

## 2023-02-10 ENCOUNTER — Other Ambulatory Visit: Payer: Self-pay

## 2023-02-10 DIAGNOSIS — R7303 Prediabetes: Secondary | ICD-10-CM

## 2023-02-10 MED ORDER — BISOPROLOL-HYDROCHLOROTHIAZIDE 10-6.25 MG PO TABS
2.0000 | ORAL_TABLET | Freq: Every day | ORAL | 0 refills | Status: DC
  Filled 2023-02-10: qty 180, 90d supply, fill #0

## 2023-02-10 MED ORDER — ROSUVASTATIN CALCIUM 5 MG PO TABS
5.0000 mg | ORAL_TABLET | ORAL | 0 refills | Status: DC
  Filled 2023-02-10: qty 45, 90d supply, fill #0

## 2023-02-11 ENCOUNTER — Ambulatory Visit (INDEPENDENT_AMBULATORY_CARE_PROVIDER_SITE_OTHER): Payer: Commercial Managed Care - PPO | Admitting: Family Medicine

## 2023-02-11 ENCOUNTER — Other Ambulatory Visit (HOSPITAL_COMMUNITY): Payer: Self-pay

## 2023-02-11 ENCOUNTER — Encounter (INDEPENDENT_AMBULATORY_CARE_PROVIDER_SITE_OTHER): Payer: Self-pay | Admitting: Family Medicine

## 2023-02-11 VITALS — BP 153/101 | HR 52 | Temp 98.3°F | Ht 66.5 in | Wt 263.0 lb

## 2023-02-11 DIAGNOSIS — Z6841 Body Mass Index (BMI) 40.0 and over, adult: Secondary | ICD-10-CM | POA: Diagnosis not present

## 2023-02-11 DIAGNOSIS — R7303 Prediabetes: Secondary | ICD-10-CM | POA: Diagnosis not present

## 2023-02-11 DIAGNOSIS — E669 Obesity, unspecified: Secondary | ICD-10-CM

## 2023-02-11 DIAGNOSIS — E559 Vitamin D deficiency, unspecified: Secondary | ICD-10-CM | POA: Diagnosis not present

## 2023-02-11 MED ORDER — VITAMIN D (ERGOCALCIFEROL) 1.25 MG (50000 UNIT) PO CAPS
50000.0000 [IU] | ORAL_CAPSULE | ORAL | 0 refills | Status: DC
  Filled 2023-02-11: qty 5, 35d supply, fill #0

## 2023-02-11 MED ORDER — METFORMIN HCL 500 MG PO TABS
500.0000 mg | ORAL_TABLET | Freq: Every day | ORAL | 0 refills | Status: DC
  Filled 2023-02-11: qty 30, 30d supply, fill #0

## 2023-02-11 NOTE — Progress Notes (Signed)
.smr  Office: [phone]  /  Fax: [phone]  WEIGHT SUMMARY AND BIOMETRICS  Anthropometric Measurements Height: 5' 6.5" (1.689 m) Weight: 263 lb (119.3 kg) BMI (Calculated): 41.82 Weight at Last Visit: 263 lb Weight Lost Since Last Visit: 0 Weight Gained Since Last Visit: 0 Total Weight Loss (lbs): 0 lb (0 kg)   Body Composition  Body Fat %: 47.81 % Fat Mass (lbs): 126 lbs Muscle Mass (lbs): 130.4 lbs Total Body Water (lbs): 94 lbs Visceral Fat Rating : 14   Other Clinical Data Labs: no Today's Visit #: no    Chief Complaint: OBESITY    History of Present Illness   The patient presents to discuss her obesity.  She has maintained her weight over the last two months and is adhering to her category two plan about half the time. The holiday season posed challenges, disrupting her structured routine. She experiences increased cravings, particularly for sweets like cookies and cake, over healthier options like apples.  She is engaging in physical activity by walking or going to the gym for about 45 minutes, five times per week.  In terms of dietary habits, she snacks on almonds, which she acknowledges are high in calories. She has three bags of almonds from [employer] and finds them a healthy but calorie-dense snack. She also mentions having braces, which makes eating almonds challenging.  Regarding her current medications, she has completed her vitamin D supplements and is nearing the end of her metformin supply. She is on a low dose of metformin for prediabetes and has not experienced any gastrointestinal issues with it. She is concerned about potential side effects if the dosage is increased.          PHYSICAL EXAM:  Blood pressure (!) 153/101, pulse (!) 52, temperature 98.3 F (36.8 C), height 5' 6.5" (1.689 m), weight 263 lb (119.3 kg), SpO2 99%. Body mass index is 41.81 kg/m.  DIAGNOSTIC DATA REVIEWED:  BMET    Component Value Date/Time   NA 139 12/08/2022  0855   K 4.2 12/08/2022 0855   CL 103 12/08/2022 0855   CO2 22 12/08/2022 0855   GLUCOSE 110 (H) 12/08/2022 0855   GLUCOSE 108 (H) 07/11/2019 1124   BUN 10 12/08/2022 0855   CREATININE 0.77 12/08/2022 0855   CALCIUM 9.3 12/08/2022 0855   GFRNONAA 57 (L) 11/10/2019 1519   GFRAA 66 11/10/2019 1519   Lab Results  Component Value Date   HGBA1C 6.3 (H) 12/08/2022   HGBA1C 5.7 (H) 05/24/2017   Lab Results  Component Value Date   INSULIN 13.7 12/08/2022   INSULIN 18.4 05/03/2019   Lab Results  Component Value Date   TSH 3.860 09/07/2022   CBC    Component Value Date/Time   WBC 7.7 01/28/2022 1043   WBC 13.6 (H) 05/16/2011 2140   RBC 4.98 01/28/2022 1043   RBC 4.75 05/16/2011 2140   HGB 14.1 01/28/2022 1043   HCT 42.7 01/28/2022 1043   PLT 219 01/28/2022 1043   MCV 86 01/28/2022 1043   MCH 28.3 01/28/2022 1043   MCH 29.1 05/16/2011 2140   MCHC 33.0 01/28/2022 1043   MCHC 34.2 05/16/2011 2140   RDW 12.8 01/28/2022 1043   Iron Studies No results found for: "IRON", "TIBC", "FERRITIN", "IRONPCTSAT" Lipid Panel     Component Value Date/Time   CHOL 169 08/24/2022 0847   TRIG 81 08/24/2022 0847   HDL 37 (L) 08/24/2022 0847   CHOLHDL 5.4 (H) 01/28/2022 1043   LDLCALC 117 (H)  08/24/2022 0847   LDLDIRECT 103 (H) 11/10/2019 1519   Hepatic Function Panel     Component Value Date/Time   PROT 6.6 12/08/2022 0855   ALBUMIN 4.4 12/08/2022 0855   AST 26 12/08/2022 0855   ALT 23 12/08/2022 0855   ALKPHOS 67 12/08/2022 0855   BILITOT 0.8 12/08/2022 0855      Component Value Date/Time   TSH 3.860 09/07/2022 1552   Nutritional Lab Results  Component Value Date   VD25OH 55.0 12/08/2022   VD25OH 36.3 08/24/2022   VD25OH 30.1 11/12/2020     Assessment and Plan    Obesity Weight maintained over the last two months. Following category two plan about half the time. Exercising 45 minutes, five times per week. Discussed holiday challenges and need for structured eating  plan. Recommended a two-week low-carb eating plan to aid weight loss and reduce cravings. Emphasized hydration to prevent dehydration and headaches. - Initiate two-week low-carb eating plan - Increase water intake - Follow up in 2-3 weeks to assess progress  Prediabetes Currently on a low dose of metformin, tolerated well without gastrointestinal issues. Discussed potential dose increase if necessary. Explained that metformin dose ranges from one to four pills a day, and current dose is on the lower end. Patient expressed concern about potential gastrointestinal side effects with dose increase. - Continue metformin at current dose - Refill metformin prescription - Follow up in 2-3 weeks to reassess  Vitamin D Deficiency Completed course of vitamin D supplementation. No new issues reported. - Refill vitamin D prescription - Follow up in 2-3 weeks to reassess  Follow-up - Schedule follow-up appointment in 2-3 weeks.       She was informed of the importance of frequent follow up visits to maximize her success with intensive lifestyle modifications for her multiple health conditions.    Quillian Quince, MD

## 2023-02-25 ENCOUNTER — Encounter (INDEPENDENT_AMBULATORY_CARE_PROVIDER_SITE_OTHER): Payer: Self-pay | Admitting: Internal Medicine

## 2023-02-25 ENCOUNTER — Ambulatory Visit (INDEPENDENT_AMBULATORY_CARE_PROVIDER_SITE_OTHER): Payer: Commercial Managed Care - PPO | Admitting: Internal Medicine

## 2023-02-25 ENCOUNTER — Other Ambulatory Visit (HOSPITAL_COMMUNITY): Payer: Self-pay

## 2023-02-25 VITALS — BP 138/84 | Ht 66.5 in | Wt 262.0 lb

## 2023-02-25 DIAGNOSIS — R7303 Prediabetes: Secondary | ICD-10-CM

## 2023-02-25 DIAGNOSIS — E66813 Obesity, class 3: Secondary | ICD-10-CM

## 2023-02-25 DIAGNOSIS — Z6841 Body Mass Index (BMI) 40.0 and over, adult: Secondary | ICD-10-CM | POA: Diagnosis not present

## 2023-02-25 DIAGNOSIS — I10 Essential (primary) hypertension: Secondary | ICD-10-CM | POA: Diagnosis not present

## 2023-02-25 MED ORDER — METFORMIN HCL 500 MG PO TABS
500.0000 mg | ORAL_TABLET | Freq: Two times a day (BID) | ORAL | 0 refills | Status: DC
  Filled 2023-02-25 – 2023-02-26 (×2): qty 60, 30d supply, fill #0

## 2023-02-25 NOTE — Progress Notes (Unsigned)
Office: [phone]  /  Fax: [phone]  Weight Summary And Biometrics  Vitals BP: 138/84   Anthropometric Measurements Height: 5' 6.5" (1.689 m) Weight: 262 lb (118.8 kg) BMI (Calculated): 41.66 Weight at Last Visit: 263 lb Weight Lost Since Last Visit: 1 lb Weight Gained Since Last Visit: 0 lb Starting Weight: 258 lb Total Weight Loss (lbs): 0 lb (0 kg)   Body Composition  Body Fat %: 48 % Fat Mass (lbs): 126.2 lbs Muscle Mass (lbs): 129.8 lbs Total Body Water (lbs): 91.8 lbs Visceral Fat Rating : 14    No data recorded Today's Visit #: No  Starting Date: 08/24/22   Subjective   Chief Complaint: Obesity  Christine Fields is here to discuss her progress with her obesity treatment plan. She is on the low carb and states she is following her eating plan approximately 100 % of the time. She states she is exercising 60 minutes 3 times per week.  Weight Progress Since Last Visit:  This is my first encounter with Christine Fields she is a pleasant 43 year old female who is affected by obesity.  Associated conditions include hypertension, prediabetes, and dyslipidemia.    She reports following an ad libitum low-carb plan and finds the plan easy to follow.  Although upon review of nutritional intake she does incorporate starchy vegetables and high glycemic fruit.  She has also been doing 1 meal a day so likely not meeting her recommended protein goal.  She does not track or journal.  She has been exercising.  She is currently on metformin 500 mg for diabetes prevention and weight management.  She denies any adverse effects.  She is worried about progressing to diabetes and finds maintaining a low-carb diet a little bit challenging.  Challenges affecting patient progress: none.   Orexigenic Control: Denies problems with appetite and hunger signals.  Denies problems with satiety and satiation.  Denies problems with eating patterns and portion control.  Denies abnormal  cravings. Denies feeling deprived or restricted.   Pharmacotherapy for weight management: She is currently taking Metformin (off label use for incretin effect and / or insulin resistance and / or diabetes prevention) with adequate clinical response  and without side effects..   Assessment and Plan   Treatment Plan For Obesity:  Recommended Dietary Goals  Christine Fields is currently in the action stage of change. As such, her goal is to continue weight management plan. She has agreed to: She has been following our ad libitum low-carb plan but is only doing 1 meal a day and not getting enough protein.  She was advised to try to get in 2-3 balanced meals a day aiming for 30 to 40 g of protein per meal versus just doing 1 large meal at the end of the day.  Behavioral Health and Counseling  We discussed the following behavioral modification strategies today: continue to work on maintaining a reduced calorie state, getting the recommended amount of protein, incorporating whole foods, making healthy choices, staying well hydrated and practicing mindfulness when eating..  Additional education and resources provided today: None  Recommended Physical Activity Goals  Christine Fields has been advised to work up to 150 minutes of moderate intensity aerobic activity a week and strengthening exercises 2-3 times per week for cardiovascular health, weight loss maintenance and preservation of muscle mass.   She has agreed to :  Think about enjoyable ways to increase daily physical activity and overcoming barriers to exercise and Increase physical activity in their day and reduce sedentary time (  increase NEAT).  Pharmacotherapy  We discussed various medication options to help Christine Fields with her weight loss efforts and we both agreed to : adequate clinical response to current dose, continue current regimen  Associated Conditions Impacted by Obesity Treatment  Essential hypertension, benign Assessment & Plan: Blood  pressure control has improved but still above goal of less than 130/80.  Last office visit was uncontrolled.  On bisoprolol, hydrochlorothiazide and valsartan.  Bisoprolol may contribute to weight gain.  Losing 10% of body weight may improve blood pressure control.  Continue with recommended weight management strategy   Prediabetes Assessment & Plan: Patient had an upward trending hemoglobin A1c with most recent 6.3.  She is concerned about developing diabetes.  She has strong family history.  She has been trying to implement a low-carb plan but is only eating 1 meal a day.  She was educated on the carb insulin model of obesity and was provided with additional information.  She is currently on metformin for pharmacoprophylaxis without any adverse effects and will continue medication.  I recommend upward titration of medication if tolerated to 2000 mg a day.   Orders: -     metFORMIN HCl; Take 1 tablet (500 mg total) by mouth 2 (two) times daily with a meal.  Dispense: 60 tablet; Refill: 0  Class 3 severe obesity with serious comorbidity and body mass index (BMI) of 40.0 to 44.9 in adult, unspecified obesity type [facility]) Assessment & Plan: -Since enrollment patient has had difficulty losing weight with only 1 pound weight loss thus far. - We reviewed energy balance model and carb insulin model of obesity with patient as she is affected by both. -She also has a slower than predicted metabolic rate likely due to chronic skipping of meals, inadequate protein intake.  She should also be screened for sleep apnea if not already done. -Patient advised to increase frequency of meals, increase protein intake with a goal of 30 to 40 g per meal. -We also discussed the difference between simple and complex carbs and its effect on glycemic load.  She feels somewhat restricted as due to ethnicity there are certain carbs that she enjoys.  We discussed eating these carbs in moderation. -She will continue on metformin  for pharmacoprophylaxis and weight management. -She does not benefit from antiobesity medication due to restrictive eating pattern. -She will follow-up with Dr. Dalbert Garnet in 3 weeks to check on progress      Objective   Physical Exam:  Blood pressure 138/84, height 5' 6.5" (1.689 m), weight 262 lb (118.8 kg). Body mass index is 41.65 kg/m.  General: She is overweight, cooperative, alert, well developed, and in no acute distress. PSYCH: Has normal mood, affect and thought process.   HEENT: EOMI, sclerae are anicteric. Lungs: Normal breathing effort, no conversational dyspnea. Extremities: No edema.  Neurologic: No gross sensory or motor deficits. No tremors or fasciculations noted.    Diagnostic Data Reviewed:  BMET    Component Value Date/Time   NA 139 12/08/2022 0855   K 4.2 12/08/2022 0855   CL 103 12/08/2022 0855   CO2 22 12/08/2022 0855   GLUCOSE 110 (H) 12/08/2022 0855   GLUCOSE 108 (H) 07/11/2019 1124   BUN 10 12/08/2022 0855   CREATININE 0.77 12/08/2022 0855   CALCIUM 9.3 12/08/2022 0855   GFRNONAA 57 (L) 11/10/2019 1519   GFRAA 66 11/10/2019 1519   Lab Results  Component Value Date   HGBA1C 6.3 (H) 12/08/2022   HGBA1C 5.7 (H) 05/24/2017  Lab Results  Component Value Date   INSULIN 13.7 12/08/2022   INSULIN 18.4 05/03/2019   Lab Results  Component Value Date   TSH 3.860 09/07/2022   CBC    Component Value Date/Time   WBC 7.7 01/28/2022 1043   WBC 13.6 (H) 05/16/2011 2140   RBC 4.98 01/28/2022 1043   RBC 4.75 05/16/2011 2140   HGB 14.1 01/28/2022 1043   HCT 42.7 01/28/2022 1043   PLT 219 01/28/2022 1043   MCV 86 01/28/2022 1043   MCH 28.3 01/28/2022 1043   MCH 29.1 05/16/2011 2140   MCHC 33.0 01/28/2022 1043   MCHC 34.2 05/16/2011 2140   RDW 12.8 01/28/2022 1043   Iron Studies No results found for: "IRON", "TIBC", "FERRITIN", "IRONPCTSAT" Lipid Panel     Component Value Date/Time   CHOL 169 08/24/2022 0847   TRIG 81 08/24/2022 0847    HDL 37 (L) 08/24/2022 0847   CHOLHDL 5.4 (H) 01/28/2022 1043   LDLCALC 117 (H) 08/24/2022 0847   LDLDIRECT 103 (H) 11/10/2019 1519   Hepatic Function Panel     Component Value Date/Time   PROT 6.6 12/08/2022 0855   ALBUMIN 4.4 12/08/2022 0855   AST 26 12/08/2022 0855   ALT 23 12/08/2022 0855   ALKPHOS 67 12/08/2022 0855   BILITOT 0.8 12/08/2022 0855      Component Value Date/Time   TSH 3.860 09/07/2022 1552   Nutritional Lab Results  Component Value Date   VD25OH 55.0 12/08/2022   VD25OH 36.3 08/24/2022   VD25OH 30.1 11/12/2020    Follow-Up   Return in about 3 weeks (around 03/18/2023) for Dr.Beasley.Marland Kitchen She was informed of the importance of frequent follow up visits to maximize her success with intensive lifestyle modifications for her multiple health conditions.  Attestation Statement   Reviewed by clinician on day of visit: allergies, medications, problem list, medical history, surgical history, family history, social history, and previous encounter notes.     Worthy Rancher, MD

## 2023-02-25 NOTE — Progress Notes (Deleted)
Office: [phone]  /  Fax: [phone]  Weight Summary And Biometrics  No data recorded No data recorded No data recorded  No data recorded No data recorded No data recorded  Subjective   Chief Complaint: Obesity  Christine Fields is here to discuss her progress with her obesity treatment plan. She is on the {MWMwtlossportion/plan2:23431} and states she is following her eating plan approximately *** % of the time. She states she is exercising *** minutes *** times per week.  Weight Progress Since Last Visit:  Since last office visit she has {emweight change:30888}. She reports {EMADHERENCE:28838::"good adherence to reduced calorie nutritional plan."} She has been working on [employer] labels","not skipping meals","increasing protein intake at every meal","drinking more water","making healthier choices","reducing portion sizes","incorporating more whole foods"}   Challenges affecting patient progress: {EMOBESITYBARRIERS:28841}.   Orexigenic Control: {Actions; denies-reports:32002} problems with appetite and hunger signals.  {Actions; denies-reports:32002} problems with satiety and satiation.  {Actions; denies-reports:32002} problems with eating patterns and portion control.  {Actions; denies-reports:32002} abnormal cravings. {Actions; denies-reports:32002} feeling deprived or restricted.   Pharmacotherapy for weight management: She is currently taking {EMPharmaco:28845}.   Assessment and Plan   Treatment Plan For Obesity:  Recommended Dietary Goals  Rache is currently in the action stage of change. As such, her goal is to continue weight management plan. She has agreed to: {EMWTLOSSPLAN:29297::"continue current plan"}  Behavioral Health and Counseling  We discussed the following behavioral modification strategies today: {EMWMwtlossstrategies:28914::"continue to work on maintaining a reduced calorie state, getting the recommended amount of protein,  incorporating whole foods, making healthy choices, staying well hydrated and practicing mindfulness when eating."}.  Additional education and resources provided today: {EMadditionalresources:29169::"None"}  Recommended Physical Activity Goals  Christine Fields has been advised to work up to 150 minutes of moderate intensity aerobic activity a week and strengthening exercises 2-3 times per week for cardiovascular health, weight loss maintenance and preservation of muscle mass.   She has agreed to :  {EMEXERCISE:28847::"Think about enjoyable ways to increase daily physical activity and overcoming barriers to exercise","Increase physical activity in their day and reduce sedentary time (increase NEAT)."}  Pharmacotherapy  We discussed various medication options to help [facility] with her weight loss efforts and we both agreed to : {EMagreedrx:29170}  Associated Conditions Impacted by Obesity Treatment  Prediabetes     Objective   Physical Exam:  There were no vitals taken for this visit. There is no height or weight on file to calculate BMI.  General: She is overweight, cooperative, alert, well developed, and in no acute distress. PSYCH: Has normal mood, affect and thought process.   HEENT: EOMI, sclerae are anicteric. Lungs: Normal breathing effort, no conversational dyspnea. Extremities: No edema.  Neurologic: No gross sensory or motor deficits. No tremors or fasciculations noted.    Diagnostic Data Reviewed:  BMET    Component Value Date/Time   NA 139 12/08/2022 0855   K 4.2 12/08/2022 0855   CL 103 12/08/2022 0855   CO2 22 12/08/2022 0855   GLUCOSE 110 (H) 12/08/2022 0855   GLUCOSE 108 (H) 07/11/2019 1124   BUN 10 12/08/2022 0855   CREATININE 0.77 12/08/2022 0855   CALCIUM 9.3 12/08/2022 0855   GFRNONAA 57 (L) 11/10/2019 1519   GFRAA 66 11/10/2019 1519   Lab Results  Component Value Date   HGBA1C 6.3 (H) 12/08/2022   HGBA1C 5.7 (H) 05/24/2017   Lab Results  Component  Value Date   INSULIN 13.7 12/08/2022   INSULIN 18.4 05/03/2019   Lab Results  Component Value  Date   TSH 3.860 09/07/2022   CBC    Component Value Date/Time   WBC 7.7 01/28/2022 1043   WBC 13.6 (H) 05/16/2011 2140   RBC 4.98 01/28/2022 1043   RBC 4.75 05/16/2011 2140   HGB 14.1 01/28/2022 1043   HCT 42.7 01/28/2022 1043   PLT 219 01/28/2022 1043   MCV 86 01/28/2022 1043   MCH 28.3 01/28/2022 1043   MCH 29.1 05/16/2011 2140   MCHC 33.0 01/28/2022 1043   MCHC 34.2 05/16/2011 2140   RDW 12.8 01/28/2022 1043   Iron Studies No results found for: "IRON", "TIBC", "FERRITIN", "IRONPCTSAT" Lipid Panel     Component Value Date/Time   CHOL 169 08/24/2022 0847   TRIG 81 08/24/2022 0847   HDL 37 (L) 08/24/2022 0847   CHOLHDL 5.4 (H) 01/28/2022 1043   LDLCALC 117 (H) 08/24/2022 0847   LDLDIRECT 103 (H) 11/10/2019 1519   Hepatic Function Panel     Component Value Date/Time   PROT 6.6 12/08/2022 0855   ALBUMIN 4.4 12/08/2022 0855   AST 26 12/08/2022 0855   ALT 23 12/08/2022 0855   ALKPHOS 67 12/08/2022 0855   BILITOT 0.8 12/08/2022 0855      Component Value Date/Time   TSH 3.860 09/07/2022 1552   Nutritional Lab Results  Component Value Date   VD25OH 55.0 12/08/2022   VD25OH 36.3 08/24/2022   VD25OH 30.1 11/12/2020    Follow-Up   No follow-ups on file.Marland Kitchen She was informed of the importance of frequent follow up visits to maximize her success with intensive lifestyle modifications for her multiple health conditions.  Attestation Statement   Reviewed by clinician on day of visit: allergies, medications, problem list, medical history, surgical history, family history, social history, and previous encounter notes.     Worthy Rancher, MD

## 2023-02-26 ENCOUNTER — Other Ambulatory Visit (HOSPITAL_COMMUNITY): Payer: Self-pay

## 2023-02-26 NOTE — Assessment & Plan Note (Signed)
Blood pressure control has improved but still above goal of less than 130/80.  Last office visit was uncontrolled.  On bisoprolol, hydrochlorothiazide and valsartan.  Bisoprolol may contribute to weight gain.  Losing 10% of body weight may improve blood pressure control.  Continue with recommended weight management strategy

## 2023-02-26 NOTE — Assessment & Plan Note (Signed)
-  Since enrollment patient has had difficulty losing weight with only 1 pound weight loss thus far. - We reviewed energy balance model and carb insulin model of obesity with patient as she is affected by both. -She also has a slower than predicted metabolic rate likely due to chronic skipping of meals, inadequate protein intake.  She should also be screened for sleep apnea if not already done. -Patient advised to increase frequency of meals, increase protein intake with a goal of 30 to 40 g per meal. -We also discussed the difference between simple and complex carbs and its effect on glycemic load.  She feels somewhat restricted as due to ethnicity there are certain carbs that she enjoys.  We discussed eating these carbs in moderation. -She will continue on metformin for pharmacoprophylaxis and weight management. -She does not benefit from antiobesity medication due to restrictive eating pattern. -She will follow-up with Dr. Dalbert Garnet in 3 weeks to check on progress

## 2023-02-26 NOTE — Assessment & Plan Note (Signed)
Patient had an upward trending hemoglobin A1c with most recent 6.3.  She is concerned about developing diabetes.  She has strong family history.  She has been trying to implement a low-carb plan but is only eating 1 meal a day.  She was educated on the carb insulin model of obesity and was provided with additional information.  She is currently on metformin for pharmacoprophylaxis without any adverse effects and will continue medication.  I recommend upward titration of medication if tolerated to 2000 mg a day.

## 2023-03-10 ENCOUNTER — Ambulatory Visit: Payer: Commercial Managed Care - PPO | Admitting: Medical

## 2023-03-10 ENCOUNTER — Other Ambulatory Visit (HOSPITAL_COMMUNITY): Payer: Self-pay

## 2023-03-10 ENCOUNTER — Encounter: Payer: Self-pay | Admitting: Medical

## 2023-03-10 VITALS — BP 130/88 | HR 61 | Ht 67.25 in | Wt 267.4 lb

## 2023-03-10 DIAGNOSIS — R7303 Prediabetes: Secondary | ICD-10-CM | POA: Diagnosis not present

## 2023-03-10 DIAGNOSIS — Z1211 Encounter for screening for malignant neoplasm of colon: Secondary | ICD-10-CM | POA: Diagnosis not present

## 2023-03-10 DIAGNOSIS — I10 Essential (primary) hypertension: Secondary | ICD-10-CM

## 2023-03-10 DIAGNOSIS — Z975 Presence of (intrauterine) contraceptive device: Secondary | ICD-10-CM

## 2023-03-10 DIAGNOSIS — Z823 Family history of stroke: Secondary | ICD-10-CM

## 2023-03-10 DIAGNOSIS — Z0000 Encounter for general adult medical examination without abnormal findings: Secondary | ICD-10-CM

## 2023-03-10 DIAGNOSIS — R7301 Impaired fasting glucose: Secondary | ICD-10-CM | POA: Diagnosis not present

## 2023-03-10 DIAGNOSIS — Z23 Encounter for immunization: Secondary | ICD-10-CM | POA: Diagnosis not present

## 2023-03-10 DIAGNOSIS — Z136 Encounter for screening for cardiovascular disorders: Secondary | ICD-10-CM | POA: Diagnosis not present

## 2023-03-10 DIAGNOSIS — L989 Disorder of the skin and subcutaneous tissue, unspecified: Secondary | ICD-10-CM | POA: Diagnosis not present

## 2023-03-10 DIAGNOSIS — E7849 Other hyperlipidemia: Secondary | ICD-10-CM

## 2023-03-10 MED ORDER — VALSARTAN 320 MG PO TABS
320.0000 mg | ORAL_TABLET | Freq: Every day | ORAL | 2 refills | Status: DC
  Filled 2023-03-10: qty 90, 90d supply, fill #0
  Filled 2023-06-10: qty 90, 90d supply, fill #1
  Filled 2023-08-18 – 2023-09-07 (×2): qty 90, 90d supply, fill #2

## 2023-03-10 MED ORDER — BISOPROLOL-HYDROCHLOROTHIAZIDE 10-6.25 MG PO TABS
2.0000 | ORAL_TABLET | Freq: Every day | ORAL | 2 refills | Status: AC
  Filled 2023-03-10 – 2023-06-10 (×2): qty 180, 90d supply, fill #0
  Filled 2023-08-18 – 2023-09-07 (×2): qty 180, 90d supply, fill #1
  Filled 2024-01-09: qty 180, 90d supply, fill #2

## 2023-03-10 MED ORDER — RIZATRIPTAN BENZOATE 10 MG PO TABS
10.0000 mg | ORAL_TABLET | ORAL | 5 refills | Status: AC | PRN
  Filled 2023-03-10: qty 10, 30d supply, fill #0
  Filled 2023-06-10: qty 10, 30d supply, fill #1
  Filled 2023-08-18: qty 10, 30d supply, fill #2
  Filled 2024-01-09: qty 10, 30d supply, fill #3

## 2023-03-10 MED ORDER — ROSUVASTATIN CALCIUM 5 MG PO TABS
5.0000 mg | ORAL_TABLET | ORAL | 2 refills | Status: AC
  Filled 2023-03-10: qty 90, 180d supply, fill #0
  Filled 2023-06-10: qty 45, 90d supply, fill #0
  Filled 2024-01-09: qty 45, 90d supply, fill #1

## 2023-03-10 NOTE — Progress Notes (Signed)
 Subjective:   HPI  Christine Fields is a 43 y.o. female who presents for Chief Complaint  Patient presents with   Annual Exam    Fasting cpe, had labs done in August 2024/ November 2024 as [facility], referred for dermatology, possible Cologuard    Patient Care Team: Jaryah Aracena, Kermit Balo, PA-C as PCP - General (Family Medicine) Jodelle Red, MD as PCP - Cardiology (Cardiology) Sees dentist:[facility]) Sees eye doctor:Dr.Shapiro Dr. Marcene Corning, [facility] Dr. Corinna Capra, weight management clinic Dr. Tami Lin OB/Gyn    Concerns: Doing okay.  She has been messing with a pimple on her chin had some discoloration.  She like to see dermatology as the coloration will go away  Exercising going to the gym and walking her neighborhood.  Here for her last HPV vaccine.  She is compliant with her blood pressure medicines.  Home blood pressures run around 130/80  She is still seeing healthy weight and wellness clinic, still working to try to lose more weight   Reviewed their medical, surgical, family, social, medication, and allergy history and updated chart as appropriate.  Past Medical History:  Diagnosis Date   Alkaline phosphatase elevation 2021   Hypertension    Migraine    Pre-diabetes    Vitamin D deficiency     Family History  Problem Relation Age of Onset   Diabetes Father    Hypertension Father    Stroke Father    Hypertension Maternal Grandmother    Diabetes Maternal Grandfather    Cancer Maternal Grandfather      Current Outpatient Medications:    desloratadine (CLARINEX) 5 MG tablet, Take 1 tablet (5 mg total) by mouth daily., Disp: 30 tablet, Rfl: 11   ipratropium (ATROVENT) 0.03 % nasal spray, Place 2 sprays into both nostrils every 12 (twelve) hours., Disp: 30 mL, Rfl: 12   levonorgestrel (MIRENA, 52 MG,) 20 MCG/24HR IUD, , Disp: , Rfl:    metFORMIN (GLUCOPHAGE) 500 MG tablet, Take 1 tablet (500 mg total) by mouth 2  (two) times daily with a meal., Disp: 60 tablet, Rfl: 0   bisoprolol-hydrochlorothiazide (ZIAC) 10-6.25 MG tablet, Take 2 tablets by mouth daily., Disp: 180 tablet, Rfl: 2   rizatriptan (MAXALT) 10 MG tablet, Take 1 tablet (10 mg total) by mouth as needed for migraine, may repeat in 2 hours if needed, Disp: 10 tablet, Rfl: 5   rosuvastatin (CRESTOR) 5 MG tablet, Take 1 tablet (5 mg total) by mouth every other day., Disp: 90 tablet, Rfl: 2   valsartan (DIOVAN) 320 MG tablet, Take 1 tablet (320 mg total) by mouth daily., Disp: 90 tablet, Rfl: 2  Allergies  Allergen Reactions   Amlodipine     Headache    Kiwi Extract     Tongue and mouth burning/irritation    Review of Systems  Constitutional:  Negative for chills, fever, malaise/fatigue and weight loss.  HENT:  Negative for congestion, ear pain, hearing loss, sore throat and tinnitus.   Eyes:  Negative for blurred vision, pain and redness.  Respiratory:  Negative for cough, hemoptysis and shortness of breath.   Cardiovascular:  Negative for chest pain, palpitations, orthopnea, claudication and leg swelling.  Gastrointestinal:  Negative for abdominal pain, blood in stool, constipation, diarrhea, nausea and vomiting.  Genitourinary:  Negative for dysuria, flank pain, frequency, hematuria and urgency.  Musculoskeletal:  Negative for falls, joint pain and myalgias.  Skin:  Negative for itching and rash.       Skin  discoloration on chin  Neurological:  Negative for dizziness, tingling, speech change, weakness and headaches.  Endo/Heme/Allergies:  Negative for polydipsia. Does not bruise/bleed easily.  Psychiatric/Behavioral:  Negative for depression and memory loss. The patient is not nervous/anxious and does not have insomnia.          Objective:  BP 130/88   Pulse 61   Ht 5' 7.25" (1.708 m)   Wt 267 lb 6.4 oz (121.3 kg)   SpO2 98%   BMI 41.57 kg/m  BP Readings from Last 3 Encounters:  03/10/23 130/88  02/25/23 138/84  02/11/23  (!) 153/101   Wt Readings from Last 3 Encounters:  03/10/23 267 lb 6.4 oz (121.3 kg)  02/25/23 262 lb (118.8 kg)  02/11/23 263 lb (119.3 kg)    General appearance: alert, no distress, WD/WN, African American female Skin: unremarkable HEENT: normocephalic, conjunctiva/corneas normal, sclerae anicteric, PERRLA, EOMi Neck: supple, no lymphadenopathy, no thyromegaly, no masses, normal ROM, no bruits Chest: non tender, normal shape and expansion Heart: RRR, normal S1, S2, no murmurs Lungs: CTA bilaterally, no wheezes, rhonchi, or rales Abdomen: +bs, soft, non tender, non distended, no masses, no hepatomegaly, no splenomegaly, no bruits Back: non tender, normal ROM, no scoliosis Musculoskeletal: upper extremities non tender, no obvious deformity, normal ROM throughout, lower extremities non tender, no obvious deformity, normal ROM throughout Extremities: no edema, no cyanosis, no clubbing Pulses: 2+ symmetric, upper and lower extremities, normal cap refill Neurological: alert, oriented x 3, CN2-12 intact, strength normal upper extremities and lower extremities, sensation normal throughout, DTRs 2+ throughout, no cerebellar signs, gait normal Psychiatric: normal affect, behavior normal, pleasant  Breast/gyn/rectal - deferred to gynecology   Assessment and Plan :   Encounter Diagnoses  Name Primary?   Encounter for health maintenance examination in adult Yes   Skin abnormalities    Screening for colon cancer    Need for HPV vaccination    Screening for heart disease    Prediabetes    Other hyperlipidemia    IUD (intrauterine device) in place    Impaired fasting blood sugar    Essential hypertension, benign    Family history of stroke       Physical exam - discussed and counseled on healthy lifestyle, diet, exercise, preventative care, vaccinations, sick and well care, proper use of emergency dept and after hours care, and addressed their concerns.    Health screening: Advised  they see their eye doctor yearly for routine vision care. Advised they see their dentist yearly for routine dental care including hygiene visits twice yearly. See your gynecologist yearly for routine gynecological care.  Cancer screening Counseled on self breast exams, mammograms, cervical cancer screening  Colonoscopy:  Referral today for Cologuard   Vaccinations: Immunization History  Administered Date(s) Administered   DTaP / HiB / IPV 04/28/1978, 06/28/1978, 09/30/1978, 09/20/1980   HPV 9-valent 01/28/2022, 07/30/2022, 03/10/2023   Hepatitis A, Adult 07/30/2010   Hepatitis B, ADULT 07/30/2010, 09/01/2010   Influenza Split 09/20/2008, 10/24/2009   Influenza, Seasonal, Injecte, Preservative Fre 10/02/2022   Influenza,inj,Quad PF,6+ Mos 10/04/2019, 10/11/2020, 10/24/2021   MMR 06/16/1979, 05/02/2005, 02/22/2017   PFIZER(Purple Top)SARS-COV-2 Vaccination 01/27/2019, 02/17/2019   Pfizer Covid-19 Vaccine Bivalent Booster 14yrs & up 01/24/2021   Td 02/12/2002   Tdap 04/03/2014    Counseled on the Human Papilloma virus vaccine.  HPV vaccine given after consent obtained.        Separate significant issues discussed: Hypertension-continue current medication.  We discussed her blood pressure still somewhat borderline.  She wants to continue efforts to lose weight.  She does not want to change medication doses today.  Continue buspirone HCT 10/6.25 mg, 2 tablets daily, continue valsartan 320 mg daily  Vitamin D deficiency-restart OTC supplement  Continue efforts to lose weight through healthy diet measures and exercise.   Impaired glucose-recent labs stable, HgbA1C 6.3%, continue metformin 500 mg BID  Dyslipidemia - continue statin, Crestor 5 mg every other day, referral for CT coronary calcium test  On Mirena, IUD in place   Reviewed recent labs in chart record  Amariona was seen today for annual exam.  Diagnoses and all orders for this visit:  Encounter for health  maintenance examination in adult -     CT CARDIAC SCORING (SELF PAY ONLY); Future  Skin abnormalities -     Ambulatory referral to Dermatology  Screening for colon cancer -     Cologuard  Need for HPV vaccination -     HPV 9-valent vaccine,Recombinat  Screening for heart disease -     CT CARDIAC SCORING (SELF PAY ONLY); Future  Prediabetes  Other hyperlipidemia  IUD (intrauterine device) in place  Impaired fasting blood sugar  Essential hypertension, benign  Family history of stroke  Other orders -     bisoprolol-hydrochlorothiazide (ZIAC) 10-6.25 MG tablet; Take 2 tablets by mouth daily. -     rizatriptan (MAXALT) 10 MG tablet; Take 1 tablet (10 mg total) by mouth as needed for migraine, may repeat in 2 hours if needed -     rosuvastatin (CRESTOR) 5 MG tablet; Take 1 tablet (5 mg total) by mouth every other day. -     valsartan (DIOVAN) 320 MG tablet; Take 1 tablet (320 mg total) by mouth daily.   Follow-up pending labs, yearly for physical

## 2023-03-16 ENCOUNTER — Encounter: Payer: Self-pay | Admitting: Obstetrics and Gynecology

## 2023-03-17 ENCOUNTER — Ambulatory Visit (INDEPENDENT_AMBULATORY_CARE_PROVIDER_SITE_OTHER): Payer: Commercial Managed Care - PPO | Admitting: Internal Medicine

## 2023-03-17 ENCOUNTER — Other Ambulatory Visit (HOSPITAL_COMMUNITY): Payer: Self-pay

## 2023-03-18 ENCOUNTER — Encounter (INDEPENDENT_AMBULATORY_CARE_PROVIDER_SITE_OTHER): Payer: Self-pay | Admitting: Internal Medicine

## 2023-03-18 ENCOUNTER — Ambulatory Visit (INDEPENDENT_AMBULATORY_CARE_PROVIDER_SITE_OTHER): Admitting: Internal Medicine

## 2023-03-18 VITALS — BP 146/95 | HR 60 | Temp 97.9°F | Ht 66.5 in | Wt 260.0 lb

## 2023-03-18 DIAGNOSIS — I10 Essential (primary) hypertension: Secondary | ICD-10-CM | POA: Diagnosis not present

## 2023-03-18 DIAGNOSIS — E66813 Obesity, class 3: Secondary | ICD-10-CM | POA: Diagnosis not present

## 2023-03-18 DIAGNOSIS — R7303 Prediabetes: Secondary | ICD-10-CM

## 2023-03-18 DIAGNOSIS — Z6841 Body Mass Index (BMI) 40.0 and over, adult: Secondary | ICD-10-CM

## 2023-03-18 NOTE — Assessment & Plan Note (Signed)
 On metformin 500 mg twice daily, increased during the last visit. Reports good tolerance with no significant side effects. Goal is to manage blood sugar levels through diet, exercise, and medication. - Continue metformin 500 mg twice daily -Continue diet with a low glycemic load

## 2023-03-18 NOTE — Assessment & Plan Note (Signed)
 Currently on bisoprolol hydrochlorothiazide and Diovan. Reports normal blood pressure readings at home but elevated readings in the clinic, suggesting possible labile hypertension. Discussed consistent blood pressure monitoring and risks of untreated hypertension, including stroke. Explained that optimal blood pressure is less than 120/80 mmHg and elevated levels increase the risk of stroke and kidney disease. Discussed potential need for additional medication if blood pressure remains high. - Split blood pressure medications: take bisoprolol hydrochlorothiazide with metformin in the morning and Diovan with metformin in the evening - Monitor blood pressure at home: check in the morning and before bedtime - If blood pressure remains above 140/90, consider adding low-dose amlodipine -Follow-up with primary care team

## 2023-03-18 NOTE — Assessment & Plan Note (Signed)
 Presents for medical weight management. Following a low-carb diet, tracking calories, maintaining hydration, and exercising regularly. Increased meals to two per day, reports improved energy levels and reduced cravings for sweets.  Body composition demonstrating improvements.  Goal is gradual weight loss of 1-2 pounds per week. Discussed balanced nutrition, portion control, hydration, regular exercise, and adequate sleep. Emphasized a holistic approach to health, including fiber, healthy fats, vegetables, and fruits.  Counseled on managing expectations - Continue current diet and exercise regimen - Congratulated on positive changes since last office visit - Provide handout on top ten principles for weight loss - Schedule follow-up in three weeks -Continue metformin for diabetes prevention and weight management.

## 2023-03-18 NOTE — Progress Notes (Signed)
 Office: [phone]  /  Fax: [phone]  Weight Summary And Biometrics  Vitals Temp: 97.9 F (36.6 C) BP: (!) 146/95 Pulse Rate: 60 SpO2: 99 %   Anthropometric Measurements Height: 5' 6.5" (1.689 m) Weight: 260 lb (117.9 kg) BMI (Calculated): 41.34 Weight at Last Visit: 262 lb Weight Lost Since Last Visit: 2 lb Weight Gained Since Last Visit: 0 Starting Weight: 258 lb Total Weight Loss (lbs): 0 lb (0 kg) Peak Weight: 272 lb   Body Composition  Body Fat %: 47.2 % Fat Mass (lbs): 122.8 lbs Muscle Mass (lbs): 130.2 lbs Total Body Water (lbs): 90.4 lbs Visceral Fat Rating : 14    No data recorded Today's Visit #: 8  Starting Date: 08/24/22   Subjective   Chief Complaint: Obesity  Interval History Discussed the use of AI scribe software for clinical note transcription with the patient, who gave verbal consent to proceed.  History of Present Illness   Christine Fields is a 43 year old female with obesity, hypertension, and prediabetes who presents for medical weight management. She was a former patient of Nurse, adult.  She is actively engaged in a medical weight management program due to her history of obesity. She has transitioned from eating one meal a day to following a low-carb diet plan, which she adheres to consistently. She tracks her caloric intake, ensures adequate protein consumption, maintains hydration, and has increased her meals to two per day. Her exercise routine includes three to four sessions per week, comprising strength training, cardio exercises, and yoga. She experiences work-related stress but reports increased energy levels and decreased fatigue. She has lost weight and notes a reduction in cravings for sweets, opting for fruits like apples or oranges instead.  She has a history of hypertension and is currently on bisoprolol hydrochlorothiazide and Diovan. Her blood pressure readings at home are generally normal, but they tend to be  elevated during medical visits, suggesting possible white coat hypertension. Her home blood pressure reading was 127/88 mmHg. She takes all her medications in the morning.  She has prediabetes and is on metformin, which was increased to one tablet twice a day during her last visit. She tolerates the medication well without significant side effects and has noticed a reduction in cravings for sweets.        Challenges affecting patient progress: none.    Pharmacotherapy for weight management: She is currently taking Metformin (off label use for incretin effect and / or insulin resistance and / or diabetes prevention) with adequate clinical response  and without side effects..   Assessment and Plan   Treatment Plan For Obesity:  Recommended Dietary Goals  Christine Fields is currently in the action stage of change. As such, her goal is to continue weight management plan. She has agreed to: continue current plan  Behavioral Health and Counseling  We discussed the following behavioral modification strategies today: continue to work on maintaining a reduced calorie state, getting the recommended amount of protein, incorporating whole foods, making healthy choices, staying well hydrated and practicing mindfulness when eating..  Additional education and resources provided today: None  Recommended Physical Activity Goals  Christine Fields has been advised to work up to 150 minutes of moderate intensity aerobic activity a week and strengthening exercises 2-3 times per week for cardiovascular health, weight loss maintenance and preservation of muscle mass.   She has agreed to :  Think about enjoyable ways to increase daily physical activity and overcoming barriers to exercise and Increase physical  activity in their day and reduce sedentary time (increase NEAT).  Pharmacotherapy  We discussed various medication options to help Christine Fields with her weight loss efforts and we both agreed to : adequate clinical response  to current dose, continue current regimen  Associated Conditions Impacted by Obesity Treatment  Essential hypertension, benign Assessment & Plan: Currently on bisoprolol hydrochlorothiazide and Diovan. Reports normal blood pressure readings at home but elevated readings in the clinic, suggesting possible labile hypertension. Discussed consistent blood pressure monitoring and risks of untreated hypertension, including stroke. Explained that optimal blood pressure is less than 120/80 mmHg and elevated levels increase the risk of stroke and kidney disease. Discussed potential need for additional medication if blood pressure remains high. - Split blood pressure medications: take bisoprolol hydrochlorothiazide with metformin in the morning and Diovan with metformin in the evening - Monitor blood pressure at home: check in the morning and before bedtime - If blood pressure remains above 140/90, consider adding low-dose amlodipine -Follow-up with primary care team   Prediabetes Assessment & Plan: On metformin 500 mg twice daily, increased during the last visit. Reports good tolerance with no significant side effects. Goal is to manage blood sugar levels through diet, exercise, and medication. - Continue metformin 500 mg twice daily -Continue diet with a low glycemic load    Class 3 severe obesity with serious comorbidity and body mass index (BMI) of 40.0 to 44.9 in adult, unspecified obesity type [name]) Assessment & Plan: Presents for medical weight management. Following a low-carb diet, tracking calories, maintaining hydration, and exercising regularly. Increased meals to two per day, reports improved energy levels and reduced cravings for sweets.  Body composition demonstrating improvements.  Goal is gradual weight loss of 1-2 pounds per week. Discussed balanced nutrition, portion control, hydration, regular exercise, and adequate sleep. Emphasized a holistic approach to health, including fiber,  healthy fats, vegetables, and fruits.  Counseled on managing expectations - Continue current diet and exercise regimen - Congratulated on positive changes since last office visit - Provide handout on top ten principles for weight loss - Schedule follow-up in three weeks -Continue metformin for diabetes prevention and weight management.       General Health Maintenance Discussed balanced nutrition, portion control, hydration, regular exercise, and adequate sleep. Emphasized a holistic approach to health, including fiber, healthy fats, vegetables, and fruits. - Provide handout on balanced nutrition and portion control - Encourage regular exercise and adequate sleep - Advise on reducing sodium intake and avoiding prepackaged processed foods  Follow-up - Schedule follow-up appointment in three weeks.          Objective   Physical Exam:  Blood pressure (!) 146/95, pulse 60, temperature 97.9 F (36.6 C), height 5' 6.5" (1.689 m), weight 260 lb (117.9 kg), SpO2 99%. Body mass index is 41.34 kg/m.  General: She is overweight, cooperative, alert, well developed, and in no acute distress. PSYCH: Has normal mood, affect and thought process.   HEENT: EOMI, sclerae are anicteric. Lungs: Normal breathing effort, no conversational dyspnea. Extremities: No edema.  Neurologic: No gross sensory or motor deficits. No tremors or fasciculations noted.    Diagnostic Data Reviewed:  BMET    Component Value Date/Time   NA 139 12/08/2022 0855   K 4.2 12/08/2022 0855   CL 103 12/08/2022 0855   CO2 22 12/08/2022 0855   GLUCOSE 110 (H) 12/08/2022 0855   GLUCOSE 108 (H) 07/11/2019 1124   BUN 10 12/08/2022 0855   CREATININE 0.77 12/08/2022 0855   CALCIUM  9.3 12/08/2022 0855   GFRNONAA 57 (L) 11/10/2019 1519   GFRAA 66 11/10/2019 1519   Lab Results  Component Value Date   HGBA1C 6.3 (H) 12/08/2022   HGBA1C 5.7 (H) 05/24/2017   Lab Results  Component Value Date   INSULIN 13.7  12/08/2022   INSULIN 18.4 05/03/2019   Lab Results  Component Value Date   TSH 3.860 09/07/2022   CBC    Component Value Date/Time   WBC 7.7 01/28/2022 1043   WBC 13.6 (H) 05/16/2011 2140   RBC 4.98 01/28/2022 1043   RBC 4.75 05/16/2011 2140   HGB 14.1 01/28/2022 1043   HCT 42.7 01/28/2022 1043   PLT 219 01/28/2022 1043   MCV 86 01/28/2022 1043   MCH 28.3 01/28/2022 1043   MCH 29.1 05/16/2011 2140   MCHC 33.0 01/28/2022 1043   MCHC 34.2 05/16/2011 2140   RDW 12.8 01/28/2022 1043   Iron Studies No results found for: "IRON", "TIBC", "FERRITIN", "IRONPCTSAT" Lipid Panel     Component Value Date/Time   CHOL 169 08/24/2022 0847   TRIG 81 08/24/2022 0847   HDL 37 (L) 08/24/2022 0847   CHOLHDL 5.4 (H) 01/28/2022 1043   LDLCALC 117 (H) 08/24/2022 0847   LDLDIRECT 103 (H) 11/10/2019 1519   Hepatic Function Panel     Component Value Date/Time   PROT 6.6 12/08/2022 0855   ALBUMIN 4.4 12/08/2022 0855   AST 26 12/08/2022 0855   ALT 23 12/08/2022 0855   ALKPHOS 67 12/08/2022 0855   BILITOT 0.8 12/08/2022 0855      Component Value Date/Time   TSH 3.860 09/07/2022 1552   Nutritional Lab Results  Component Value Date   VD25OH 55.0 12/08/2022   VD25OH 36.3 08/24/2022   VD25OH 30.1 11/12/2020    Medications: Outpatient Encounter Medications as of 03/18/2023  Medication Sig   bisoprolol-hydrochlorothiazide (ZIAC) 10-6.25 MG tablet Take 2 tablets by mouth daily.   desloratadine (CLARINEX) 5 MG tablet Take 1 tablet (5 mg total) by mouth daily.   ipratropium (ATROVENT) 0.03 % nasal spray Place 2 sprays into both nostrils every 12 (twelve) hours.   levonorgestrel (MIRENA, 52 MG,) 20 MCG/24HR IUD    metFORMIN (GLUCOPHAGE) 500 MG tablet Take 1 tablet (500 mg total) by mouth 2 (two) times daily with a meal.   rizatriptan (MAXALT) 10 MG tablet Take 1 tablet (10 mg total) by mouth as needed for migraine, may repeat in 2 hours if needed   rosuvastatin (CRESTOR) 5 MG tablet Take 1  tablet (5 mg total) by mouth every other day.   valsartan (DIOVAN) 320 MG tablet Take 1 tablet (320 mg total) by mouth daily.   No facility-administered encounter medications on file as of 03/18/2023.     Follow-Up   Return in about 3 weeks (around 04/08/2023) for Dr. Dalbert Garnet.Marland Kitchen She was informed of the importance of frequent follow up visits to maximize her success with intensive lifestyle modifications for her multiple health conditions.  Attestation Statement   Reviewed by clinician on day of visit: allergies, medications, problem list, medical history, surgical history, family history, social history, and previous encounter notes.     Worthy Rancher, MD

## 2023-03-19 ENCOUNTER — Ambulatory Visit (HOSPITAL_COMMUNITY): Payer: Commercial Managed Care - PPO

## 2023-03-24 DIAGNOSIS — Z1211 Encounter for screening for malignant neoplasm of colon: Secondary | ICD-10-CM | POA: Diagnosis not present

## 2023-03-31 LAB — COLOGUARD: COLOGUARD: NEGATIVE

## 2023-03-31 NOTE — Progress Notes (Signed)
 Results sent through MyChart

## 2023-04-12 ENCOUNTER — Other Ambulatory Visit (HOSPITAL_COMMUNITY): Payer: Self-pay

## 2023-04-12 ENCOUNTER — Ambulatory Visit (INDEPENDENT_AMBULATORY_CARE_PROVIDER_SITE_OTHER): Payer: Commercial Managed Care - PPO | Admitting: Internal Medicine

## 2023-04-12 VITALS — BP 138/84 | HR 54 | Temp 97.5°F | Ht 66.5 in | Wt 261.0 lb

## 2023-04-12 DIAGNOSIS — E66813 Obesity, class 3: Secondary | ICD-10-CM

## 2023-04-12 DIAGNOSIS — I10 Essential (primary) hypertension: Secondary | ICD-10-CM | POA: Diagnosis not present

## 2023-04-12 DIAGNOSIS — Z6841 Body Mass Index (BMI) 40.0 and over, adult: Secondary | ICD-10-CM | POA: Diagnosis not present

## 2023-04-12 DIAGNOSIS — R7303 Prediabetes: Secondary | ICD-10-CM | POA: Diagnosis not present

## 2023-04-12 MED ORDER — METFORMIN HCL 500 MG PO TABS
500.0000 mg | ORAL_TABLET | Freq: Two times a day (BID) | ORAL | 0 refills | Status: DC
  Filled 2023-04-12: qty 60, 30d supply, fill #0

## 2023-04-12 NOTE — Assessment & Plan Note (Addendum)
 Blood pressure control improving but still not at goal currently on bisoprolol, hydrochlorothiazide and valsartan.  Losing 10% of body weight may improve blood pressure control, continue current regimen.

## 2023-04-12 NOTE — Assessment & Plan Note (Signed)
 Most recent A1c is  Lab Results  Component Value Date   HGBA1C 6.3 (H) 12/08/2022   HGBA1C 5.7 (H) 05/24/2017    Patient aware of disease state and risk of progression. This may contribute to abnormal cravings, fatigue and diabetic complications without having diabetes.   We have discussed treatment options which include: losing 7 to 10% of body weight, increasing physical activity to a goal of 150 minutes a week at moderate intensity.  Advised to maintain a diet low on simple and processed carbohydrates.  Currently on metformin 1 tablet twice daily for pharmacoprophylaxis without adverse effects.  Continue current regimen

## 2023-04-12 NOTE — Assessment & Plan Note (Addendum)
 Patient is following ad libitum low-carb plan she is also tracking and journaling but despite her efforts she is having difficulty losing weight.  She remembers in the past losing weight to increase volume of physical activity.  I would like for her to focus on doing just that increasing volume and intensity of her workouts.  I do not think she is a good candidate for antiobesity medication, as she has adequate satiety and satiation.  I suspect she may have diet resistant obesity due to lack of response to what appears to be an adequate calorie deficit.  She also has problems with chronic skipping of meals and is working hard on increasing her meals to 3 a day.  We also discussed maintaining a protein intake of about 40 g per meal.

## 2023-04-12 NOTE — Progress Notes (Deleted)
 Office: [phone]  /  Fax: [phone]  Weight Summary And Biometrics  Vitals Temp: (!) 97.5 F (36.4 C) BP: 138/84 Pulse Rate: (!) 54 SpO2: 99 %   Anthropometric Measurements Height: 5' 6.5" (1.689 m) Weight: 261 lb (118.4 kg) BMI (Calculated): 41.5 Weight at Last Visit: 260 lb Weight Lost Since Last Visit: 0 lb Weight Gained Since Last Visit: 1 lb Starting Weight: 258 lb Total Weight Loss (lbs): 0 lb (0 kg) Peak Weight: 272   Body Composition  Body Fat %: 48.5 % Fat Mass (lbs): 126.6 lbs Muscle Mass (lbs): 127.8 lbs Total Body Water (lbs): 97.6 lbs Visceral Fat Rating : 14    No data recorded Today's Visit #: 9  Starting Date: 08/24/22   Subjective   Chief Complaint: Obesity  Interval History Discussed the use of AI scribe software for clinical note transcription with the patient, who gave verbal consent to proceed.  History of Present Illness     Challenges affecting patient progress: {EMOBESITYBARRIERS:28841::"none"}.    Pharmacotherapy for weight management: She is currently taking {EMPharmaco:28845}.   Assessment and Plan   Treatment Plan For Obesity:  Recommended Dietary Goals  Isis is currently in the action stage of change. As such, her goal is to continue weight management plan. She has agreed to: {EMWTLOSSPLAN:29297::"continue current plan"}  Behavioral Health and Counseling  We discussed the following behavioral modification strategies today: {EMWMwtlossstrategies:28914::"continue to work on maintaining a reduced calorie state, getting the recommended amount of protein, incorporating whole foods, making healthy choices, staying well hydrated and practicing mindfulness when eating."}.  Additional education and resources provided today: {EMadditionalresources:29169::"None"}  Recommended Physical Activity Goals  Collyn has been advised to work up to 150 minutes of moderate intensity aerobic activity a week and strengthening  exercises 2-3 times per week for cardiovascular health, weight loss maintenance and preservation of muscle mass.   She has agreed to :  {EMEXERCISE:28847::"Think about enjoyable ways to increase daily physical activity and overcoming barriers to exercise","Increase physical activity in their day and reduce sedentary time (increase NEAT)."}  Pharmacotherapy  We discussed various medication options to help Alexa with her weight loss efforts and we both agreed to : {EMagreedrx:29170}  Associated Conditions Impacted by Obesity Treatment  Prediabetes    Assessment and Plan Assessment & Plan       Objective   Physical Exam:  Blood pressure 138/84, pulse (!) 54, temperature (!) 97.5 F (36.4 C), height 5' 6.5" (1.689 m), weight 261 lb (118.4 kg), SpO2 99%. Body mass index is 41.5 kg/m.  General: She is overweight, cooperative, alert, well developed, and in no acute distress. PSYCH: Has normal mood, affect and thought process.   HEENT: EOMI, sclerae are anicteric. Lungs: Normal breathing effort, no conversational dyspnea. Extremities: No edema.  Neurologic: No gross sensory or motor deficits. No tremors or fasciculations noted.    Diagnostic Data Reviewed:  BMET    Component Value Date/Time   NA 139 12/08/2022 0855   K 4.2 12/08/2022 0855   CL 103 12/08/2022 0855   CO2 22 12/08/2022 0855   GLUCOSE 110 (H) 12/08/2022 0855   GLUCOSE 108 (H) 07/11/2019 1124   BUN 10 12/08/2022 0855   CREATININE 0.77 12/08/2022 0855   CALCIUM 9.3 12/08/2022 0855   GFRNONAA 57 (L) 11/10/2019 1519   GFRAA 66 11/10/2019 1519   Lab Results  Component Value Date   HGBA1C 6.3 (H) 12/08/2022   HGBA1C 5.7 (H) 05/24/2017   Lab Results  Component Value Date  INSULIN 13.7 12/08/2022   INSULIN 18.4 05/03/2019   Lab Results  Component Value Date   TSH 3.860 09/07/2022   CBC    Component Value Date/Time   WBC 7.7 01/28/2022 1043   WBC 13.6 (H) 05/16/2011 2140   RBC 4.98 01/28/2022  1043   RBC 4.75 05/16/2011 2140   HGB 14.1 01/28/2022 1043   HCT 42.7 01/28/2022 1043   PLT 219 01/28/2022 1043   MCV 86 01/28/2022 1043   MCH 28.3 01/28/2022 1043   MCH 29.1 05/16/2011 2140   MCHC 33.0 01/28/2022 1043   MCHC 34.2 05/16/2011 2140   RDW 12.8 01/28/2022 1043   Iron Studies No results found for: "IRON", "TIBC", "FERRITIN", "IRONPCTSAT" Lipid Panel     Component Value Date/Time   CHOL 169 08/24/2022 0847   TRIG 81 08/24/2022 0847   HDL 37 (L) 08/24/2022 0847   CHOLHDL 5.4 (H) 01/28/2022 1043   LDLCALC 117 (H) 08/24/2022 0847   LDLDIRECT 103 (H) 11/10/2019 1519   Hepatic Function Panel     Component Value Date/Time   PROT 6.6 12/08/2022 0855   ALBUMIN 4.4 12/08/2022 0855   AST 26 12/08/2022 0855   ALT 23 12/08/2022 0855   ALKPHOS 67 12/08/2022 0855   BILITOT 0.8 12/08/2022 0855      Component Value Date/Time   TSH 3.860 09/07/2022 1552   Nutritional Lab Results  Component Value Date   VD25OH 55.0 12/08/2022   VD25OH 36.3 08/24/2022   VD25OH 30.1 11/12/2020    Medications: Outpatient Encounter Medications as of 04/12/2023  Medication Sig   bisoprolol-hydrochlorothiazide (ZIAC) 10-6.25 MG tablet Take 2 tablets by mouth daily.   desloratadine (CLARINEX) 5 MG tablet Take 1 tablet (5 mg total) by mouth daily.   ipratropium (ATROVENT) 0.03 % nasal spray Place 2 sprays into both nostrils every 12 (twelve) hours.   levonorgestrel (MIRENA, 52 MG,) 20 MCG/24HR IUD    rizatriptan (MAXALT) 10 MG tablet Take 1 tablet (10 mg total) by mouth as needed for migraine, may repeat in 2 hours if needed   rosuvastatin (CRESTOR) 5 MG tablet Take 1 tablet (5 mg total) by mouth every other day.   valsartan (DIOVAN) 320 MG tablet Take 1 tablet (320 mg total) by mouth daily.   metFORMIN (GLUCOPHAGE) 500 MG tablet Take 1 tablet (500 mg total) by mouth 2 (two) times daily with a meal.   No facility-administered encounter medications on file as of 04/12/2023.     Follow-Up    No follow-ups on file.Marland Kitchen She was informed of the importance of frequent follow up visits to maximize her success with intensive lifestyle modifications for her multiple health conditions.  Attestation Statement   Reviewed by clinician on day of visit: allergies, medications, problem list, medical history, surgical history, family history, social history, and previous encounter notes.     Worthy Rancher, MD

## 2023-04-12 NOTE — Progress Notes (Signed)
 Office: [phone]  /  Fax: [phone]  Weight Summary And Biometrics  Vitals Temp: (!) 97.5 F (36.4 C) BP: 138/84 Pulse Rate: (!) 54 SpO2: 99 %   Anthropometric Measurements Height: 5' 6.5" (1.689 m) Weight: 261 lb (118.4 kg) BMI (Calculated): 41.5 Weight at Last Visit: 260 lb Weight Lost Since Last Visit: 0 lb Weight Gained Since Last Visit: 1 lb Starting Weight: 258 lb Total Weight Loss (lbs): 0 lb (0 kg) Peak Weight: 272   Body Composition  Body Fat %: 48.5 % Fat Mass (lbs): 126.6 lbs Muscle Mass (lbs): 127.8 lbs Total Body Water (lbs): 97.6 lbs Visceral Fat Rating : 14    No data recorded Today's Visit #: 9  Starting Date: 08/24/22   Subjective   Chief Complaint: Obesity  Christine Fields is here to discuss her progress with her obesity treatment plan. She is on the following a lower carbohydrate, vegetable and lean protein rich diet plan and states she is following her eating plan approximately 60-70% of the time. She states she is exercising 45 minutes 3 times per week.  Weight Progress Since Last Visit:  Since last office visit she has gained 1 pounds. She reports good adherence to reduced calorie nutritional plan. She has been working on reading food labels, not skipping meals, increasing protein intake at every meal, eating more vegetables, drinking more water, avoiding and / or reducing liquid calories, avoiding or reducing simple and processed carbohydrates, journaling and tracking calories, making healthier choices, continues to exercise, reducing portion sizes, and incorporating more whole foods   Challenges affecting patient progress:  Limited response to calorie restriction .   Orexigenic Control: Denies problems with appetite and hunger signals.  Denies problems with satiety and satiation.  Denies problems with eating patterns and portion control.  Denies abnormal cravings. Denies feeling deprived or restricted.   Pharmacotherapy for weight  management: She is currently taking Metformin with diabetes as primary indication with adequate clinical response  and without side effects..   Assessment and Plan   Treatment Plan For Obesity:  Recommended Dietary Goals  Christine Fields is currently in the action stage of change. As such, her goal is to continue weight management plan. She has agreed to: continue current plan  Behavioral Health and Counseling  We discussed the following behavioral modification strategies today: continue to work on maintaining a reduced calorie state, getting the recommended amount of protein, incorporating whole foods, making healthy choices, staying well hydrated and practicing mindfulness when eating..  Additional education and resources provided today: Handout and personalized instruction on tracking and journaling using Apps  Recommended Physical Activity Goals  Christine Fields has been advised to work up to 150 minutes of moderate intensity aerobic activity a week and strengthening exercises 2-3 times per week for cardiovascular health, weight loss maintenance and preservation of muscle mass.   She has agreed to :  Increase the intensity, frequency or duration of strengthening exercises  and Increase the intensity, frequency or duration of aerobic exercises    Pharmacotherapy  We discussed various medication options to help Christine Fields with her weight loss efforts and we both agreed to : adequate clinical response to current dose, continue current regimen  Associated Conditions Impacted by Obesity Treatment  Class 3 severe obesity with serious comorbidity and body mass index (BMI) of 40.0 to 44.9 in adult, unspecified obesity type [facility]) Assessment & Plan: Patient is following ad libitum low-carb plan she is also tracking and journaling but despite her efforts she is having difficulty  losing weight.  She remembers in the past losing weight to increase volume of physical activity.  I would like for her to focus on  doing just that increasing volume and intensity of her workouts.  I do not think she is a good candidate for antiobesity medication, as she has adequate satiety and satiation.  I suspect she may have diet resistant obesity due to lack of response to what appears to be an adequate calorie deficit.  She also has problems with chronic skipping of meals and is working hard on increasing her meals to 3 a day.  We also discussed maintaining a protein intake of about 40 g per meal.   Prediabetes Assessment & Plan: Most recent A1c is  Lab Results  Component Value Date   HGBA1C 6.3 (H) 12/08/2022   HGBA1C 5.7 (H) 05/24/2017    Patient aware of disease state and risk of progression. This may contribute to abnormal cravings, fatigue and diabetic complications without having diabetes.   We have discussed treatment options which include: losing 7 to 10% of body weight, increasing physical activity to a goal of 150 minutes a week at moderate intensity.  Advised to maintain a diet low on simple and processed carbohydrates.  Currently on metformin 1 tablet twice daily for pharmacoprophylaxis without adverse effects.  Continue current regimen    Orders: -     metFORMIN HCl; Take 1 tablet (500 mg total) by mouth 2 (two) times daily with a meal.  Dispense: 60 tablet; Refill: 0  Essential hypertension, benign Assessment & Plan: Blood pressure control improving but still not at goal currently on bisoprolol, hydrochlorothiazide and valsartan.  Losing 10% of body weight may improve blood pressure control, continue current regimen.        Objective   Physical Exam:  Blood pressure 138/84, pulse (!) 54, temperature (!) 97.5 F (36.4 C), height 5' 6.5" (1.689 m), weight 261 lb (118.4 kg), SpO2 99%. Body mass index is 41.5 kg/m.  General: She is overweight, cooperative, alert, well developed, and in no acute distress. PSYCH: Has normal mood, affect and thought process.   HEENT: EOMI, sclerae are  anicteric. Lungs: Normal breathing effort, no conversational dyspnea. Extremities: No edema.  Neurologic: No gross sensory or motor deficits. No tremors or fasciculations noted.    Diagnostic Data Reviewed:  BMET    Component Value Date/Time   NA 139 12/08/2022 0855   K 4.2 12/08/2022 0855   CL 103 12/08/2022 0855   CO2 22 12/08/2022 0855   GLUCOSE 110 (H) 12/08/2022 0855   GLUCOSE 108 (H) 07/11/2019 1124   BUN 10 12/08/2022 0855   CREATININE 0.77 12/08/2022 0855   CALCIUM 9.3 12/08/2022 0855   GFRNONAA 57 (L) 11/10/2019 1519   GFRAA 66 11/10/2019 1519   Lab Results  Component Value Date   HGBA1C 6.3 (H) 12/08/2022   HGBA1C 5.7 (H) 05/24/2017   Lab Results  Component Value Date   INSULIN 13.7 12/08/2022   INSULIN 18.4 05/03/2019   Lab Results  Component Value Date   TSH 3.860 09/07/2022   CBC    Component Value Date/Time   WBC 7.7 01/28/2022 1043   WBC 13.6 (H) 05/16/2011 2140   RBC 4.98 01/28/2022 1043   RBC 4.75 05/16/2011 2140   HGB 14.1 01/28/2022 1043   HCT 42.7 01/28/2022 1043   PLT 219 01/28/2022 1043   MCV 86 01/28/2022 1043   MCH 28.3 01/28/2022 1043   MCH 29.1 05/16/2011 2140   MCHC 33.0  01/28/2022 1043   MCHC 34.2 05/16/2011 2140   RDW 12.8 01/28/2022 1043   Iron Studies No results found for: "IRON", "TIBC", "FERRITIN", "IRONPCTSAT" Lipid Panel     Component Value Date/Time   CHOL 169 08/24/2022 0847   TRIG 81 08/24/2022 0847   HDL 37 (L) 08/24/2022 0847   CHOLHDL 5.4 (H) 01/28/2022 1043   LDLCALC 117 (H) 08/24/2022 0847   LDLDIRECT 103 (H) 11/10/2019 1519   Hepatic Function Panel     Component Value Date/Time   PROT 6.6 12/08/2022 0855   ALBUMIN 4.4 12/08/2022 0855   AST 26 12/08/2022 0855   ALT 23 12/08/2022 0855   ALKPHOS 67 12/08/2022 0855   BILITOT 0.8 12/08/2022 0855      Component Value Date/Time   TSH 3.860 09/07/2022 1552   Nutritional Lab Results  Component Value Date   VD25OH 55.0 12/08/2022   VD25OH 36.3  08/24/2022   VD25OH 30.1 11/12/2020    Medications: Outpatient Encounter Medications as of 04/12/2023  Medication Sig   bisoprolol-hydrochlorothiazide (ZIAC) 10-6.25 MG tablet Take 2 tablets by mouth daily.   desloratadine (CLARINEX) 5 MG tablet Take 1 tablet (5 mg total) by mouth daily.   ipratropium (ATROVENT) 0.03 % nasal spray Place 2 sprays into both nostrils every 12 (twelve) hours.   levonorgestrel (MIRENA, 52 MG,) 20 MCG/24HR IUD    rizatriptan (MAXALT) 10 MG tablet Take 1 tablet (10 mg total) by mouth as needed for migraine, may repeat in 2 hours if needed   rosuvastatin (CRESTOR) 5 MG tablet Take 1 tablet (5 mg total) by mouth every other day.   valsartan (DIOVAN) 320 MG tablet Take 1 tablet (320 mg total) by mouth daily.   metFORMIN (GLUCOPHAGE) 500 MG tablet Take 1 tablet (500 mg total) by mouth 2 (two) times daily with a meal.   [DISCONTINUED] metFORMIN (GLUCOPHAGE) 500 MG tablet Take 1 tablet (500 mg total) by mouth 2 (two) times daily with a meal.   No facility-administered encounter medications on file as of 04/12/2023.     Follow-Up   Return in about 4 weeks (around 05/10/2023) for For Weight Mangement with Dr. Rikki Spearing.Marland Kitchen She was informed of the importance of frequent follow up visits to maximize her success with intensive lifestyle modifications for her multiple health conditions.  Attestation Statement   Reviewed by clinician on day of visit: allergies, medications, problem list, medical history, surgical history, family history, social history, and previous encounter notes.     Worthy Rancher, MD

## 2023-04-16 ENCOUNTER — Other Ambulatory Visit (HOSPITAL_COMMUNITY): Payer: Self-pay

## 2023-04-30 ENCOUNTER — Other Ambulatory Visit (HOSPITAL_BASED_OUTPATIENT_CLINIC_OR_DEPARTMENT_OTHER): Payer: Commercial Managed Care - PPO

## 2023-05-04 ENCOUNTER — Ambulatory Visit (INDEPENDENT_AMBULATORY_CARE_PROVIDER_SITE_OTHER): Admitting: Internal Medicine

## 2023-06-08 ENCOUNTER — Ambulatory Visit (INDEPENDENT_AMBULATORY_CARE_PROVIDER_SITE_OTHER): Admitting: Internal Medicine

## 2023-06-08 ENCOUNTER — Encounter (INDEPENDENT_AMBULATORY_CARE_PROVIDER_SITE_OTHER): Payer: Self-pay

## 2023-06-10 ENCOUNTER — Other Ambulatory Visit (HOSPITAL_COMMUNITY): Payer: Self-pay

## 2023-06-10 ENCOUNTER — Other Ambulatory Visit (INDEPENDENT_AMBULATORY_CARE_PROVIDER_SITE_OTHER): Payer: Self-pay | Admitting: Internal Medicine

## 2023-06-10 ENCOUNTER — Ambulatory Visit (INDEPENDENT_AMBULATORY_CARE_PROVIDER_SITE_OTHER): Admitting: Internal Medicine

## 2023-06-10 ENCOUNTER — Other Ambulatory Visit: Payer: Self-pay | Admitting: Medical

## 2023-06-10 DIAGNOSIS — I10 Essential (primary) hypertension: Secondary | ICD-10-CM

## 2023-06-10 DIAGNOSIS — R7303 Prediabetes: Secondary | ICD-10-CM

## 2023-06-11 ENCOUNTER — Encounter (HOSPITAL_COMMUNITY): Payer: Self-pay

## 2023-06-11 ENCOUNTER — Other Ambulatory Visit (HOSPITAL_COMMUNITY): Payer: Self-pay

## 2023-06-14 ENCOUNTER — Other Ambulatory Visit (HOSPITAL_COMMUNITY): Payer: Self-pay

## 2023-06-14 ENCOUNTER — Ambulatory Visit (INDEPENDENT_AMBULATORY_CARE_PROVIDER_SITE_OTHER): Admitting: Internal Medicine

## 2023-06-14 ENCOUNTER — Encounter (INDEPENDENT_AMBULATORY_CARE_PROVIDER_SITE_OTHER): Payer: Self-pay | Admitting: Internal Medicine

## 2023-06-14 VITALS — BP 136/89 | HR 59 | Temp 98.2°F | Ht 66.5 in | Wt 261.0 lb

## 2023-06-14 DIAGNOSIS — Z6841 Body Mass Index (BMI) 40.0 and over, adult: Secondary | ICD-10-CM

## 2023-06-14 DIAGNOSIS — E66813 Obesity, class 3: Secondary | ICD-10-CM | POA: Diagnosis not present

## 2023-06-14 DIAGNOSIS — R7303 Prediabetes: Secondary | ICD-10-CM

## 2023-06-14 MED ORDER — METFORMIN HCL 500 MG PO TABS
500.0000 mg | ORAL_TABLET | Freq: Two times a day (BID) | ORAL | 0 refills | Status: DC
  Filled 2023-06-14: qty 120, 60d supply, fill #0

## 2023-06-14 NOTE — Progress Notes (Signed)
 Office: [phone]  /  Fax: [phone]  Weight Summary And Biometrics  Vitals Temp: 98.2 F (36.8 C) BP: 136/89 Pulse Rate: (!) 59 SpO2: 96 %   Anthropometric Measurements Height: 5' 6.5" (1.689 m) Weight: 261 lb (118.4 kg) BMI (Calculated): 41.5 Weight at Last Visit: 257 lb Weight Lost Since Last Visit: 4 lb Weight Gained Since Last Visit: 0 lb Starting Weight: 258 lb Total Weight Loss (lbs): 0 lb (0 kg) Peak Weight: 272 lb   Body Composition  Body Fat %: 45.8 % Fat Mass (lbs): 117.8 lbs Muscle Mass (lbs): 132.4 lbs Total Body Water (lbs): 93.2 lbs Visceral Fat Rating : 13    No data recorded Today's Visit #: 10  Starting Date: 04/12/23   Subjective   Chief Complaint: Obesity  Interval History Discussed the use of AI scribe software for clinical note transcription with the patient, who gave verbal consent to proceed.  History of Present Illness   Christine Fields is a 43 year old female with Type 2 Diabetes Mellitus who presents for medical weight management.  She has lost four pounds since her last visit and adheres to a low-carb diet plan approximately 90% of the time. She tracks her caloric intake, consumes more whole foods, ensures adequate protein intake, maintains hydration, and avoids skipping meals. Family gatherings present challenges due to high-carb foods, but she manages to stick to meats and vegetables, avoiding rice and potatoes, and often opts for lettuce wraps instead of traditional wraps. She has eliminated pork from her diet and primarily consumes chicken, fish, salmon, shrimp, and halibut.  She exercises three days a week for about 45 to 60 minutes, incorporating strength training, cardio, and yoga. She has started running using an app that alternates between running and walking intervals and has taken up playing tennis. She frequents [employer] for her workouts and notes that her clothes fit differently, with more room around her  waist.  She is currently taking metformin  500 mg twice daily for weight management and diabetes prevention without any adverse effects      Challenges affecting patient progress: exposure to enticing environments and/or relationships.    Pharmacotherapy for weight management: She is currently taking Metformin  (off label use for incretin effect and / or insulin  resistance and / or diabetes prevention) with adequate clinical response  and without side effects..   Assessment and Plan   Treatment Plan For Obesity:  Recommended Dietary Goals  Christine Fields is currently in the action stage of change. As such, her goal is to continue weight management plan. She has agreed to: follow a tailored, multi-day, low carbohydrate, high protein plan targeting 1500 calories and 120 grams of protein per day  Behavioral Health and Counseling  We discussed the following behavioral modification strategies today: continue to work on maintaining a reduced calorie state, getting the recommended amount of protein, incorporating whole foods, making healthy choices, staying well hydrated and practicing mindfulness when eating..  Additional education and resources provided today: None  Recommended Physical Activity Goals  Christine Fields has been advised to work up to 150 minutes of moderate intensity aerobic activity a week and strengthening exercises 2-3 times per week for cardiovascular health, weight loss maintenance and preservation of muscle mass.   She has agreed to :  Think about enjoyable ways to increase daily physical activity and overcoming barriers to exercise and Increase physical activity in their day and reduce sedentary time (increase NEAT).  Pharmacotherapy  We discussed various medication options to help  Christine Fields with her weight loss efforts and we both agreed to : adequate clinical response to anti-obesity medication, continue current regimen  Associated Conditions Impacted by Obesity Treatment  Class 3  severe obesity with serious comorbidity and body mass index (BMI) of 40.0 to 44.9 in adult  Prediabetes -     metFORMIN  HCl; Take 1 tablet (500 mg total) by mouth 2 (two) times daily with a meal.  Dispense: 120 tablet; Refill: 0     Assessment and Plan    Obesity Obesity management focuses on weight reduction through lifestyle modifications. She has lost four pounds since the last visit and reduced body fat percentage, indicating muscle gain. She adheres to a low-carb diet approximately 90% of the time and engages in regular physical activity, including strength training, cardio, and yoga. She tracks her calories, maintains adequate hydration, and does not skip meals. Challenges with dietary adherence occur during family gatherings, but she is making efforts to manage these situations. - Continue low-carb diet with a target of 1500 calories per day to maintain a 500 calorie deficit for weight loss. - Utilize AI-generated meal plan for variety and adherence to dietary goals. - Increase physical activity volume, including strength training, cardio, and yoga. - Encourage consistency in dietary and exercise habits, especially during social events. - Plan follow-up in four weeks to monitor progress.  Prediabetes Prediabetes is managed with metformin . She takes 500 mg twice daily without adverse effects such as diarrhea. The medication is well-tolerated and taken with food. Her dietary and exercise regimen also contributes to glycemic control. - Prescribe metformin  500 mg twice daily with a 60-day supply. - Send prescription to [facility] pharmacy. - Continue current dietary and exercise regimen to support glycemic control.        Objective   Physical Exam:  Blood pressure 136/89, pulse (!) 59, temperature 98.2 F (36.8 C), height 5' 6.5" (1.689 m), weight 261 lb (118.4 kg), SpO2 96%. Body mass index is 41.5 kg/m.  General: She is overweight, cooperative, alert, well developed, and in  no acute distress. PSYCH: Has normal mood, affect and thought process.   HEENT: EOMI, sclerae are anicteric. Lungs: Normal breathing effort, no conversational dyspnea. Extremities: No edema.  Neurologic: No gross sensory or motor deficits. No tremors or fasciculations noted.    Diagnostic Data Reviewed:  BMET    Component Value Date/Time   NA 139 12/08/2022 0855   K 4.2 12/08/2022 0855   CL 103 12/08/2022 0855   CO2 22 12/08/2022 0855   GLUCOSE 110 (H) 12/08/2022 0855   GLUCOSE 108 (H) 07/11/2019 1124   BUN 10 12/08/2022 0855   CREATININE 0.77 12/08/2022 0855   CALCIUM  9.3 12/08/2022 0855   GFRNONAA 57 (L) 11/10/2019 1519   GFRAA 66 11/10/2019 1519   Lab Results  Component Value Date   HGBA1C 6.3 (H) 12/08/2022   HGBA1C 5.7 (H) 05/24/2017   Lab Results  Component Value Date   INSULIN  13.7 12/08/2022   INSULIN  18.4 05/03/2019   Lab Results  Component Value Date   TSH 3.860 09/07/2022   CBC    Component Value Date/Time   WBC 7.7 01/28/2022 1043   WBC 13.6 (H) 05/16/2011 2140   RBC 4.98 01/28/2022 1043   RBC 4.75 05/16/2011 2140   HGB 14.1 01/28/2022 1043   HCT 42.7 01/28/2022 1043   PLT 219 01/28/2022 1043   MCV 86 01/28/2022 1043   MCH 28.3 01/28/2022 1043   MCH 29.1 05/16/2011 2140   MCHC  33.0 01/28/2022 1043   MCHC 34.2 05/16/2011 2140   RDW 12.8 01/28/2022 1043   Iron Studies No results found for: "IRON", "TIBC", "FERRITIN", "IRONPCTSAT" Lipid Panel     Component Value Date/Time   CHOL 169 08/24/2022 0847   TRIG 81 08/24/2022 0847   HDL 37 (L) 08/24/2022 0847   CHOLHDL 5.4 (H) 01/28/2022 1043   LDLCALC 117 (H) 08/24/2022 0847   LDLDIRECT 103 (H) 11/10/2019 1519   Hepatic Function Panel     Component Value Date/Time   PROT 6.6 12/08/2022 0855   ALBUMIN 4.4 12/08/2022 0855   AST 26 12/08/2022 0855   ALT 23 12/08/2022 0855   ALKPHOS 67 12/08/2022 0855   BILITOT 0.8 12/08/2022 0855      Component Value Date/Time   TSH 3.860 09/07/2022 1552    Nutritional Lab Results  Component Value Date   VD25OH 55.0 12/08/2022   VD25OH 36.3 08/24/2022   VD25OH 30.1 11/12/2020    Medications: Outpatient Encounter Medications as of 06/14/2023  Medication Sig   bisoprolol -hydrochlorothiazide  (ZIAC ) 10-6.25 MG tablet Take 2 tablets by mouth daily.   desloratadine  (CLARINEX ) 5 MG tablet Take 1 tablet (5 mg total) by mouth daily.   ipratropium (ATROVENT ) 0.03 % nasal spray Place 2 sprays into both nostrils every 12 (twelve) hours.   levonorgestrel (MIRENA, 52 MG,) 20 MCG/24HR IUD    rizatriptan  (MAXALT ) 10 MG tablet Take 1 tablet (10 mg total) by mouth as needed for migraine, may repeat in 2 hours if needed   rosuvastatin  (CRESTOR ) 5 MG tablet Take 1 tablet (5 mg total) by mouth every other day.   valsartan  (DIOVAN ) 320 MG tablet Take 1 tablet (320 mg total) by mouth daily.   metFORMIN  (GLUCOPHAGE ) 500 MG tablet Take 1 tablet (500 mg total) by mouth 2 (two) times daily with a meal.   [DISCONTINUED] metFORMIN  (GLUCOPHAGE ) 500 MG tablet Take 1 tablet (500 mg total) by mouth 2 (two) times daily with a meal.   No facility-administered encounter medications on file as of 06/14/2023.     Follow-Up   Return in about 4 weeks (around 07/12/2023) for For Weight Mangement with Dr. Allie Area.Aaron Aas She was informed of the importance of frequent follow up visits to maximize her success with intensive lifestyle modifications for her multiple health conditions.  Attestation Statement   Reviewed by clinician on day of visit: allergies, medications, problem list, medical history, surgical history, family history, social history, and previous encounter notes.     Ladd Picker, MD

## 2023-07-20 ENCOUNTER — Ambulatory Visit (INDEPENDENT_AMBULATORY_CARE_PROVIDER_SITE_OTHER): Admitting: Internal Medicine

## 2023-07-20 ENCOUNTER — Encounter (INDEPENDENT_AMBULATORY_CARE_PROVIDER_SITE_OTHER): Payer: Self-pay | Admitting: Internal Medicine

## 2023-07-20 VITALS — BP 138/86 | HR 76 | Temp 97.8°F | Ht 66.5 in | Wt 260.0 lb

## 2023-07-20 DIAGNOSIS — E66813 Obesity, class 3: Secondary | ICD-10-CM | POA: Diagnosis not present

## 2023-07-20 DIAGNOSIS — Z6841 Body Mass Index (BMI) 40.0 and over, adult: Secondary | ICD-10-CM

## 2023-07-20 DIAGNOSIS — R7303 Prediabetes: Secondary | ICD-10-CM

## 2023-07-20 NOTE — Progress Notes (Signed)
 Office: [phone]  /  Fax: [phone]  Weight Summary and Body Composition Analysis (BIA)  Vitals Temp: 97.8 F (36.6 C) BP: 138/86 Pulse Rate: 76 SpO2: 97 %   Anthropometric Measurements Height: 5' 6.5 (1.689 m) Weight: 260 lb (117.9 kg) BMI (Calculated): 41.34 Weight at Last Visit: 261 lb Weight Lost Since Last Visit: 1 lb Weight Gained Since Last Visit: 0 lb Starting Weight: 258 lb Total Weight Loss (lbs): 0 lb (0 kg) Peak Weight: 272 lb   Body Composition  Body Fat %: 48.4 % Fat Mass (lbs): 126.2 lbs Muscle Mass (lbs): 127.8 lbs Total Body Water (lbs): 98.6 lbs Visceral Fat Rating : 14    RMR: 1757  Today's Visit #: 11  Starting Date: 04/12/23   Subjective   Chief Complaint: Obesity  Interval History Discussed the use of AI scribe software for clinical note transcription with the patient, who gave verbal consent to proceed.  History of Present Illness   Christine Fields is a 43 year old female who presents for weight management.  She has lost one pound since her last visit and adheres to a low-carb diet plan approximately ninety percent of the time. She takes metformin  without experiencing adverse effects such as diarrhea or abdominal pain. She experiences a sense of fullness and does not have cravings for sweets.  Her daily diet typically includes two strips of bacon and one boiled egg for breakfast, a [place] salad without feta cheese and granola for lunch, and a bowl of old-fashioned oatmeal for dinner. Despite disliking boiled eggs, she consumes them regularly. She prefers fish and grilled foods, such as salmon and grilled steak, while avoiding fried chicken.  She engages in regular physical activity, playing tennis every Thursday and attending boot camp on Mondays and Wednesdays, totaling approximately 240 minutes of exercise per week. She notes that her clothes fit differently, with her jeans becoming loose, and she feels satisfied with her  meals without experiencing hunger.  She is concerned about not consuming enough protein, although she believes she eats a lot of fish and grilled meats. She uses a meal prep plan to manage her meals and is mindful of her sugar intake, balancing her diet accordingly.  She inquires about the impact of being perimenopausal on weight loss and discusses genetic factors affecting weight loss, particularly in African American and Hispanic women. She acknowledges that her weight loss is gradual and that she has a history of skipping meals, which she is trying to address.       Challenges affecting patient progress: none.    Pharmacotherapy for weight management: She is currently taking Metformin  with diabetes as primary indication with adequate clinical response  and without side effects. and does not have coverage for GLP-1.   Assessment and Plan   Treatment Plan For Obesity:  Recommended Dietary Goals  Lavora is currently in the action stage of change. As such, her goal is to continue weight management plan. She has agreed to: continue current plan  Behavioral Health and Counseling  We discussed the following behavioral modification strategies today: continue to work on maintaining a reduced calorie state, getting the recommended amount of protein, incorporating whole foods, making healthy choices, staying well hydrated and practicing mindfulness when eating..  Additional education and resources provided today: None  Recommended Physical Activity Goals  Ebone has been advised to work up to 150 minutes of moderate intensity aerobic activity a week and strengthening exercises 2-3 times per week for cardiovascular health, weight  loss maintenance and preservation of muscle mass.   She has agreed to :  Think about enjoyable ways to increase daily physical activity and overcoming barriers to exercise and Increase physical activity in their day and reduce sedentary time (increase  NEAT).  Medical Interventions and Pharmacotherapy  We discussed various medication options to help Arrin with her weight loss efforts and we both agreed to : Adequate clinical response to anti-obesity medication, continue current regimen  Associated Conditions Impacted by Obesity Treatment  Assessment & Plan Class 3 severe obesity with serious comorbidity and body mass index (BMI) of 40.0 to 44.9 in adult  Prediabetes     Assessment and Plan    Weight Management She is experiencing difficulty losing weight despite adherence to a low-carb diet and regular physical activity, including tennis and boot camp. Reports a sense of fullness without cravings for sweets. Clothes fitting looser suggest a change in body composition, likely due to muscle gain. Suspected insufficient protein intake may contribute to weight management challenges. Genetic factors affecting weight loss in minority women were discussed, emphasizing the need for higher protein intake to overcome metabolic resistance. - Increase protein intake to 30 grams per meal, three times a day. - Consider protein shakes and bars as meal replacements or supplements. - Focus on high-protein meals such as grilled chicken salads, protein bowls, and incorporating protein-rich foods like salmon. - Utilize meal prep strategies and online resources for high-protein, low-carb meals. - Schedule follow-up in one month to reassess progress.  Prediabetes She is on metformin  for pharmacoprophylaxis  with no adverse effects such as loose stools or stomach pains. Emphasis on balancing protein intake to manage blood glucose levels effectively. Importance of low-sugar, low-carb protein options was discussed to support diabetes management. - Continue metformin  as prescribed. - Incorporate low-sugar, low-carb protein shakes and bars into the diet.  General Health Maintenance Guidance provided on dietary modifications to support overall health and  weight management, including meal planning and protein intake for metabolic health. Discussed inclusion of healthy fats like avocados and use of light mayonnaise to limit high-calorie condiments. - Utilize online resources and meal prep strategies to plan balanced meals. - Incorporate healthy fats like avocados in moderation. - Use light mayonnaise and limit high-calorie condiments.        Objective   Physical Exam:  Blood pressure 138/86, pulse 76, temperature 97.8 F (36.6 C), height 5' 6.5 (1.689 m), weight 260 lb (117.9 kg), SpO2 97%. Body mass index is 41.34 kg/m.  General: She is overweight, cooperative, alert, well developed, and in no acute distress. PSYCH: Has normal mood, affect and thought process.   HEENT: EOMI, sclerae are anicteric. Lungs: Normal breathing effort, no conversational dyspnea. Extremities: No edema.  Neurologic: No gross sensory or motor deficits. No tremors or fasciculations noted.    Diagnostic Data Reviewed:  BMET    Component Value Date/Time   NA 139 12/08/2022 0855   K 4.2 12/08/2022 0855   CL 103 12/08/2022 0855   CO2 22 12/08/2022 0855   GLUCOSE 110 (H) 12/08/2022 0855   GLUCOSE 108 (H) 07/11/2019 1124   BUN 10 12/08/2022 0855   CREATININE 0.77 12/08/2022 0855   CALCIUM  9.3 12/08/2022 0855   GFRNONAA 57 (L) 11/10/2019 1519   GFRAA 66 11/10/2019 1519   Lab Results  Component Value Date   HGBA1C 6.3 (H) 12/08/2022   HGBA1C 5.7 (H) 05/24/2017   Lab Results  Component Value Date   INSULIN  13.7 12/08/2022   INSULIN  18.4  05/03/2019   Lab Results  Component Value Date   TSH 3.860 09/07/2022   CBC    Component Value Date/Time   WBC 7.7 01/28/2022 1043   WBC 13.6 (H) 05/16/2011 2140   RBC 4.98 01/28/2022 1043   RBC 4.75 05/16/2011 2140   HGB 14.1 01/28/2022 1043   HCT 42.7 01/28/2022 1043   PLT 219 01/28/2022 1043   MCV 86 01/28/2022 1043   MCH 28.3 01/28/2022 1043   MCH 29.1 05/16/2011 2140   MCHC 33.0 01/28/2022 1043    MCHC 34.2 05/16/2011 2140   RDW 12.8 01/28/2022 1043   Iron Studies No results found for: IRON, TIBC, FERRITIN, IRONPCTSAT Lipid Panel     Component Value Date/Time   CHOL 169 08/24/2022 0847   TRIG 81 08/24/2022 0847   HDL 37 (L) 08/24/2022 0847   CHOLHDL 5.4 (H) 01/28/2022 1043   LDLCALC 117 (H) 08/24/2022 0847   LDLDIRECT 103 (H) 11/10/2019 1519   Hepatic Function Panel     Component Value Date/Time   PROT 6.6 12/08/2022 0855   ALBUMIN 4.4 12/08/2022 0855   AST 26 12/08/2022 0855   ALT 23 12/08/2022 0855   ALKPHOS 67 12/08/2022 0855   BILITOT 0.8 12/08/2022 0855      Component Value Date/Time   TSH 3.860 09/07/2022 1552   Nutritional Lab Results  Component Value Date   VD25OH 55.0 12/08/2022   VD25OH 36.3 08/24/2022   VD25OH 30.1 11/12/2020    Medications: Outpatient Encounter Medications as of 07/20/2023  Medication Sig   bisoprolol -hydrochlorothiazide  (ZIAC ) 10-6.25 MG tablet Take 2 tablets by mouth daily.   desloratadine  (CLARINEX ) 5 MG tablet Take 1 tablet (5 mg total) by mouth daily.   ipratropium (ATROVENT ) 0.03 % nasal spray Place 2 sprays into both nostrils every 12 (twelve) hours.   levonorgestrel (MIRENA, 52 MG,) 20 MCG/24HR IUD    metFORMIN  (GLUCOPHAGE ) 500 MG tablet Take 1 tablet (500 mg total) by mouth 2 (two) times daily with a meal.   rizatriptan  (MAXALT ) 10 MG tablet Take 1 tablet (10 mg total) by mouth as needed for migraine, may repeat in 2 hours if needed   rosuvastatin  (CRESTOR ) 5 MG tablet Take 1 tablet (5 mg total) by mouth every other day.   valsartan  (DIOVAN ) 320 MG tablet Take 1 tablet (320 mg total) by mouth daily.   No facility-administered encounter medications on file as of 07/20/2023.     Follow-Up   Return in about 4 weeks (around 08/17/2023) for For Weight Mangement with Dr. Francyne.SABRA She was informed of the importance of frequent follow up visits to maximize her success with intensive lifestyle modifications for her  multiple health conditions.  Attestation Statement   Reviewed by clinician on day of visit: allergies, medications, problem list, medical history, surgical history, family history, social history, and previous encounter notes.     Lucas Francyne, MD

## 2023-08-18 ENCOUNTER — Other Ambulatory Visit: Payer: Self-pay | Admitting: Obstetrics

## 2023-08-18 ENCOUNTER — Other Ambulatory Visit: Payer: Self-pay

## 2023-08-18 ENCOUNTER — Other Ambulatory Visit (HOSPITAL_COMMUNITY): Payer: Self-pay

## 2023-08-18 DIAGNOSIS — J321 Chronic frontal sinusitis: Secondary | ICD-10-CM

## 2023-08-23 ENCOUNTER — Ambulatory Visit (INDEPENDENT_AMBULATORY_CARE_PROVIDER_SITE_OTHER): Admitting: Internal Medicine

## 2023-09-07 ENCOUNTER — Ambulatory Visit: Payer: Self-pay

## 2023-09-07 ENCOUNTER — Other Ambulatory Visit: Payer: Self-pay | Admitting: Medical

## 2023-09-07 NOTE — Telephone Encounter (Signed)
 Appointment made for 09/09/2023 at 3:45 PM at patient's PCP office with PCP Alm Gent, PA-C  FYI Only or Action Required?: FYI only for provider.  Patient was last seen in primary care on 07/20/2023 by Francyne Romano, MD.  Called Nurse Triage reporting Bilateral Ankle Swelling.  Symptoms began several months ago.  Interventions attempted: Rest, hydration, or home remedies.  Symptoms are: gradually worsening.  Triage Disposition: See PCP When Office is Open (Within 3 Days)  Patient/caregiver understands and will follow disposition?: Yes                    Copied from CRM #[id number]. Topic: Clinical - Red Word Triage >> Sep 07, 2023  3:36 PM Antwanette L wrote: Red Word that prompted transfer to Nurse Triage: Both of the pt ankles are swollen. The pt is currently taking valsartan  (DIOVAN ) 320 MG tablet and she believes its causing the swelling Reason for Disposition  [1] MILD swelling of both ankles (e.g., ankle joints look swollen; or bilateral mild pedal edema) AND [2] new-onset or getting worse  (Exceptions: Caused by hot weather, already seen by doctor or NP/PA for this.)  Answer Assessment - Initial Assessment Questions Patient states that she started noticing bilateral ankle swelling about a month after starting Valsartan  (started Valsartan  03/10/2023) and the swelling has gotten worse in the past two months. Patient denies any other symptoms States the swelling isnt noticeable in the mornings but will be noticeable throughout the day Patient is advised that if anything worsens to go to the Emergency Room. Patient verbalized understanding.      1. LOCATION: Which ankle is swollen? Where is the swelling?     Both ankles 2. ONSET: When did the swelling start?     About a month after starting valsartan  and noticeable the past two months 3. SWELLING: How bad is the swelling? Or, How large is it? (e.g., mild, moderate, severe; size of localized  swelling)      Not first thing in the morning--will notice it swelling throughout the day 4. PAIN: Is there any pain? If Yes, ask: How bad is it? (Scale 0-10; or none, mild, moderate, severe)     0 5. CAUSE: What do you think caused the ankle swelling?     unsure 6. OTHER SYMPTOMS: Do you have any other symptoms? (e.g., fever, chest pain, difficulty breathing, calf pain)     No 7. PREGNANCY: Is there any chance you are pregnant? When was your last menstrual period?     No  Protocols used: Ankle Swelling-A-AH

## 2023-09-09 ENCOUNTER — Ambulatory Visit: Admitting: Medical

## 2023-09-09 VITALS — BP 122/84 | HR 52 | Temp 98.5°F | Wt 267.8 lb

## 2023-09-09 DIAGNOSIS — R609 Edema, unspecified: Secondary | ICD-10-CM

## 2023-09-09 DIAGNOSIS — M25473 Effusion, unspecified ankle: Secondary | ICD-10-CM | POA: Diagnosis not present

## 2023-09-09 DIAGNOSIS — I872 Venous insufficiency (chronic) (peripheral): Secondary | ICD-10-CM | POA: Diagnosis not present

## 2023-09-09 DIAGNOSIS — I10 Essential (primary) hypertension: Secondary | ICD-10-CM

## 2023-09-09 NOTE — Progress Notes (Signed)
 Subjective:  Christine Fields is a 43 y.o. female who presents for Chief Complaint  Patient presents with   Acute Visit    Bilateral ankle swelling     Here for bilateral ankle swelling.  This has been going on for several months.  It is just in the ankles.  It is better when she gets up in the morning.  It is worse if sitting for long periods of time.  Using compression hose over-the-counter 15 to 20 mm.  No excess salt intake.  She has good about water intake.  She is compliant with her blood pressure medications.  No shortness of breath.  No urinary changes.  No other aggravating or relieving factors.    No other c/o.  Past Medical History:  Diagnosis Date   Alkaline phosphatase elevation 2021   Hypertension    Migraine    Pre-diabetes    Vitamin D  deficiency     Current Outpatient Medications on File Prior to Visit  Medication Sig Dispense Refill   bisoprolol -hydrochlorothiazide  (ZIAC ) 10-6.25 MG tablet Take 2 tablets by mouth daily. 180 tablet 2   rizatriptan  (MAXALT ) 10 MG tablet Take 1 tablet (10 mg total) by mouth as needed for migraine, may repeat in 2 hours if needed 10 tablet 5   rosuvastatin  (CRESTOR ) 5 MG tablet Take 1 tablet (5 mg total) by mouth every other day. 90 tablet 2   valsartan  (DIOVAN ) 320 MG tablet Take 1 tablet (320 mg total) by mouth daily. 90 tablet 2   desloratadine  (CLARINEX ) 5 MG tablet Take 1 tablet (5 mg total) by mouth daily. (Patient not taking: Reported on 09/09/2023) 30 tablet 11   levonorgestrel (MIRENA, 52 MG,) 20 MCG/24HR IUD      metFORMIN  (GLUCOPHAGE ) 500 MG tablet Take 1 tablet (500 mg total) by mouth 2 (two) times daily with a meal. (Patient not taking: Reported on 09/09/2023) 120 tablet 0   No current facility-administered medications on file prior to visit.     The following portions of the patient's history were reviewed and updated as appropriate: allergies, current medications, past family history, past medical history, past social  history, past surgical history and problem list.  ROS Otherwise as in subjective above  Objective: BP 122/84   Pulse (!) 52   Temp 98.5 F (36.9 C)   Wt 267 lb 12.8 oz (121.5 kg)   SpO2 99%   BMI 42.58 kg/m   Wt Readings from Last 3 Encounters:  09/09/23 267 lb 12.8 oz (121.5 kg)  07/20/23 260 lb (117.9 kg)  06/14/23 261 lb (118.4 kg)   BP Readings from Last 3 Encounters:  09/09/23 122/84  07/20/23 138/86  06/14/23 136/89    General appearance: alert, no distress, well developed, well nourished Neck: supple, no lymphadenopathy, no thyromegaly, no masses, no JVD Heart: RRR, normal S1, S2, no murmurs Lungs: CTA bilaterally, no wheezes, rhonchi, or rales Pulses: 2+ radial pulses, 2+ pedal pulses, normal cap refill Ext: Mild bilateral ankle puffiness but no other lower extremity significant edema, no calf tenderness, no Homans' sign, no calf asymmetry    Assessment: Encounter Diagnoses  Name Primary?   Ankle swelling, unspecified laterality Yes   Venous insufficiency    Dependent edema    Essential hypertension, benign      Plan: We discussed symptoms and concerns.  Symptoms most likely related to dependent edema from sitting for long periods during the day.  She is active and exercising regularly.  Continue efforts to lose weight  through healthy diet and exercise.  Limit salt.  Continue bisoprolol  HCT 10/6.25 mg daily.  Temporarily cut down valsartan  320 mg daily to 1/2 tablet daily or 160 mg daily.  Monitor blood pressures.  Consider stronger strength compression hose such as 20 to 30 mmHg.  Call report in 2 weeks to let me know how things are going.  Harveen was seen today for acute visit.  Diagnoses and all orders for this visit:  Ankle swelling, unspecified laterality  Venous insufficiency  Dependent edema  Essential hypertension, benign    Follow up: If not improving over the next 2 weeks

## 2023-09-17 NOTE — Progress Notes (Signed)
 [facility] Initial Assessment:    Date:  09/23/2023   ID:  Christine Fields, DOB 1980-11-15, MRN [id number]  PCP:  Bulah Alm RAMAN, PA-C  Cardiologist:  Shelda Bruckner, MD  Nephrologist:  Referring MD: Bulah Alm RAMAN, PA-C   CC: Hypertension  History of Present Illness:    Christine Fields is a 43 y.o. female with a hx of hypertension, hyperlipidemia, and obesity here to establish care in the [facility]. She has been on bispoprolol-hydrochlorothiazide  and valsartan  and saw Dr. Bruckner 05/2022.  At that time her blood pressure was 130/92.  It was felt that her blood pressures were improving and she was working on lifestyle so no changes were made at that time.  She last saw her PCP 08/2023 and blood pressure was 122/84.  She has been working with healthy weight and wellness  Discussed the use of AI scribe software for clinical note transcription with the patient, who gave verbal consent to proceed.  History of Present Illness Christine Fields has a history of hypertension for at least five years, with home blood pressure readings often around 134/89 to 134/92, and occasionally as low as 122/84. She is currently taking valsartan  in the afternoon and Ziac  (a combination of hydrochlorothiazide  and bisoprolol ) in the morning. She has previously tried amlodipine , which caused headaches, and hydrochlorothiazide  alone, which led to frequent urination. She occasionally forgets to take her afternoon dose of valsartan , especially when traveling.  Her family history is significant for hypertension; her father has high blood pressure and suffered a stroke, and her paternal grandmother died of a heart attack at 60. Her mother does not have high blood pressure, but her maternal grandmother reportedly had it, though she never saw a doctor for it.  In terms of lifestyle, she does not smoke and consumes alcohol very sparingly, with only about two drinks per  year. She exercises about three times a week, engaging in activities like walking, aerobics, and tennis. She is mindful of her diet, avoiding excessive salt and red meat, and prefers chicken and fish. She drinks coffee in the morning and enjoys lemonade and sweet tea, but does not consume much soda.  She experiences occasional swelling in her ankles, which improves with leg elevation. She sometimes feels tired upon waking and can fall asleep easily during the day, such as while watching a movie. She reports no chest pain and her breathing is okay during exercise.  Previous antihypertensives:  Amlodipine -HA   Past Medical History:  Diagnosis Date   Alkaline phosphatase elevation 2021   Hypertension    Migraine    Pre-diabetes    Resistant hypertension 03/02/2017   Vitamin D  deficiency     Past Surgical History:  Procedure Laterality Date   CESAREAN SECTION     KNEE ARTHROSCOPY Right 07/13/2019   Procedure: RIGHT KNEE ARTHROSCOPY PARTIAL MEDIAL MENISCECTOMY MEDIAL CHONDROPLASTY;  Surgeon: Sheril Coy, MD;  [facility]  [facility];  Service: Orthopedics;  Laterality: Right;    Current Medications: Current Meds  Medication Sig   bisoprolol -hydrochlorothiazide  (ZIAC ) 10-6.25 MG tablet Take 2 tablets by mouth daily.   levonorgestrel (MIRENA, 52 MG,) 20 MCG/24HR IUD    rizatriptan  (MAXALT ) 10 MG tablet Take 1 tablet (10 mg total) by mouth as needed for migraine, may repeat in 2 hours if needed   rosuvastatin  (CRESTOR ) 5 MG tablet Take 1 tablet (5 mg total) by mouth every other day.   valsartan  (DIOVAN ) 320 MG tablet Take 1 tablet (320  mg total) by mouth daily.   [DISCONTINUED] spironolactone  (ALDACTONE ) 25 MG tablet Take 1 tablet (25 mg total) by mouth daily.     Allergies:   Amlodipine  and Kiwi extract   Social History   Socioeconomic History   Marital status: Divorced    Spouse name: Not on file   Number of children: Not on file   Years of education: Not on file    Highest education level: Doctorate  Occupational History   Occupation: Administrator, Civil Service  Tobacco Use   Smoking status: Never   Smokeless tobacco: Never  Vaping Use   Vaping status: Never Used  Substance and Sexual Activity   Alcohol use: Not Currently    Alcohol/week: 1.0 standard drink of alcohol    Types: 1 Glasses of wine per week    Comment: occasionally   Drug use: No   Sexual activity: Not Currently  Other Topics Concern   Not on file  Social History Narrative   Lives with daughter, 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  02/2023   Social Drivers of Health   Financial Resource Strain: Low Risk  (09/23/2023)   Overall Financial Resource Strain (CARDIA)    Difficulty of Paying Living Expenses: Not hard at all  Food Insecurity: No Food Insecurity (03/09/2023)   Hunger Vital Sign    Worried About Running Out of Food in the Last Year: Never true    Ran Out of Food in the Last Year: Never true  Transportation Needs: No Transportation Needs (03/09/2023)   PRAPARE - Administrator, Civil Service (Medical): No    Lack of Transportation (Non-Medical): No  Physical Activity: Sufficiently Active (03/09/2023)   Exercise Vital Sign    Days of Exercise per Week: 4 days    Minutes of Exercise per Session: 40 min  Stress: No Stress Concern Present (09/23/2023)   [employer] of Occupational Health - Occupational Stress Questionnaire    Feeling of Stress: Only a little  Social Connections: Moderately Integrated (03/09/2023)   Social Connection and Isolation Panel    Frequency of Communication with Friends and Family: More than three times a week    Frequency of Social Gatherings with Friends and Family: Patient declined    Attends Religious Services: More than 4 times per year    Active Member of [employer] or Organizations: Yes    Attends Engineer, structural: 1 to 4 times per year    Marital Status: Divorced     Family History: The patient's  family history includes Cancer in her maternal grandfather; Diabetes in her father and maternal grandfather; Heart attack (age of onset: 59) in her paternal grandmother; Hypertension in her father, maternal grandmother, and paternal grandmother; Stroke in her father.  ROS:   Please see the history of present illness.     All other systems reviewed and are negative.  EKGs/Labs/Other Studies Reviewed:    EKG:  EKG is  ordered today.   EKG Interpretation Date/Time:  Thursday September 23 2023 08:40:39 EDT Ventricular Rate:  52 PR Interval:  184 QRS Duration:  86 QT Interval:  444 QTC Calculation: 412 R Axis:   90  Text Interpretation: Sinus bradycardia Rightward axis Nonspecific T wave abnormality Since last tracing Left bundle branch block  nlp Confirmed by Raford Riggs ([id number]) on 09/23/2023 8:54:32 AM         Recent Labs: 12/08/2022: ALT 23; BUN 10; Creatinine, Ser 0.77; Potassium 4.2; Sodium 139   Recent Lipid  Panel    Component Value Date/Time   CHOL 169 08/24/2022 0847   TRIG 81 08/24/2022 0847   HDL 37 (L) 08/24/2022 0847   CHOLHDL 5.4 (H) 01/28/2022 1043   LDLCALC 117 (H) 08/24/2022 0847   LDLDIRECT 103 (H) 11/10/2019 1519    Physical Exam:   VS:  BP (!) 166/112 (BP Location: Left Arm)   Pulse (!) 56   Ht 5' 6.5 (1.689 m)   Wt 263 lb 12.8 oz (119.7 kg)   BMI 41.94 kg/m  , BMI Body mass index is 41.94 kg/m. GENERAL:  Well appearing HEENT: Pupils equal round and reactive, fundi not visualized, oral mucosa unremarkable NECK:  No jugular venous distention, waveform within normal limits, carotid upstroke brisk and symmetric, no bruits, no thyromegaly LUNGS:  Clear to auscultation bilaterally HEART:  RRR.  PMI not displaced or sustained,S1 and S2 within normal limits, no S3, no S4, no clicks, no rubs, no murmurs ABD:  Flat, positive bowel sounds normal in frequency in pitch, no bruits, no rebound, no guarding, no midline pulsatile mass, no hepatomegaly, no  splenomegaly EXT:  2 plus pulses throughout, no edema, no cyanosis no clubbing SKIN:  No rashes no nodules NEURO:  Cranial nerves II through XII grossly intact, motor grossly intact throughout PSYCH:  Cognitively intact, oriented to person place and time   ASSESSMENT/PLAN:    Assessment & Plan Resistant essential hypertension Long-standing hypertension, poorly controlled despite valsartan  and Ziac . Home readings often in the 130s/80s-90s. Concerns about resistant hypertension due to potential secondary causes such as sleep apnea, hyperaldosteronism, and renovascular disease. These conditions are rare but important to rule out before escalating medication. - Order home sleep study to evaluate for obstructive sleep apnea. - Order labs to check renin and aldosterone levels for hyperaldosteronism. - Order renal artery ultrasound to assess for renovascular disease. - Add spironolactone  25mg  daily to current regimen, to be taken in the morning. - Instruct to take valsartan  at night to improve adherence. - Schedule follow-up labs in one week to monitor potassium levels. - Educate on reducing carbohydrate intake, particularly from sweetened beverages, to aid in weight loss and blood pressure control.  # Suspected obstructive sleep apnea Suspected obstructive sleep apnea due to occasional daytime fatigue and potential impact on blood pressure control. - Order home sleep study to evaluate for obstructive sleep apnea.  # Morbid obesity: Obesity with BMI in the range of 40-45 contributing to hypertension and potential sleep apnea. Discussed the impact of weight loss on blood pressure, noting that for every 20 pounds lost, blood pressure can decrease by approximately 10 points.   - Encourage continuation and increase of physical activity to four days a week. - Advise reduction of sweetened beverages to no more than one per day to aid in weight loss.  # Chronic lower extremity edema due to venous  insufficiency Chronic lower extremity edema, worsened by prolonged sitting, relieved by leg elevation. Likely due to venous insufficiency. - Recommend use of compression stockings to manage edema. - Advise regular movement and leg elevation to reduce swelling.  # Prediabetes A1c in the prediabetes range.  Discussed limiting carbohydrate intake.  Increasing exercise as above.  She would benefit from GLP-1 if insurance allows.    # FH Premature CAD:  Discuss coronary calcium  score at follow up.  She seemed overwhelmed with testing at this appointment, so this discussion was deferred.   # Hyperlipidemia; Continue rosuvastatin .     Screening for Secondary Hypertension:  09/23/2023    9:00 AM  Causes  Drugs/Herbals Screened     - Comments limits caffeine, watches salt. rare NSAIDS. No tobacco.  Rare EtOH  Renovascular HTN Screened     - Comments check renal Dopplers.  Sleep Apnea Screened     - Comments ?snoring.  +daytime somnolence.  Check Itamar  Thyroid  Disease Screened  Hyperaldosteronism Screened     - Comments check renin/aldo  Pheochromocytoma N/A  Cushing's Syndrome N/A  Hyperparathyroidism Screened  Coarctation of the Aorta Screened     - Comments BP symmetric  Compliance Screened    Relevant Labs/Studies:    Latest Ref Rng & Units 12/08/2022    8:55 AM 08/24/2022    8:47 AM 01/28/2022   10:43 AM  Basic Labs  Sodium 134 - 144 mmol/L 139  137  139   Potassium 3.5 - 5.2 mmol/L 4.2  3.9  4.1   Creatinine 0.57 - 1.00 mg/dL 9.22  9.25  9.17        Latest Ref Rng & Units 09/07/2022    3:52 PM 08/24/2022    8:47 AM  Thyroid    TSH 0.450 - 4.500 uIU/mL 3.860  4.660                 09/23/2023    9:32 AM  Renovascular   Renal Artery US  Completed Yes        Disposition:    FU with MD/PharmD in 2 months    Medication Adjustments/Labs and Tests Ordered: Current medicines are reviewed at length with the patient today.  Concerns regarding medicines are  outlined above.  Orders Placed This Encounter  Procedures   Aldosterone + renin activity w/ ratio   Basic metabolic panel with GFR   EKG 87-Ozji   Itamar Sleep Study   VAS US  RENAL ARTERY DUPLEX   Meds ordered this encounter  Medications   DISCONTD: spironolactone  (ALDACTONE ) 25 MG tablet    Sig: Take 1 tablet (25 mg total) by mouth daily.    Dispense:  90 tablet    Refill:  3   spironolactone  (ALDACTONE ) 25 MG tablet    Sig: Take 1 tablet (25 mg total) by mouth daily.    Dispense:  90 tablet    Refill:  3     Signed, Annabella Scarce, MD  09/23/2023 10:10 AM     [facility]

## 2023-09-22 ENCOUNTER — Encounter: Payer: Self-pay | Admitting: *Deleted

## 2023-09-23 ENCOUNTER — Ambulatory Visit (HOSPITAL_BASED_OUTPATIENT_CLINIC_OR_DEPARTMENT_OTHER): Admitting: Cardiovascular Disease

## 2023-09-23 ENCOUNTER — Other Ambulatory Visit (HOSPITAL_BASED_OUTPATIENT_CLINIC_OR_DEPARTMENT_OTHER): Payer: Self-pay

## 2023-09-23 ENCOUNTER — Encounter (HOSPITAL_BASED_OUTPATIENT_CLINIC_OR_DEPARTMENT_OTHER): Payer: Self-pay | Admitting: Cardiovascular Disease

## 2023-09-23 VITALS — BP 166/112 | HR 56 | Ht 66.5 in | Wt 263.8 lb

## 2023-09-23 DIAGNOSIS — E786 Lipoprotein deficiency: Secondary | ICD-10-CM | POA: Diagnosis not present

## 2023-09-23 DIAGNOSIS — Z5181 Encounter for therapeutic drug level monitoring: Secondary | ICD-10-CM

## 2023-09-23 DIAGNOSIS — I10 Essential (primary) hypertension: Secondary | ICD-10-CM | POA: Diagnosis not present

## 2023-09-23 DIAGNOSIS — I1A0 Resistant hypertension: Secondary | ICD-10-CM | POA: Diagnosis not present

## 2023-09-23 DIAGNOSIS — Z6841 Body Mass Index (BMI) 40.0 and over, adult: Secondary | ICD-10-CM | POA: Diagnosis not present

## 2023-09-23 DIAGNOSIS — E7849 Other hyperlipidemia: Secondary | ICD-10-CM | POA: Diagnosis not present

## 2023-09-23 MED ORDER — SPIRONOLACTONE 25 MG PO TABS
25.0000 mg | ORAL_TABLET | Freq: Every day | ORAL | 3 refills | Status: AC
  Filled 2023-09-23 (×2): qty 90, 90d supply, fill #0
  Filled 2024-01-09: qty 90, 90d supply, fill #1

## 2023-09-23 MED ORDER — SPIRONOLACTONE 25 MG PO TABS
25.0000 mg | ORAL_TABLET | Freq: Every day | ORAL | 3 refills | Status: DC
Start: 2023-09-23 — End: 2023-09-23
  Filled 2023-09-23: qty 90, 90d supply, fill #0

## 2023-09-23 MED ORDER — FLUZONE 0.5 ML IM SUSY
0.5000 mL | PREFILLED_SYRINGE | Freq: Once | INTRAMUSCULAR | 0 refills | Status: AC
  Filled 2023-09-23: qty 0.5, 1d supply, fill #0

## 2023-09-23 NOTE — Patient Instructions (Signed)
 Medication Instructions:  START SPIRONOLACTONE  25 MG DAILY   Labwork: RENIN/ALDOSTERONE TODAY   BMET IN 1 WEEK   Testing/Procedures: Your physician has requested that you have a renal artery duplex. During this test, an ultrasound is used to evaluate blood flow to the kidneys. Allow one hour for this exam. Do not eat after midnight the day before and avoid carbonated beverages. Take your medications as you usually do.  Follow-Up: 2 MONTHS WITH KRISTIN A PHARM D OR CAITLIN W NP   4 MONTHS WITH DR [facility]   Any Other Special Instructions Will Be Listed Below (If Applicable). MONITOR YOUR BLOOD PRESSURE TWICE A DAY, LOG IN THE BOOK PROVIDED. BRING THE BOOK AND YOUR BLOOD PRESSURE MACHINE TO YOUR FOLLOW UP   If you need a refill on your cardiac medications before your next appointment, please call your pharmacy.

## 2023-09-28 ENCOUNTER — Encounter (INDEPENDENT_AMBULATORY_CARE_PROVIDER_SITE_OTHER): Payer: Self-pay | Admitting: Internal Medicine

## 2023-09-28 ENCOUNTER — Ambulatory Visit (INDEPENDENT_AMBULATORY_CARE_PROVIDER_SITE_OTHER): Admitting: Internal Medicine

## 2023-09-28 VITALS — BP 180/100 | HR 57 | Temp 98.3°F | Ht 66.5 in | Wt 260.0 lb

## 2023-09-28 DIAGNOSIS — E66813 Obesity, class 3: Secondary | ICD-10-CM

## 2023-09-28 DIAGNOSIS — R7303 Prediabetes: Secondary | ICD-10-CM | POA: Diagnosis not present

## 2023-09-28 DIAGNOSIS — Z566 Other physical and mental strain related to work: Secondary | ICD-10-CM

## 2023-09-28 DIAGNOSIS — Z6841 Body Mass Index (BMI) 40.0 and over, adult: Secondary | ICD-10-CM

## 2023-09-28 DIAGNOSIS — Z0100 Encounter for examination of eyes and vision without abnormal findings: Secondary | ICD-10-CM | POA: Diagnosis not present

## 2023-09-28 DIAGNOSIS — I1A0 Resistant hypertension: Secondary | ICD-10-CM

## 2023-09-28 LAB — ALDOSTERONE + RENIN ACTIVITY W/ RATIO
Aldos/Renin Ratio: 44 — ABNORMAL HIGH (ref 0.0–30.0)
Aldosterone: 15.4 ng/dL (ref 0.0–30.0)
Renin Activity, Plasma: 0.35 ng/mL/h (ref 0.167–5.380)

## 2023-09-28 NOTE — Assessment & Plan Note (Addendum)
 Her blood pressure today is elevated she has not taking one of her new medications and will be doing so when she gets home.  She is currently on bisoprolol , hydrochlorothiazide , spironolactone  and valsartan .  She may benefit from anxiety management as I think this is driving her blood pressure or further evaluation for secondary causes.  I will defer this to primary care team.  Patient was encouraged to reach out to schedule an appointment as soon as available.  Patient advised on monitoring and we emphasized the importance of adherence.

## 2023-09-28 NOTE — Assessment & Plan Note (Signed)
 Patient is somewhat overwhelmed at present time she has not had the ability to focus on her weight loss journey.  We discussed avoiding all or none mindset and to still do some and focus on maintaining weight.  She self discontinued metformin  due to lack of perceived benefit and will remain off medication.  Other recommendations as above

## 2023-09-28 NOTE — Assessment & Plan Note (Signed)
 Chronic work-related stress and burnout due to high-pressure work environment and lack of support, affecting mental and physical health. Current work conditions are unsustainable, leading to increased stress and burnout. Freight forwarder (EAP) to discuss work-related stress and challenges. - Request time off and contact EAP program - Engage in stress-reducing activities such as exercise, deep breathing, and meditation. - Schedule an appointment with PCP to discuss stress and potential need for anti-anxiety medication like Lexapro. -Stress is significantly affecting blood pressure control.

## 2023-09-28 NOTE — Assessment & Plan Note (Signed)
 Patient has self discontinued metformin  for the time being.  Medication was started for pharmacoprophylaxis.  Consider restarting medication in the future.

## 2023-09-28 NOTE — Progress Notes (Addendum)
 Office: [phone]  /  Fax: [phone]  Weight Summary and Body Composition Analysis (BIA)  Vitals Temp: 98.7 F (37.1 C) BP: (!) 150/99 Pulse Rate: 69 SpO2: 100 %   Anthropometric Measurements Height: 5' 6.5 (1.689 m) Weight: 263 lb (119.3 kg) BMI (Calculated): 41.82 Weight at Last Visit: 260 lb Weight Lost Since Last Visit: 0 lb Weight Gained Since Last Visit: 3 lb Starting Weight: 258 lb Total Weight Loss (lbs): 0 lb (0 kg) Peak Weight: 272 lb   Body Composition  Body Fat %: 47.5 % Fat Mass (lbs): 125 lbs Muscle Mass (lbs): 131 lbs Total Body Water (lbs): 95.4 lbs Visceral Fat Rating : 14    RMR: 1751  Today's Visit #: 13  Starting Date: 04/12/22   Subjective   Chief Complaint: Obesity  Interval History Discussed the use of AI scribe software for clinical note transcription with the patient, who gave verbal consent to proceed.  History of Present Illness Christine Fields is a 43 year old female who presents for medical weight management.  Since last office visit she has gained 10 pounds.  She is following a 1500-calorie nutrition plan with fair adherence she is exercising 3 days a week 30 to 60 minutes doing cardio and strengthening.  She experiences significant work-related stress, which is emotionally and mentally draining, affecting her sleep and overall quality of life. 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  She is currently on three medications for hypertension, including nalsartan, which she takes at night. She confirms adherence to her medication regimen, except for nalsartan, which she plans to take later today.    Challenges affecting patient progress: moderate to high levels of stress.    Pharmacotherapy for weight management: She is currently taking she stopped taking metformin .   Assessment and Plan   Treatment Plan For Obesity:  Recommended  Dietary Goals  Christine Fields is currently in the action stage of change. As such, her goal is to continue weight management plan. She has agreed to: continue current plan and continue to work on implementation of reduced calorie nutrition plan (RCNP)  Behavioral Health and Counseling  We discussed the following behavioral modification strategies today: work on managing stress, creating time for self-care and relaxation, continue to work on maintaining a reduced calorie state, getting the recommended amount of protein, incorporating whole foods, making healthy choices, staying well hydrated and practicing mindfulness when eating., and increase protein intake, fibrous foods (25 grams per day for women, 30 grams for men) and water to improve satiety and decrease hunger signals. .  Additional education and resources provided today: None  Recommended Physical Activity Goals  Christine Fields has been advised to work up to 150 minutes of moderate intensity aerobic activity a week and strengthening exercises 2-3 times per week for cardiovascular health, weight loss maintenance and preservation of muscle mass.  She has agreed to :  Continue to gradually increase the amount and intensity of exercise routine  Medical Interventions and Pharmacotherapy  We discussed various medication options to help Christine Fields with her weight loss efforts and we both agreed to : Continue with current nutritional and behavioral strategies  Associated Conditions Impacted by Obesity Treatment  Assessment & Plan Work-related stress Chronic work-related stress and burnout due to high-pressure work environment and lack of support, affecting mental and physical health. Current work conditions are unsustainable, leading to increased stress and burnout. Freight forwarder (EAP)  to discuss work-related stress and challenges. - Request time off and contact EAP program - Engage in stress-reducing activities such as exercise,  deep breathing, and meditation. - Schedule an appointment with PCP to discuss stress and potential need for anti-anxiety medication like Lexapro. -Stress is significantly affecting blood pressure control. Resistant hypertension Her blood pressure today is elevated she has not taking one of her new medications and will be doing so when she gets home.  She is currently on bisoprolol , hydrochlorothiazide , spironolactone  and valsartan .  She may benefit from anxiety management as I think this is driving her blood pressure or further evaluation for secondary causes.  I will defer this to primary care team.  Patient was encouraged to reach out to schedule an appointment as soon as available.  Patient advised on monitoring and we emphasized the importance of adherence. Class 3 severe obesity with serious comorbidity and body mass index (BMI) of 40.0 to 44.9 in adult [facility]) Patient is somewhat overwhelmed at present time she has not had the ability to focus on her weight loss journey.  We discussed avoiding all or none mindset and to still do some and focus on maintaining weight.  She self discontinued metformin  due to lack of perceived benefit and will remain off medication.  Other recommendations as above Prediabetes Patient has self discontinued metformin  for the time being.  Medication was started for pharmacoprophylaxis.  Consider restarting medication in the future.          Objective   Physical Exam:  Blood pressure (!) 180/100, pulse (!) 57, temperature 98.3 F (36.8 C), height 5' 6.5 (1.689 m), weight 260 lb (117.9 kg), SpO2 97%. Body mass index is 41.34 kg/m.  General: She is overweight, cooperative, alert, well developed, and in no acute distress. PSYCH: Has normal mood, affect and thought process.   HEENT: EOMI, sclerae are anicteric. Lungs: Normal breathing effort, no conversational dyspnea. Extremities: No edema.  Neurologic: No gross sensory or motor deficits. No tremors or  fasciculations noted.    Diagnostic Data Reviewed:  BMET    Component Value Date/Time   NA 138 09/30/2023 1051   K 4.1 09/30/2023 1051   CL 101 09/30/2023 1051   CO2 22 09/30/2023 1051   GLUCOSE 109 (H) 09/30/2023 1051   GLUCOSE 108 (H) 07/11/2019 1124   BUN 11 09/30/2023 1051   CREATININE 0.82 09/30/2023 1051   CALCIUM  9.8 09/30/2023 1051   GFRNONAA 57 (L) 11/10/2019 1519   GFRAA 66 11/10/2019 1519   Lab Results  Component Value Date   HGBA1C 6.3 (H) 12/08/2022   HGBA1C 5.7 (H) 05/24/2017   Lab Results  Component Value Date   INSULIN  13.7 12/08/2022   INSULIN  18.4 05/03/2019   Lab Results  Component Value Date   TSH 3.860 09/07/2022   CBC    Component Value Date/Time   WBC 7.7 01/28/2022 1043   WBC 13.6 (H) 05/16/2011 2140   RBC 4.98 01/28/2022 1043   RBC 4.75 05/16/2011 2140   HGB 14.1 01/28/2022 1043   HCT 42.7 01/28/2022 1043   PLT 219 01/28/2022 1043   MCV 86 01/28/2022 1043   MCH 28.3 01/28/2022 1043   MCH 29.1 05/16/2011 2140   MCHC 33.0 01/28/2022 1043   MCHC 34.2 05/16/2011 2140   RDW 12.8 01/28/2022 1043   Iron Studies No results found for: IRON, TIBC, FERRITIN, IRONPCTSAT Lipid Panel     Component Value Date/Time   CHOL 169 08/24/2022 0847   TRIG 81 08/24/2022 0847  HDL 37 (L) 08/24/2022 0847   CHOLHDL 5.4 (H) 01/28/2022 1043   LDLCALC 117 (H) 08/24/2022 0847   LDLDIRECT 103 (H) 11/10/2019 1519   Hepatic Function Panel     Component Value Date/Time   PROT 6.6 12/08/2022 0855   ALBUMIN 4.4 12/08/2022 0855   AST 26 12/08/2022 0855   ALT 23 12/08/2022 0855   ALKPHOS 67 12/08/2022 0855   BILITOT 0.8 12/08/2022 0855      Component Value Date/Time   TSH 3.860 09/07/2022 1552   Nutritional Lab Results  Component Value Date   VD25OH 55.0 12/08/2022   VD25OH 36.3 08/24/2022   VD25OH 30.1 11/12/2020    Medications: Outpatient Encounter Medications as of 09/28/2023  Medication Sig   bisoprolol -hydrochlorothiazide  (ZIAC )  10-6.25 MG tablet Take 2 tablets by mouth daily.   desloratadine  (CLARINEX ) 5 MG tablet Take 1 tablet (5 mg total) by mouth daily.   levonorgestrel (MIRENA, 52 MG,) 20 MCG/24HR IUD    rizatriptan  (MAXALT ) 10 MG tablet Take 1 tablet (10 mg total) by mouth as needed for migraine, may repeat in 2 hours if needed   rosuvastatin  (CRESTOR ) 5 MG tablet Take 1 tablet (5 mg total) by mouth every other day.   spironolactone  (ALDACTONE ) 25 MG tablet Take 1 tablet (25 mg total) by mouth daily.   valsartan  (DIOVAN ) 320 MG tablet Take 1 tablet (320 mg total) by mouth daily.   [DISCONTINUED] metFORMIN  (GLUCOPHAGE ) 500 MG tablet Take 1 tablet (500 mg total) by mouth 2 (two) times daily with a meal. (Patient not taking: Reported on 09/28/2023)   No facility-administered encounter medications on file as of 09/28/2023.     Follow-Up   Return in about 4 weeks (around 10/26/2023) for For Weight Mangement with Dr. Francyne.SABRA She was informed of the importance of frequent follow up visits to maximize her success with intensive lifestyle modifications for her multiple health conditions.  Attestation Statement   Reviewed by clinician on day of visit: allergies, medications, problem list, medical history, surgical history, family history, social history, and previous encounter notes.     Lucas Francyne, MD

## 2023-09-30 ENCOUNTER — Ambulatory Visit: Admitting: Medical

## 2023-09-30 VITALS — BP 154/100 | HR 65 | Wt 262.2 lb

## 2023-09-30 DIAGNOSIS — R7303 Prediabetes: Secondary | ICD-10-CM | POA: Diagnosis not present

## 2023-09-30 DIAGNOSIS — Z5181 Encounter for therapeutic drug level monitoring: Secondary | ICD-10-CM | POA: Diagnosis not present

## 2023-09-30 DIAGNOSIS — F430 Acute stress reaction: Secondary | ICD-10-CM

## 2023-09-30 DIAGNOSIS — I10 Essential (primary) hypertension: Secondary | ICD-10-CM | POA: Diagnosis not present

## 2023-09-30 DIAGNOSIS — Z566 Other physical and mental strain related to work: Secondary | ICD-10-CM | POA: Diagnosis not present

## 2023-09-30 DIAGNOSIS — I1A0 Resistant hypertension: Secondary | ICD-10-CM

## 2023-09-30 NOTE — Progress Notes (Signed)
 Subjective:  Christine Fields is a 43 y.o. female who presents for Chief Complaint  Patient presents with   Follow-up    Follow-up on BP. Dr. Raford placed her on a 3rd bp med but went to [facility] and it was 177/100 due to stress.      Here for resistant hypertension and stress.   She saw cardiology a week ago for same.   At that time she was continued on Bisoprolol  HCT 10/6.25mg , 2 tablets daily, Valsartan  320mg  QHS, and spironolactone  25mg  added at that time.  It has been a week and her blood pressures are still high.  She saw healthy weight wellness clinic the other day and they recommended she do a follow-up primary care..  She is under a lot of stress.  She works as a Child psychotherapist at [employer].  She feels like she is ever burden, not enough resources to do her job, not enough other similar workers to do the job with heavy work load.  She does not feel like her supervisor is understanding or accommodating to get some kind of reprieve.  People talk hateful and nasty to her and she feels like she has no recourse.  She feels like she needs to take a little time off of work.  She reached out to employee assistance program yesterday to initiate counseling..  She has been working with efforts to get her weight under control with exercise and healthy eating habits.  She uses rest, walking her dogs and exercise to deal with stress  She feels burnt out at work in general  no other aggravating or relieving factors.    No other c/o.  Past Medical History:  Diagnosis Date   Alkaline phosphatase elevation 2021   Hypertension    Migraine    Pre-diabetes    Resistant hypertension 03/02/2017   Vitamin D  deficiency    Current Outpatient Medications on File Prior to Visit  Medication Sig Dispense Refill   bisoprolol -hydrochlorothiazide  (ZIAC ) 10-6.25 MG tablet Take 2 tablets by mouth daily. 180 tablet 2   desloratadine  (CLARINEX ) 5 MG tablet Take 1 tablet (5 mg total) by  mouth daily. 30 tablet 11   rizatriptan  (MAXALT ) 10 MG tablet Take 1 tablet (10 mg total) by mouth as needed for migraine, may repeat in 2 hours if needed 10 tablet 5   rosuvastatin  (CRESTOR ) 5 MG tablet Take 1 tablet (5 mg total) by mouth every other day. 90 tablet 2   spironolactone  (ALDACTONE ) 25 MG tablet Take 1 tablet (25 mg total) by mouth daily. 90 tablet 3   valsartan  (DIOVAN ) 320 MG tablet Take 1 tablet (320 mg total) by mouth daily. 90 tablet 2   levonorgestrel (MIRENA, 52 MG,) 20 MCG/24HR IUD      No current facility-administered medications on file prior to visit.    The following portions of the patient's history were reviewed and updated as appropriate: allergies, current medications, past family history, past medical history, past social history, past surgical history and problem list.  ROS Otherwise as in subjective above    Objective: BP (!) 154/100 (Cuff Size: Large)   Pulse 65   Wt 262 lb 3.2 oz (118.9 kg)   BMI 41.69 kg/m   BP Readings from Last 3 Encounters:  09/30/23 (!) 154/100  09/28/23 (!) 180/100  09/23/23 (!) 166/112   Wt Readings from Last 3 Encounters:  09/30/23 262 lb 3.2 oz (118.9 kg)  09/28/23 260 lb (117.9 kg)  09/23/23  263 lb 12.8 oz (119.7 kg)    General appearance: alert, no distress, well developed, well nourished Psych: pleasant, but seems stressed    Assessment: Encounter Diagnoses  Name Primary?   Acute stress reaction Yes   Resistant hypertension    Prediabetes    Work-related stress      Plan: We discussed her concerns.  She is pending a sleep study and renal ultrasound.  Continue current medications bisoprolol  HCT 10/6.25 mg 2 tablets daily, valsartan  320 mg at night and the recently added spironolactone  25 mg daily  Continue efforts with stress reduction.  Limit salt.  Continue exercise and working on healthy eating habits.  Continue efforts to lose weight  I wrote her out for a week to give her chance to deal with  stress and get things in order.  Advise she establish with counseling particularly if she thinks she may need to take longer period of time off of work.  We discussed some coping mechanisms to help with stress  Follow-up with counseling and cardiology as planned  Christine Fields was seen today for follow-up.  Diagnoses and all orders for this visit:  Acute stress reaction  Resistant hypertension  Prediabetes  Work-related stress    Follow up: with cardiology

## 2023-10-01 ENCOUNTER — Other Ambulatory Visit (HOSPITAL_COMMUNITY): Payer: Self-pay

## 2023-10-01 LAB — BASIC METABOLIC PANEL WITH GFR
BUN/Creatinine Ratio: 13 (ref 9–23)
BUN: 11 mg/dL (ref 6–24)
CO2: 22 mmol/L (ref 20–29)
Calcium: 9.8 mg/dL (ref 8.7–10.2)
Chloride: 101 mmol/L (ref 96–106)
Creatinine, Ser: 0.82 mg/dL (ref 0.57–1.00)
Glucose: 109 mg/dL — ABNORMAL HIGH (ref 70–99)
Potassium: 4.1 mmol/L (ref 3.5–5.2)
Sodium: 138 mmol/L (ref 134–144)
eGFR: 90 mL/min/1.73 (ref >59)

## 2023-10-04 ENCOUNTER — Encounter (HOSPITAL_COMMUNITY): Payer: Self-pay

## 2023-10-04 ENCOUNTER — Ambulatory Visit (HOSPITAL_COMMUNITY): Admission: RE | Admit: 2023-10-04 | Source: Ambulatory Visit

## 2023-10-11 ENCOUNTER — Ambulatory Visit (HOSPITAL_COMMUNITY)
Admission: RE | Admit: 2023-10-11 | Discharge: 2023-10-11 | Disposition: A | Discharge status: Home / Self Care | Source: Ambulatory Visit | Attending: Cardiovascular Disease | Admitting: Cardiovascular Disease

## 2023-10-11 DIAGNOSIS — I10 Essential (primary) hypertension: Secondary | ICD-10-CM | POA: Insufficient documentation

## 2023-10-12 ENCOUNTER — Ambulatory Visit: Admitting: Dermatology

## 2023-10-14 ENCOUNTER — Ambulatory Visit: Payer: Self-pay | Admitting: Cardiovascular Disease

## 2023-10-21 ENCOUNTER — Encounter (HOSPITAL_BASED_OUTPATIENT_CLINIC_OR_DEPARTMENT_OTHER)

## 2023-10-26 ENCOUNTER — Ambulatory Visit (INDEPENDENT_AMBULATORY_CARE_PROVIDER_SITE_OTHER): Admitting: Internal Medicine

## 2023-10-26 ENCOUNTER — Encounter (INDEPENDENT_AMBULATORY_CARE_PROVIDER_SITE_OTHER): Payer: Self-pay | Admitting: Internal Medicine

## 2023-10-26 ENCOUNTER — Other Ambulatory Visit (HOSPITAL_COMMUNITY): Payer: Self-pay

## 2023-10-26 VITALS — BP 150/99 | HR 69 | Temp 98.7°F | Ht 66.5 in | Wt 263.0 lb

## 2023-10-26 DIAGNOSIS — R7303 Prediabetes: Secondary | ICD-10-CM | POA: Diagnosis not present

## 2023-10-26 DIAGNOSIS — E66813 Obesity, class 3: Secondary | ICD-10-CM | POA: Diagnosis not present

## 2023-10-26 DIAGNOSIS — Z6841 Body Mass Index (BMI) 40.0 and over, adult: Secondary | ICD-10-CM | POA: Diagnosis not present

## 2023-10-26 DIAGNOSIS — I1A0 Resistant hypertension: Secondary | ICD-10-CM | POA: Diagnosis not present

## 2023-10-26 MED ORDER — METFORMIN HCL 500 MG PO TABS
500.0000 mg | ORAL_TABLET | Freq: Two times a day (BID) | ORAL | 0 refills | Status: AC
  Filled 2023-10-26: qty 180, 90d supply, fill #0

## 2023-10-26 NOTE — Progress Notes (Unsigned)
 Office: [phone]  /  Fax: [phone]  Weight Summary and Body Composition Analysis (BIA)  Vitals Temp: 98.7 F (37.1 C) BP: (!) 150/99 Pulse Rate: 69 SpO2: 100 %   Anthropometric Measurements Height: 5' 6.5 (1.689 m) Weight: 263 lb (119.3 kg) BMI (Calculated): 41.82 Weight at Last Visit: 260 lb Weight Lost Since Last Visit: 0 lb Weight Gained Since Last Visit: 3 lb Starting Weight: 258 lb Total Weight Loss (lbs): 0 lb (0 kg) Peak Weight: 272 lb   Body Composition  Body Fat %: 47.5 % Fat Mass (lbs): 125 lbs Muscle Mass (lbs): 131 lbs Total Body Water (lbs): 95.4 lbs Visceral Fat Rating : 14    RMR: 1751  Today's Visit #: 13  Starting Date: 04/12/22   Subjective   Chief Complaint: Obesity  Interval History Discussed the use of AI scribe software for clinical note transcription with the patient, who gave verbal consent to proceed.  History of Present Illness Christine Fields is a 43 year old female with prediabetes and hypertension who presents for medical weight management.  She is currently on spironolactone , hydrochlorothiazide , and valsartan  for hypertension. Her blood pressure was previously very high, but recent home measurements have shown improvement, with readings such as 130/90 mmHg. She notes that her blood pressure tends to be higher in medical settings. No headaches, which she used to have when her blood pressure was uncontrolled.  She has been prescribed metformin  for prediabetes but discontinued it due to concerns about potential side effects, including kidney and liver damage. She initially started the medication but stopped it due to these fears.  She has been following a 1500 calorie diet and was losing weight until recent events. She gained three pounds over the last two weekends due to dietary indulgences during a trip to [place]  and a homecoming celebration. She has since resumed her diet, consuming grilled chicken, boiled eggs, and  drinking 40 ounces of water daily. She also has her gym clothes ready for exercise.  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 She feels that her current work environment contributes to her stress and is motivated to make a change for her health and well-being.  In terms of her social history, she lives in [place] but is often in [place]. She does not consume energy drinks and limits her caffeine intake to one cup of coffee in the morning with 2% milk and two Splendas. She avoids high glycemic foods.     Challenges affecting patient progress: moderate to high levels of stress.    Pharmacotherapy for weight management: She is currently taking Metformin  (off label use for weight management and / or insulin  resistance and / or diabetes prevention) but not taking medication due to concerns about medication side effects and social media provoked fears.   Assessment and Plan   Treatment Plan For Obesity:  Recommended Dietary Goals  Zoey is currently in the action stage of change. As such, her goal is to continue weight management plan. She has agreed to: continue current plan  Behavioral Health and Counseling  We discussed the following behavioral modification strategies today: continue to work on maintaining a reduced calorie state, getting the recommended amount of protein, incorporating whole foods, making healthy choices, staying well hydrated and practicing mindfulness when eating. and increase protein intake, fibrous foods (25 grams per day  for women, 30 grams for men) and water to improve satiety and decrease hunger signals. .  Additional education and resources provided today: Handout on benefits and side effects of metformin   Recommended Physical Activity Goals  Paden has been advised to work up to 150 minutes of  moderate intensity aerobic activity a week and strengthening exercises 2-3 times per week for cardiovascular health, weight loss maintenance and preservation of muscle mass.  She has agreed to :  Think about enjoyable ways to increase daily physical activity and overcoming barriers to exercise, Increase physical activity in their day and reduce sedentary time (increase NEAT)., Increase volume of physical activity to a goal of 240 minutes a week, and Combine aerobic and strengthening exercises for efficiency and improved cardiometabolic health.  Medical Interventions and Pharmacotherapy  We discussed various medication options to help Blanch with her weight loss efforts and we both agreed to : Start metformin  XR 500 mg 1 tablet twice daily for diabetes prevention and weight management  Associated Conditions Impacted by Obesity Treatment  Assessment & Plan Prediabetes Prediabetes with focus on preventing progression to diabetes. Metformin  discussed as key medication for weight loss and diabetes risk reduction. Clarified metformin  safety, emphasizing it is safe in the absence of kidney disease. - Start metformin  500 mg twice daily - Encourage adherence to low glycemic diet Class 3 severe obesity with serious comorbidity and body mass index (BMI) of 40.0 to 44.9 in adult, unspecified obesity type (HCC) Class 3 obesity with recent weight gain of 3 pounds due to dietary indiscretions. Previously successful on a 1500 calorie diet. Metformin  discussed for weight loss, prediabetes management, and blood pressure control. Clarified metformin  safety, emphasizing it is safe unless there is existing liver or kidney disease. - Continue 1500 calorie diet - Start metformin  500 mg twice daily - Provide information on metformin  safety - Encourage adherence to low glycemic diet Resistant hypertension Patient seen by cardiology and medications have been adjusted secondary causes were evaluated.  Blood pressure in  the office elevated but states that her home blood pressure is improving.Essential hypertension with recent improvement in home blood pressure readings, though office readings remain elevated. Currently on spironolactone , bisoprolol , hydrochlorothiazide , and valsartan . Emphasized home blood pressure monitoring, especially in the morning and before bedtime, to ensure 24-hour control. Goal is maintaining blood pressure below 130/80 mmHg. Highlighted importance of aggressive management to prevent complications such as end-stage renal disease, particularly in minority populations - Continue current antihypertensive regimen - Monitor blood pressure at home in the morning and before bedtime - Discuss blood pressure readings with primary care or cardiologist for potential medication adjustments         Objective   Physical Exam:  Blood pressure (!) 150/99, pulse 69, temperature 98.7 F (37.1 C), height 5' 6.5 (1.689 m), weight 263 lb (119.3 kg), SpO2 100%. Body mass index is 41.81 kg/m.  General: She is overweight, cooperative, alert, well developed, and in no acute distress. PSYCH: Has normal mood, affect and thought process.   HEENT: EOMI, sclerae are anicteric. Lungs: Normal breathing effort, no conversational dyspnea. Extremities: No edema.  Neurologic: No gross sensory or motor deficits. No tremors or fasciculations noted.    Diagnostic Data Reviewed:  BMET    Component Value Date/Time   NA 138 09/30/2023 1051   K 4.1 09/30/2023 1051   CL 101 09/30/2023 1051   CO2 22 09/30/2023 1051   GLUCOSE 109 (H) 09/30/2023 1051   GLUCOSE 108 (H) 07/11/2019 1124   BUN 11  09/30/2023 1051   CREATININE 0.82 09/30/2023 1051   CALCIUM  9.8 09/30/2023 1051   GFRNONAA 57 (L) 11/10/2019 1519   GFRAA 66 11/10/2019 1519   Lab Results  Component Value Date   HGBA1C 6.3 (H) 12/08/2022   HGBA1C 5.7 (H) 05/24/2017   Lab Results  Component Value Date   INSULIN  13.7 12/08/2022   INSULIN  18.4  05/03/2019   Lab Results  Component Value Date   TSH 3.860 09/07/2022   CBC    Component Value Date/Time   WBC 7.7 01/28/2022 1043   WBC 13.6 (H) 05/16/2011 2140   RBC 4.98 01/28/2022 1043   RBC 4.75 05/16/2011 2140   HGB 14.1 01/28/2022 1043   HCT 42.7 01/28/2022 1043   PLT 219 01/28/2022 1043   MCV 86 01/28/2022 1043   MCH 28.3 01/28/2022 1043   MCH 29.1 05/16/2011 2140   MCHC 33.0 01/28/2022 1043   MCHC 34.2 05/16/2011 2140   RDW 12.8 01/28/2022 1043   Iron Studies No results found for: IRON, TIBC, FERRITIN, IRONPCTSAT Lipid Panel     Component Value Date/Time   CHOL 169 08/24/2022 0847   TRIG 81 08/24/2022 0847   HDL 37 (L) 08/24/2022 0847   CHOLHDL 5.4 (H) 01/28/2022 1043   LDLCALC 117 (H) 08/24/2022 0847   LDLDIRECT 103 (H) 11/10/2019 1519   Hepatic Function Panel     Component Value Date/Time   PROT 6.6 12/08/2022 0855   ALBUMIN 4.4 12/08/2022 0855   AST 26 12/08/2022 0855   ALT 23 12/08/2022 0855   ALKPHOS 67 12/08/2022 0855   BILITOT 0.8 12/08/2022 0855      Component Value Date/Time   TSH 3.860 09/07/2022 1552   Nutritional Lab Results  Component Value Date   VD25OH 55.0 12/08/2022   VD25OH 36.3 08/24/2022   VD25OH 30.1 11/12/2020    Medications: Outpatient Encounter Medications as of 10/26/2023  Medication Sig   bisoprolol -hydrochlorothiazide  (ZIAC ) 10-6.25 MG tablet Take 2 tablets by mouth daily.   desloratadine  (CLARINEX ) 5 MG tablet Take 1 tablet (5 mg total) by mouth daily.   levonorgestrel (MIRENA, 52 MG,) 20 MCG/24HR IUD    rizatriptan  (MAXALT ) 10 MG tablet Take 1 tablet (10 mg total) by mouth as needed for migraine, may repeat in 2 hours if needed   rosuvastatin  (CRESTOR ) 5 MG tablet Take 1 tablet (5 mg total) by mouth every other day.   spironolactone  (ALDACTONE ) 25 MG tablet Take 1 tablet (25 mg total) by mouth daily.   valsartan  (DIOVAN ) 320 MG tablet Take 1 tablet (320 mg total) by mouth daily.   metFORMIN  (GLUCOPHAGE )  500 MG tablet Take 1 tablet (500 mg total) by mouth 2 (two) times daily with a meal.   No facility-administered encounter medications on file as of 10/26/2023.     Follow-Up   Return in about 4 weeks (around 11/23/2023) for For Weight Mangement with Dr. Francyne.SABRA She was informed of the importance of frequent follow up visits to maximize her success with intensive lifestyle modifications for her multiple health conditions.  Attestation Statement   Reviewed by clinician on day of visit: allergies, medications, problem list, medical history, surgical history, family history, social history, and previous encounter notes.     Lucas Francyne, MD

## 2023-10-27 NOTE — Assessment & Plan Note (Signed)
 Class 3 obesity with recent weight gain of 3 pounds due to dietary indiscretions. Previously successful on a 1500 calorie diet. Metformin  discussed for weight loss, prediabetes management, and blood pressure control. Clarified metformin  safety, emphasizing it is safe unless there is existing liver or kidney disease. - Continue 1500 calorie diet - Start metformin  500 mg twice daily - Provide information on metformin  safety - Encourage adherence to low glycemic diet

## 2023-10-27 NOTE — Assessment & Plan Note (Signed)
 Patient seen by cardiology and medications have been adjusted secondary causes were evaluated.  Blood pressure in the office elevated but states that her home blood pressure is improving.Essential hypertension with recent improvement in home blood pressure readings, though office readings remain elevated. Currently on spironolactone , bisoprolol , hydrochlorothiazide , and valsartan . Emphasized home blood pressure monitoring, especially in the morning and before bedtime, to ensure 24-hour control. Goal is maintaining blood pressure below 130/80 mmHg. Highlighted importance of aggressive management to prevent complications such as end-stage renal disease, particularly in minority populations - Continue current antihypertensive regimen - Monitor blood pressure at home in the morning and before bedtime - Discuss blood pressure readings with primary care or cardiologist for potential medication adjustments

## 2023-10-27 NOTE — Assessment & Plan Note (Signed)
 Prediabetes with focus on preventing progression to diabetes. Metformin  discussed as key medication for weight loss and diabetes risk reduction. Clarified metformin  safety, emphasizing it is safe in the absence of kidney disease. - Start metformin  500 mg twice daily - Encourage adherence to low glycemic diet

## 2023-10-29 DIAGNOSIS — Z1231 Encounter for screening mammogram for malignant neoplasm of breast: Secondary | ICD-10-CM | POA: Diagnosis not present

## 2023-11-25 ENCOUNTER — Ambulatory Visit (INDEPENDENT_AMBULATORY_CARE_PROVIDER_SITE_OTHER): Payer: Self-pay | Admitting: Internal Medicine

## 2023-11-30 NOTE — Progress Notes (Signed)
 [facility]pertension [facility] Assessment:    Date:  12/04/2023   ID:  Christine Fields, DOB Jun 08, 1980, MRN [id number]  PCP:  Bulah Alm RAMAN, PA-C  Cardiologist:  Shelda Bruckner, MD  Nephrologist:  Referring MD: Bulah Alm RAMAN, PA-C   CC: Hypertension  History of Present Illness:    Christine Fields is a 43 y.o. female with a hx of hypertension, prediabetes, hyperlipidemia, obesity here to follow up in the [facility].   Established with [facility] 09/23/23.  Previous eval by cardiology Dr. Janell 05/2022 with BP well-controlled on bisoprolol -HCTZ and valsartan .  Her hypertension was initially diagnosed 5 years prior.  Noted prior intolerance to amlodipine  with headache and HCTZ alone led to frequent urination.  She noted often forgetting her afternoon dose of valsartan .  Family history notable for hypertension in her father with stroke, paternal grandmother with MI at 64.   At initial visit she was recommended for home sleep study. Renal artery duplex 09/2023 with no renal artery stenosis. Renin-aldosterone indeterminate for hyperaldosteronism with renin 0.35, aldosterone 15.4. Spironolactone  25 mg daily initiated.  Valsartan  moved to evening dosing for easier adherence. Discussion of calcium  score deferred due to need for multiple other tests.   Presents today for follow up independently. Notes more stressors since last seen. She works full time and is in a engineer, building services for social work. Blood pressure at home mostly 130s/80s. Of note did not take her Valsartan  last night due to spending the night with her father in the hospital. She wants to modify her diet to further lower her blood pressure. Mostly eating on the go right now. Does not feel  she has time/capacity to cook presently.   The 10-year ASCVD risk score (Arnett DK, et al., 2019) is: 1.7%   Values used to calculate the score:     Age: 24 years     Clincally relevant sex:  Female     Is Non-Hispanic African American: No     Diabetic: No     Tobacco smoker: No     Systolic Blood Pressure: 136 mmHg     Is BP treated: Yes     HDL Cholesterol: 37 mg/dL     Total Cholesterol: 169 mg/dL   Previous antihypertensives: Amlodipine -Headache  Past Medical History:  Diagnosis Date   Alkaline phosphatase elevation 2021   Hypertension    Migraine    Pre-diabetes    Resistant hypertension 03/02/2017   Vitamin D  deficiency     Past Surgical History:  Procedure Laterality Date   CESAREAN SECTION     KNEE ARTHROSCOPY Right 07/13/2019   Procedure: RIGHT KNEE ARTHROSCOPY PARTIAL MEDIAL MENISCECTOMY MEDIAL CHONDROPLASTY;  Surgeon: Sheril Coy, MD;  Location: [facility];  Service: Orthopedics;  Laterality: Right;    Current Medications: Current Meds  Medication Sig   bisoprolol -hydrochlorothiazide  (ZIAC ) 10-6.25 MG tablet Take 2 tablets by mouth daily.   desloratadine  (CLARINEX ) 5 MG tablet Take 1 tablet (5 mg total) by mouth daily. (Patient taking differently: Take 5 mg by mouth daily as needed (allergies).)   levonorgestrel (MIRENA, 52 MG,) 20 MCG/24HR IUD    rizatriptan  (MAXALT ) 10 MG tablet Take 1 tablet (10 mg total) by mouth as needed for migraine, may repeat in 2 hours if needed   spironolactone  (ALDACTONE ) 25 MG tablet Take 1 tablet (25 mg total) by mouth daily.   valsartan  (DIOVAN ) 320 MG tablet Take 1 tablet (320 mg total) by mouth daily.  Allergies:   Amlodipine  and Kiwi extract   Social History   Socioeconomic History   Marital status: Divorced    Spouse name: Not on file   Number of children: Not on file   Years of education: Not on file   Highest education level: Doctorate  Occupational History   Occupation: Administrator, Civil Service  Tobacco Use   Smoking status: Never    Passive exposure: Never   Smokeless tobacco: Never  Vaping Use   Vaping status: Never Used  Substance and Sexual Activity   Alcohol use:  Not Currently    Alcohol/week: 1.0 standard drink of alcohol    Types: 1 Glasses of wine per week    Comment: occasionally   Drug use: No   Sexual activity: Not Currently  Other Topics Concern   Not on file  Social History Narrative   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   Social Drivers of Health   Financial Resource Strain: Low Risk  (09/23/2023)   Overall Financial Resource Strain (CARDIA)    Difficulty of Paying Living Expenses: Not hard at all  Food Insecurity: No Food Insecurity (12/02/2023)   Hunger Vital Sign    Worried About Running Out of Food in the Last Year: Never true    Ran Out of Food in the Last Year: Never true  Transportation Needs: No Transportation Needs (03/09/2023)   PRAPARE - Administrator, Civil Service (Medical): No    Lack of Transportation (Non-Medical): No  Physical Activity: Sufficiently Active (03/09/2023)   Exercise Vital Sign    Days of Exercise per Week: 4 days    Minutes of Exercise per Session: 40 min  Stress: No Stress Concern Present (09/23/2023)   [employer] of Occupational Health - Occupational Stress Questionnaire    Feeling of Stress: Only a little  Social Connections: Moderately Integrated (03/09/2023)   Social Connection and Isolation Panel    Frequency of Communication with Friends and Family: More than three times a week    Frequency of Social Gatherings with Friends and Family: Patient declined    Attends Religious Services: More than 4 times per year    Active Member of [employer] or Organizations: Yes    Attends Engineer, Structural: 1 to 4 times per year    Marital Status: Divorced     Family History: The patient's family history includes Cancer in her maternal grandfather; Dementia in her father; Diabetes in her father and maternal grandfather; Heart attack (age of onset: 38) in her paternal grandmother; Hyperlipidemia in her father; Hypertension in her father, maternal  grandmother, and paternal grandmother; Stroke in her father.  ROS:   Please see the history of present illness.     All other systems reviewed and are negative.  EKGs/Labs/Other Studies Reviewed:         Recent Labs: 12/08/2022: ALT 23 09/30/2023: BUN 11; Creatinine, Ser 0.82; Potassium 4.1; Sodium 138   Recent Lipid Panel    Component Value Date/Time   CHOL 169 08/24/2022 0847   TRIG 81 08/24/2022 0847   HDL 37 (L) 08/24/2022 0847   CHOLHDL 5.4 (H) 01/28/2022 1043   LDLCALC 117 (H) 08/24/2022 0847   LDLDIRECT 103 (H) 11/10/2019 1519    Physical Exam:   VS:  BP 136/88 (BP Location: Right Arm, Patient Position: Sitting, Cuff Size: Large)   Pulse 69   Ht 5' 6.5 (1.689 m)   Wt 268 lb (121.6 kg)  SpO2 97%   BMI 42.61 kg/m  , BMI Body mass index is 42.61 kg/m. GENERAL:  Well appearing HEENT: Pupils equal round and reactive, fundi not visualized, oral mucosa unremarkable NECK:  No jugular venous distention, waveform within normal limits, carotid upstroke brisk and symmetric, no bruits, no thyromegaly LYMPHATICS:  No cervical adenopathy LUNGS:  Clear to auscultation bilaterally HEART:  RRR.  PMI not displaced or sustained,S1 and S2 within normal limits, no S3, no S4, no clicks, no rubs, no murmurs ABD:  Flat, positive bowel sounds normal in frequency in pitch, no bruits, no rebound, no guarding, no midline pulsatile mass, no hepatomegaly, no splenomegaly EXT:  2 plus pulses throughout, no edema, no cyanosis no clubbing SKIN:  No rashes no nodules NEURO:  Cranial nerves II through XII grossly intact, motor grossly intact throughout PSYCH:  Cognitively intact, oriented to person place and time   ASSESSMENT/PLAN:    HTN - BP nearly at goal <130/80. Prefers to focus on lifestyle changes. Continue bisoprolol -hydrochlorothiazide  10-6.25mg  two tablets daily, spironolactone  25mg  daily, valsartan  320mg  at bedtime.  Discussed to monitor BP at home at least 2 hours after medications  and sitting for 5-10 minutes.  Recommend aiming for 150 minutes of moderate intensity activity per week and following a heart healthy diet.   Labs indeterminate for hyperaldosteronism. As she is now on Spironolactone , will defer further testing.   Prediabetes - Continue to follow with PCP.   Venous insufficiency - leg elevation, low sodium diet encouraged. No significant edema appreciated on exam today. Improved with addition of spironolactone .   Suspected OSA - await prior auth for Itamar home sleep study.   HLD - Continue Rousvastatin 5mg  daily.   Family history of premature CAD - Stable with no anginal symptoms. Recommend aiming for 150 minutes of moderate intensity activity per week and following a heart healthy diet.  . Consider discussion of coronary calcium  score at follow up.   Screening for Secondary Hypertension:     09/23/2023    9:00 AM  Causes  Drugs/Herbals Screened     - Comments limits caffeine, watches salt. rare NSAIDS. No tobacco.  Rare EtOH  Renovascular HTN Screened     - Comments check renal Dopplers.  Sleep Apnea Screened     - Comments ?snoring.  +daytime somnolence.  Check Itamar  Thyroid  Disease Screened  Hyperaldosteronism Screened     - Comments check renin/aldo  Pheochromocytoma N/A  Cushing's Syndrome N/A  Hyperparathyroidism Screened  Coarctation of the Aorta Screened     - Comments BP symmetric  Compliance Screened    Relevant Labs/Studies:    Latest Ref Rng & Units 09/30/2023   10:51 AM 12/08/2022    8:55 AM 08/24/2022    8:47 AM  Basic Labs  Sodium 134 - 144 mmol/L 138  139  137   Potassium 3.5 - 5.2 mmol/L 4.1  4.2  3.9   Creatinine 0.57 - 1.00 mg/dL 9.17  9.22  9.25        Latest Ref Rng & Units 09/07/2022    3:52 PM 08/24/2022    8:47 AM  Thyroid    TSH 0.450 - 4.500 uIU/mL 3.860  4.660        Latest Ref Rng & Units 09/23/2023    9:59 AM  Renin/Aldosterone   Aldosterone 0.0 - 30.0 ng/dL 84.5   Aldos/Renin Ratio 0.0 - 30.0 44.0               10/11/2023   11:38 AM  Renovascular   Renal Artery US  Completed Yes    Disposition:    FU with MD/APP/PharmD in 2-3 months    Medication Adjustments/Labs and Tests Ordered: Current medicines are reviewed at length with the patient today.  Concerns regarding medicines are outlined above.  No orders of the defined types were placed in this encounter.  No orders of the defined types were placed in this encounter.    Signed, Reche GORMAN Finder, NP  12/04/2023 10:40 AM    [facility]

## 2023-12-02 ENCOUNTER — Encounter (HOSPITAL_BASED_OUTPATIENT_CLINIC_OR_DEPARTMENT_OTHER): Payer: Self-pay | Admitting: Family

## 2023-12-02 ENCOUNTER — Ambulatory Visit (HOSPITAL_BASED_OUTPATIENT_CLINIC_OR_DEPARTMENT_OTHER): Admitting: Family

## 2023-12-02 VITALS — BP 136/88 | HR 69 | Ht 66.5 in | Wt 268.0 lb

## 2023-12-02 DIAGNOSIS — I10 Essential (primary) hypertension: Secondary | ICD-10-CM

## 2023-12-02 DIAGNOSIS — E782 Mixed hyperlipidemia: Secondary | ICD-10-CM

## 2023-12-02 NOTE — Patient Instructions (Signed)
 Medication Instructions:  Resume Rosuvastatin  5mg  daily   Continue your current blood pressure medications   Follow-Up: Please follow up in 2-3 months in [facility]IN[facility] with Dr. Raford, Reche Finder, NP or Allean Mink PharmD    Special Instructions:   Heart Healthy Diet Recommendations: A low-salt diet is recommended. Meats should be grilled, baked, or boiled. Avoid fried foods. Focus on lean protein sources like fish or chicken with vegetables and fruits. The American Heart Association is a Chief Technology Officer!  American Heart Association Diet and Lifeystyle Recommendations   Exercise recommendations: The American Heart Association recommends 150 minutes of moderate intensity exercise weekly. Try 30 minutes of moderate intensity exercise 4-5 times per week. This could include walking, jogging, or swimming.

## 2023-12-30 DIAGNOSIS — I10 Essential (primary) hypertension: Secondary | ICD-10-CM | POA: Diagnosis not present

## 2023-12-30 DIAGNOSIS — Z30432 Encounter for removal of intrauterine contraceptive device: Secondary | ICD-10-CM | POA: Diagnosis not present

## 2023-12-30 DIAGNOSIS — Z01419 Encounter for gynecological examination (general) (routine) without abnormal findings: Secondary | ICD-10-CM | POA: Diagnosis not present

## 2024-01-09 ENCOUNTER — Other Ambulatory Visit: Payer: Self-pay | Admitting: Medical

## 2024-01-10 ENCOUNTER — Other Ambulatory Visit: Payer: Self-pay

## 2024-01-10 ENCOUNTER — Other Ambulatory Visit (HOSPITAL_COMMUNITY): Payer: Self-pay

## 2024-01-10 MED ORDER — VALSARTAN 320 MG PO TABS
320.0000 mg | ORAL_TABLET | Freq: Every day | ORAL | 2 refills | Status: AC
  Filled 2024-01-10: qty 90, 90d supply, fill #0

## 2024-01-11 ENCOUNTER — Other Ambulatory Visit (HOSPITAL_COMMUNITY): Payer: Self-pay

## 2024-02-09 NOTE — Progress Notes (Unsigned)
 "  Advanced Hypertension Clinic Assessment:    Date:  02/09/2024   ID:  Christine Fields, DOB 1980-08-20, MRN [id number]  PCP:  Bulah Alm RAMAN, PA-C  Cardiologist:  Shelda Bruckner, MD  Nephrologist:  Referring MD: Bulah Alm RAMAN, PA-C   CC: Hypertension  History of Present Illness:    Christine Fields is a 44 y.o. female with a hx of hypertension, prediabetes, hyperlipidemia, obesity here to follow up in the Advanced Hypertension Clinic.   Established with [facility]pertension Clinic 09/23/23.  Previous eval by cardiology Dr. Janell 05/2022 with BP well-controlled on bisoprolol -HCTZ and valsartan .  Her hypertension was initially diagnosed 5 years prior.  Noted prior intolerance to amlodipine  with headache and HCTZ alone led to frequent urination.  She noted often forgetting her afternoon dose of valsartan .  Family history notable for hypertension in her father with stroke, paternal grandmother with MI at 22.   At initial visit she was recommended for home sleep study. Renal artery duplex 09/2023 with no renal artery stenosis. Renin-aldosterone indeterminate for hyperaldosteronism with renin 0.35, aldosterone 15.4. Spironolactone  25 mg daily initiated.  Valsartan  moved to evening dosing for easier adherence. Discussion of calcium  score deferred due to need for multiple other tests.   At visit 12/02/23 her BP was nearly at goal with clinic reading 136/88. She preferred to work on lifestyle changes as she had recent stressors interrupting healthy choices such as her father being in the hospital, working on engineer, building services for social work.   Presents today for follow up. ***   The 10-year ASCVD risk score (Arnett DK, et al., 2019) is: 1.7%   Values used to calculate the score:     Age: 54 years     Clinically relevant sex: Female     Is Non-Hispanic African American: No     Diabetic: No     Tobacco smoker: No     Systolic Blood Pressure: 136 mmHg     Is BP treated: Yes      HDL Cholesterol: 37 mg/dL     Total Cholesterol: 169 mg/dL   Previous antihypertensives: Amlodipine -Headache  Past Medical History:  Diagnosis Date   Alkaline phosphatase elevation 2021   Hypertension    Migraine    Pre-diabetes    Resistant hypertension 03/02/2017   Vitamin D  deficiency     Past Surgical History:  Procedure Laterality Date   CESAREAN SECTION     KNEE ARTHROSCOPY Right 07/13/2019   Procedure: RIGHT KNEE ARTHROSCOPY PARTIAL MEDIAL MENISCECTOMY MEDIAL CHONDROPLASTY;  Surgeon: Sheril Coy, MD;  Location: [facility];  Service: Orthopedics;  Laterality: Right;    Current Medications: No outpatient medications have been marked as taking for the 02/10/24 encounter (Appointment) with Christine Reche RAMAN, NP.     Allergies:   Amlodipine  and Kiwi extract   Social History   Socioeconomic History   Marital status: Divorced    Spouse name: Not on file   Number of children: Not on file   Years of education: Not on file   Highest education level: Doctorate  Occupational History   Occupation: Administrator, Civil Service  Tobacco Use   Smoking status: Never    Passive exposure: Never   Smokeless tobacco: Never  Vaping Use   Vaping status: Never Used  Substance and Sexual Activity   Alcohol use: Not Currently    Alcohol/week: 1.0 standard drink of alcohol    Types: 1 Glasses of wine per week    Comment: occasionally  Drug use: No   Sexual activity: Not Currently  Other Topics Concern   Not on file  Social History Narrative   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   Social Drivers of Health   Tobacco Use: Low Risk (12/02/2023)   Patient History    Smoking Tobacco Use: Never    Smokeless Tobacco Use: Never    Passive Exposure: Never  Financial Resource Strain: Low Risk (09/23/2023)   Overall Financial Resource Strain (CARDIA)    Difficulty of Paying Living Expenses: Not hard at all  Food Insecurity: No  Food Insecurity (12/02/2023)   Epic    Worried About Programme Researcher, Broadcasting/film/video in the Last Year: Never true    Ran Out of Food in the Last Year: Never true  Transportation Needs: No Transportation Needs (03/09/2023)   PRAPARE - Administrator, Civil Service (Medical): No    Lack of Transportation (Non-Medical): No  Physical Activity: Sufficiently Active (03/09/2023)   Exercise Vital Sign    Days of Exercise per Week: 4 days    Minutes of Exercise per Session: 40 min  Stress: No Stress Concern Present (09/23/2023)   [employer] of Occupational Health - Occupational Stress Questionnaire    Feeling of Stress: Only a little  Social Connections: Moderately Integrated (03/09/2023)   Social Connection and Isolation Panel    Frequency of Communication with Friends and Family: More than three times a week    Frequency of Social Gatherings with Friends and Family: Patient declined    Attends Religious Services: More than 4 times per year    Active Member of Clubs or Organizations: Yes    Attends Banker Meetings: 1 to 4 times per year    Marital Status: Divorced  Depression (PHQ2-9): Low Risk (03/10/2023)   Depression (PHQ2-9)    PHQ-2 Score: 0  Alcohol Screen: Low Risk (03/09/2023)   Alcohol Screen    Last Alcohol Screening Score (AUDIT): 1  Housing: Low Risk (09/23/2023)   Epic    Unable to Pay for Housing in the Last Year: No    Number of Times Moved in the Last Year: 0    Homeless in the Last Year: No  Utilities: Not At Risk (09/23/2023)   Epic    Threatened with loss of utilities: No  Health Literacy: Adequate Health Literacy (09/23/2023)   B1300 Health Literacy    Frequency of need for help with medical instructions: Never     Family History: The patient's family history includes Cancer in her maternal grandfather; Dementia in her father; Diabetes in her father and maternal grandfather; Heart attack (age of onset: 75) in her paternal grandmother; Hyperlipidemia  in her father; Hypertension in her father, maternal grandmother, and paternal grandmother; Stroke in her father.  ROS:   Please see the history of present illness.     All other systems reviewed and are negative.  EKGs/Labs/Other Studies Reviewed:         Recent Labs: 09/30/2023: BUN 11; Creatinine, Ser 0.82; Potassium 4.1; Sodium 138   Recent Lipid Panel    Component Value Date/Time   CHOL 169 08/24/2022 0847   TRIG 81 08/24/2022 0847   HDL 37 (L) 08/24/2022 0847   CHOLHDL 5.4 (H) 01/28/2022 1043   LDLCALC 117 (H) 08/24/2022 0847   LDLDIRECT 103 (H) 11/10/2019 1519    Physical Exam:   VS:  There were no vitals taken for this visit. , BMI There is  no height or weight on file to calculate BMI. GENERAL:  Well appearing HEENT: Pupils equal round and reactive, fundi not visualized, oral mucosa unremarkable NECK:  No jugular venous distention, waveform within normal limits, carotid upstroke brisk and symmetric, no bruits, no thyromegaly LYMPHATICS:  No cervical adenopathy LUNGS:  Clear to auscultation bilaterally HEART:  RRR.  PMI not displaced or sustained,S1 and S2 within normal limits, no S3, no S4, no clicks, no rubs, no murmurs ABD:  Flat, positive bowel sounds normal in frequency in pitch, no bruits, no rebound, no guarding, no midline pulsatile mass, no hepatomegaly, no splenomegaly EXT:  2 plus pulses throughout, no edema, no cyanosis no clubbing SKIN:  No rashes no nodules NEURO:  Cranial nerves II through XII grossly intact, motor grossly intact throughout PSYCH:  Cognitively intact, oriented to person place and time   ASSESSMENT/PLAN:    HTN - BP nearly at goal <130/80. Prefers to focus on lifestyle changes. Continue bisoprolol -hydrochlorothiazide  10-6.25mg  two tablets daily, spironolactone  25mg  daily, valsartan  320mg  at bedtime.  Discussed to monitor BP at home at least 2 hours after medications and sitting for 5-10 minutes.  Recommend aiming for 150 minutes of  moderate intensity activity per week and following a heart healthy diet.   Labs indeterminate for hyperaldosteronism. As she is now on Spironolactone , will defer further testing. ***  Prediabetes - Continue to follow with PCP. ***  Venous insufficiency - leg elevation, low sodium diet encouraged. No significant edema appreciated on exam today. Improved with addition of spironolactone . ***  Suspected OSA - await prior auth for Itamar home sleep study. ***  HLD - Continue Rousvastatin 5mg  daily. ***  Family history of premature CAD - Stable with no anginal symptoms. Recommend aiming for 150 minutes of moderate intensity activity per week and following a heart healthy diet.  . Consider discussion of coronary calcium  score at follow up. ***  Screening for Secondary Hypertension:     09/23/2023    9:00 AM  Causes  Drugs/Herbals Screened     - Comments limits caffeine, watches salt. rare NSAIDS. No tobacco.  Rare EtOH  Renovascular HTN Screened     - Comments check renal Dopplers.  Sleep Apnea Screened     - Comments ?snoring.  +daytime somnolence.  Check Itamar  Thyroid  Disease Screened  Hyperaldosteronism Screened     - Comments check renin/aldo  Pheochromocytoma N/A  Cushing's Syndrome N/A  Hyperparathyroidism Screened  Coarctation of the Aorta Screened     - Comments BP symmetric  Compliance Screened    Relevant Labs/Studies:    Latest Ref Rng & Units 09/30/2023   10:51 AM 12/08/2022    8:55 AM 08/24/2022    8:47 AM  Basic Labs  Sodium 134 - 144 mmol/L 138  139  137   Potassium 3.5 - 5.2 mmol/L 4.1  4.2  3.9   Creatinine 0.57 - 1.00 mg/dL 9.17  9.22  9.25        Latest Ref Rng & Units 09/07/2022    3:52 PM 08/24/2022    8:47 AM  Thyroid    TSH 0.450 - 4.500 uIU/mL 3.860  4.660        Latest Ref Rng & Units 09/23/2023    9:59 AM  Renin/Aldosterone   Aldosterone 0.0 - 30.0 ng/dL 84.5   Aldos/Renin Ratio 0.0 - 30.0 44.0              10/11/2023   11:38 AM   Renovascular   Renal Artery US   Completed Yes    Disposition:    FU with MD/APP/PharmD in *** months    Medication Adjustments/Labs and Tests Ordered: Current medicines are reviewed at length with the patient today.  Concerns regarding medicines are outlined above.  No orders of the defined types were placed in this encounter.  No orders of the defined types were placed in this encounter.    Signed, Reche GORMAN Finder, NP  02/09/2024 8:00 PM    [facility] "

## 2024-02-10 ENCOUNTER — Encounter (HOSPITAL_BASED_OUTPATIENT_CLINIC_OR_DEPARTMENT_OTHER): Admitting: Family

## 2024-02-11 ENCOUNTER — Telehealth: Payer: Self-pay | Admitting: *Deleted

## 2024-02-11 NOTE — Telephone Encounter (Signed)
-----   Message from Reche Finder, NP sent at 02/11/2024  2:12 PM EST ----- Regarding: RE: [facility] ordered 09/23/23 status? Thank you,   If ordered is there a typical timeline we should expect patient to hear by? I.e. if they have not heard within 4 weeks contact company directly at that number? ----- Message ----- From: Joshua Dalton MATSU, CMA Sent: 02/11/2024   1:50 PM EST To: Reche GORMAN Finder, NP Subject: RE: [facility] ordered 09/23/23 status?             All studies ordered after 08/06/23 will be precerted and shipped to the patient from the [employer] company. I see the patient does have an account in [facility]. I have provided the number to call the company directly [phone] opt.2.   Thanks, Brad Joshua, CMA Sleep Coordinator [phone]. ----- Message ----- From: Finder Reche GORMAN, NP Sent: 02/09/2024   8:04 PM EST To: Dalton MATSU Joshua, CMA; Avelina HERO Via, LPN; # Subject: [facility] ordered 09/23/23 status?                 Hi all,   Miss Chilton has a follow up with me 02/10/24. Her [employer] was ordered 09/23/23. Do we have an update on the approval status? Untreated sleep apnea could be making her hypertension more difficult to control.  Thank you,  Reche GORMAN Finder, NP

## 2024-02-11 NOTE — Telephone Encounter (Signed)
 All studies ordered after 08/06/23 will be precerted and shipped to the patient from the [employer] company. I see the patient does have an account in [facility]. I have provided the number to call the company directly [phone] opt.2.

## 2024-02-11 NOTE — Telephone Encounter (Signed)
 Yes, that should be fine.

## 2024-02-11 NOTE — Telephone Encounter (Signed)
-----   Message from Reche Finder, NP sent at 02/09/2024  8:02 PM EST ----- Regarding: [facility] ordered 09/23/23 status? Hi all,   Miss Gattuso has a follow up with me 02/10/24. Her [facility] was ordered 09/23/23. Do we have an update on the approval status? Untreated sleep apnea could be making her hypertension more difficult to control.  Thank you,  Reche GORMAN Finder, NP

## 2024-02-15 ENCOUNTER — Other Ambulatory Visit: Payer: Self-pay

## 2024-03-20 ENCOUNTER — Encounter: Payer: Commercial Managed Care - PPO | Admitting: Medical
